# Patient Record
Sex: Female | Born: 1935 | Race: White | Hispanic: No | Smoking: Never smoker
Health system: Southern US, Community
[De-identification: ages and names within clinical notes are randomized; demographics above are authoritative.]

## PROBLEM LIST (undated history)

## (undated) DIAGNOSIS — K5790 Diverticulosis of intestine, part unspecified, without perforation or abscess without bleeding: Secondary | ICD-10-CM

## (undated) DIAGNOSIS — J302 Other seasonal allergic rhinitis: Secondary | ICD-10-CM

## (undated) DIAGNOSIS — I10 Essential (primary) hypertension: Secondary | ICD-10-CM

## (undated) DIAGNOSIS — M810 Age-related osteoporosis without current pathological fracture: Secondary | ICD-10-CM

## (undated) DIAGNOSIS — C801 Malignant (primary) neoplasm, unspecified: Secondary | ICD-10-CM

## (undated) DIAGNOSIS — H409 Unspecified glaucoma: Secondary | ICD-10-CM

## (undated) HISTORY — DX: Age-related osteoporosis without current pathological fracture: M81.0

## (undated) HISTORY — DX: Essential (primary) hypertension: I10

## (undated) HISTORY — PX: CATARACT EXTRACTION, BILATERAL: SHX1313

## (undated) HISTORY — PX: SKIN CANCER EXCISION: SHX779

## (undated) HISTORY — DX: Other seasonal allergic rhinitis: J30.2

## (undated) HISTORY — DX: Diverticulosis of intestine, part unspecified, without perforation or abscess without bleeding: K57.90

## (undated) HISTORY — DX: Malignant (primary) neoplasm, unspecified: C80.1

## (undated) HISTORY — DX: Unspecified glaucoma: H40.9

---
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
## 1968-06-19 HISTORY — PX: BREAST EXCISIONAL BIOPSY: SUR124

## 2004-04-08 ENCOUNTER — Ambulatory Visit: Payer: Self-pay | Admitting: Unknown Physician Specialty

## 2004-07-15 ENCOUNTER — Ambulatory Visit: Payer: Self-pay | Admitting: Internal Medicine

## 2005-08-17 ENCOUNTER — Ambulatory Visit: Payer: Self-pay | Admitting: Internal Medicine

## 2006-09-20 ENCOUNTER — Ambulatory Visit: Payer: Self-pay | Admitting: Internal Medicine

## 2007-09-24 ENCOUNTER — Ambulatory Visit: Payer: Self-pay | Admitting: Internal Medicine

## 2008-10-20 ENCOUNTER — Ambulatory Visit: Payer: Self-pay | Admitting: Internal Medicine

## 2008-11-02 ENCOUNTER — Ambulatory Visit: Payer: Self-pay | Admitting: Internal Medicine

## 2009-06-09 ENCOUNTER — Ambulatory Visit: Payer: Self-pay | Admitting: Unknown Physician Specialty

## 2009-12-15 ENCOUNTER — Ambulatory Visit: Payer: Self-pay | Admitting: Internal Medicine

## 2011-01-18 ENCOUNTER — Ambulatory Visit: Payer: Self-pay | Admitting: Internal Medicine

## 2012-05-03 ENCOUNTER — Ambulatory Visit: Payer: Self-pay

## 2012-08-23 ENCOUNTER — Ambulatory Visit: Payer: Self-pay | Admitting: Internal Medicine

## 2012-09-24 ENCOUNTER — Encounter: Payer: Self-pay | Admitting: Internal Medicine

## 2012-09-24 ENCOUNTER — Ambulatory Visit (INDEPENDENT_AMBULATORY_CARE_PROVIDER_SITE_OTHER): Payer: Medicare Other | Admitting: Internal Medicine

## 2012-09-24 VITALS — BP 190/100 | HR 87 | Temp 98.0°F | Ht 63.16 in | Wt 121.0 lb

## 2012-09-24 DIAGNOSIS — H409 Unspecified glaucoma: Secondary | ICD-10-CM

## 2012-09-24 DIAGNOSIS — I10 Essential (primary) hypertension: Secondary | ICD-10-CM

## 2012-09-24 DIAGNOSIS — Z800 Family history of malignant neoplasm of digestive organs: Secondary | ICD-10-CM

## 2012-09-24 DIAGNOSIS — M810 Age-related osteoporosis without current pathological fracture: Secondary | ICD-10-CM

## 2012-09-24 MED ORDER — LISINOPRIL 10 MG PO TABS: 10.0000 mg | ORAL_TABLET | Freq: Every day | ORAL | Status: DC

## 2012-09-29 ENCOUNTER — Encounter: Payer: Self-pay | Admitting: Internal Medicine

## 2012-09-29 DIAGNOSIS — I10 Essential (primary) hypertension: Secondary | ICD-10-CM | POA: Insufficient documentation

## 2012-09-29 DIAGNOSIS — M810 Age-related osteoporosis without current pathological fracture: Secondary | ICD-10-CM | POA: Insufficient documentation

## 2012-09-29 DIAGNOSIS — H409 Unspecified glaucoma: Secondary | ICD-10-CM | POA: Insufficient documentation

## 2012-09-29 DIAGNOSIS — Z800 Family history of malignant neoplasm of digestive organs: Secondary | ICD-10-CM | POA: Insufficient documentation

## 2012-09-29 NOTE — Assessment & Plan Note (Signed)
Off fosamax.  Check vitamin D level.   

## 2012-09-29 NOTE — Assessment & Plan Note (Signed)
Followed by Dr Bell.  On timolol.   

## 2012-09-29 NOTE — Assessment & Plan Note (Signed)
Had her colonoscopy 2010.  Recommended follow up colonoscopy 2015.

## 2012-09-29 NOTE — Assessment & Plan Note (Signed)
Blood pressure as outlined.  Elevated today.  Outside checks as outlined.  Start lisinopril 10mg  q day.  Renal function normal 9/13.  Recheck metabolic panel in 10 days.  Follow pressures closely.  Get her back in soon to reassess.

## 2012-09-29 NOTE — Progress Notes (Signed)
Subjective:    Patient ID: KENYA KOOK, female    DOB: 1931-10-05, 79 y.o.   MRN: [id number]  HPI 79 year old female with past history of hypertension who comes in today to establish care.  She was previously seeing Dr Lunette Stands and Dr Sampson Goon.  She is Steward Drone Brogden's sister.  She stays active.  Denies any chest pain or tightness with increased activity or exertion.  Breathing stable.  Has had a colonoscopy.  No polyps.  Had hemorrhoids and diverticulosis.  Intermittently will have stomach cramps.  Clears.  This is rare that this occurs.  Has seen GI.  Was told she had a "sharp turn" in her colon and that this could be the etiology of the intermittent discomfort.  Bowels stable.  Sees Dr Alvester Morin and on Timolol drops for her glaucoma.  Lives by herself.  Husband died at age 70 of heart disease.  Has a granddaughter that is bipolar.  She is now in [place].  Increased stress related to this.  Feels she is handling things relatively well.     Past Medical History  Diagnosis Date  . Hypertension   . Osteoporosis   . Cancer     squamous cell skin  . Seasonal allergies   . Glaucoma   . Diverticulosis     Current Outpatient Prescriptions on File Prior to Visit  Medication Sig Dispense Refill  . aspirin EC 81 MG tablet Take 81 mg by mouth daily.      Marland Kitchen atenolol (TENORMIN) 25 MG tablet Take 25 mg by mouth daily.      . busPIRone (BUSPAR) 5 MG tablet Take 5 mg by mouth daily as needed.       . Calcium Carbonate-Vitamin D 600-400 MG-UNIT per tablet Take 1 tablet by mouth daily.       . timolol (BETIMOL) 0.5 % ophthalmic solution Place 1 drop into both eyes 2 (two) times daily.       No current facility-administered medications on file prior to visit.    Review of Systems Patient denies any headache, lightheadedness or dizziness.  No sinus or allergy symptoms.   No chest pain, tightness or palpitations.  No increased shortness of breath, cough or congestion.  No acid reflux.  No nausea or  vomiting.  No abdominal pain or cramping.  No bowel change, such as diarrhea, constipation, BRBPR or melana.  Some increased urinary pressure at times.  Blood pressure averaging 140s/72080.  Previously took fosamax.  Off now.  Has documented osteoporosis.  Increased stress as outlined.  Feels she is handling things relatively well.  Does not feel she needs any further intervention.      Objective:   Physical Exam Filed Vitals:   09/24/12 0952  BP: 190/100  Pulse: 87  Temp: 98 F (36.7 C)   Blood pressure recheck:  6-70/52  79 year old female in no acute distress.   HEENT:  Nares- clear.  Oropharynx - without lesions. NECK:  Supple.  Nontender.  No audible bruit.  HEART:  Appears to be regular. LUNGS:  No crackles or wheezing audible.  Respirations even and unlabored.  RADIAL PULSE:  Equal bilaterally.     ABDOMEN:  Soft, nontender.  Bowel sounds present and normal.  No audible abdominal bruit.   EXTREMITIES:  No increased edema present.  DP pulses palpable and equal bilaterally.      SKIN:  No rash.   NEURO:  No focal neurological deficit noted.  Assessment & Plan:  INCREASED PSYCHOSOCIAL STRESSORS.  Feels she is handling things relatively well.  Desires no further intervention at this time.  Follow.  HEALTH MAINTENANCE.  Schedule a physical when due.  Colonoscopy as outlined.  Schedule mammogram when due.  Obtain records for review.    I Spent 45 minutes with this pt and more than 50% of the time was spent in consultation regarding the above.

## 2012-10-04 ENCOUNTER — Encounter: Payer: Self-pay | Admitting: Internal Medicine

## 2012-10-04 DIAGNOSIS — Z800 Family history of malignant neoplasm of digestive organs: Secondary | ICD-10-CM

## 2012-10-07 ENCOUNTER — Other Ambulatory Visit (INDEPENDENT_AMBULATORY_CARE_PROVIDER_SITE_OTHER): Payer: Medicare Other

## 2012-10-07 DIAGNOSIS — I10 Essential (primary) hypertension: Secondary | ICD-10-CM

## 2012-10-07 DIAGNOSIS — M810 Age-related osteoporosis without current pathological fracture: Secondary | ICD-10-CM

## 2012-10-07 LAB — BASIC METABOLIC PANEL
CO2: 28 mEq/L (ref 19–32)
Chloride: 103 mEq/L (ref 96–112)
Sodium: 138 mEq/L (ref 135–145)

## 2012-10-08 ENCOUNTER — Encounter: Payer: Self-pay | Admitting: *Deleted

## 2012-10-08 LAB — VITAMIN D 25 HYDROXY (VIT D DEFICIENCY, FRACTURES): Vit D, 25-Hydroxy: 55 ng/mL (ref 30–89)

## 2012-10-18 ENCOUNTER — Ambulatory Visit (INDEPENDENT_AMBULATORY_CARE_PROVIDER_SITE_OTHER): Payer: Medicare Other | Admitting: Internal Medicine

## 2012-10-18 ENCOUNTER — Encounter: Payer: Self-pay | Admitting: Internal Medicine

## 2012-10-18 VITALS — BP 140/100 | HR 83 | Temp 97.6°F | Ht 63.16 in | Wt 121.0 lb

## 2012-10-18 DIAGNOSIS — I10 Essential (primary) hypertension: Secondary | ICD-10-CM

## 2012-10-18 DIAGNOSIS — H409 Unspecified glaucoma: Secondary | ICD-10-CM

## 2012-10-18 DIAGNOSIS — M810 Age-related osteoporosis without current pathological fracture: Secondary | ICD-10-CM

## 2012-10-18 DIAGNOSIS — Z800 Family history of malignant neoplasm of digestive organs: Secondary | ICD-10-CM

## 2012-10-18 MED ORDER — LISINOPRIL 10 MG PO TABS: 10.0000 mg | ORAL_TABLET | Freq: Every day | ORAL | Status: DC

## 2012-10-20 ENCOUNTER — Encounter: Payer: Self-pay | Admitting: Internal Medicine

## 2012-10-20 NOTE — Assessment & Plan Note (Signed)
Had her colonoscopy 2010.  Recommended follow up colonoscopy 2015.  Bowels doing well now.  Follow.   

## 2012-10-20 NOTE — Assessment & Plan Note (Signed)
Off fosamax.  Calcium, vitamin d and weight bearing exercise.

## 2012-10-20 NOTE — Assessment & Plan Note (Signed)
Followed by Dr Bell.  On timolol.   

## 2012-10-20 NOTE — Progress Notes (Signed)
Subjective:    Patient ID: Claire Clayton, female    DOB: 13-Jan-1932, 79 y.o.   MRN: [id number]  HPI 79 year old female with past history of hypertension who comes in today for a scheduled follow up.  She is staying active.  Denies any chest pain or tightness with increased activity or exertion.  Breathing stable.  Has had a colonoscopy.  No polyps.  Had hemorrhoids and diverticulosis.  Intermittently will have stomach cramps.  Clears.  This is rare that this occurs.  Has seen GI.  Was told she had a "sharp turn" in her colon and that this could be the etiology of the intermittent discomfort.  Bowels stable.   Increased stress related to her granddaughter.  Feels she is handling things relatively well.  Last visit, I placed her on Lisinopril.  She states she is tolerating.  Blood pressure varying.  Blood pressure mostly averagng 130-150s/70-80s.  Occasional spikes.     Past Medical History  Diagnosis Date  . Hypertension   . Osteoporosis   . Cancer     squamous cell skin  . Seasonal allergies   . Glaucoma   . Diverticulosis     Current Outpatient Prescriptions on File Prior to Visit  Medication Sig Dispense Refill  . aspirin EC 81 MG tablet Take 81 mg by mouth daily.      Marland Kitchen atenolol (TENORMIN) 25 MG tablet Take 25 mg by mouth daily.      . busPIRone (BUSPAR) 5 MG tablet Take 5 mg by mouth daily as needed.       . Calcium Carbonate-Vitamin D 600-400 MG-UNIT per tablet Take 1 tablet by mouth daily.       . Cetirizine HCl (ZYRTEC ALLERGY PO) Take by mouth.      . Fish Oil-Cholecalciferol (FISH OIL + D3 PO) Take by mouth daily.      . timolol (BETIMOL) 0.5 % ophthalmic solution Place 1 drop into both eyes 2 (two) times daily.       No current facility-administered medications on file prior to visit.    Review of Systems Patient denies any headache, lightheadedness or dizziness.  No sinus or allergy symptoms.   No chest pain, tightness or palpitations.  No increased shortness of  breath, cough or congestion.  No acid reflux.  No nausea or vomiting.  No abdominal pain or cramping currently.   No bowel change, such as diarrhea, constipation, BRBPR or melana currently.   States she had a flare of diverticulitis two weeks ago.  LLQ cramping.  Watery bowel movement.  This has resolved. Having no problems now.  Blood pressure as outlined.  Previously took fosamax.  Off now.  Has documented osteoporosis.  Increased stress as outlined.  Feels she is handling things relatively well.  Does not feel she needs any further intervention.      Objective:   Physical Exam  Filed Vitals:   10/18/12 0831  BP: 140/100  Pulse: 83  Temp: 97.6 F (36.4 C)   Blood pressure recheck:  70-37/90  79 year old female in no acute distress.   HEENT:  Nares- clear.  Oropharynx - without lesions. NECK:  Supple.  Nontender.  No audible bruit.  HEART:  Appears to be regular. LUNGS:  No crackles or wheezing audible.  Respirations even and unlabored.  RADIAL PULSE:  Equal bilaterally.     ABDOMEN:  Soft, nontender.  Bowel sounds present and normal.  No audible abdominal bruit.   EXTREMITIES:  No increased edema present.  DP pulses palpable and equal bilaterally.      SKIN:  No rash.       Assessment & Plan:  INCREASED PSYCHOSOCIAL STRESSORS.  Feels she is handling things relatively well.  Desires no further intervention at this time.  Follow.  ABDOMINAL PAIN.  She relates it to a flare of her diverticulitis.  Has resolved now.  Desires no further intervention.  Will notify me of be reevaluated if reoccurs.   HEALTH MAINTENANCE.  Schedule a physical when due.  Colonoscopy as outlined.  Schedule mammogram when due.  Obtain records for review.

## 2012-10-20 NOTE — Assessment & Plan Note (Signed)
Blood pressure as outlined.  Elevated today.  Outside checks as outlined.  On lisinopril 10mg  q day.  Renal function normal 9/13.  Recheck metabolic panel wnl.  Wanted to increase the lisinopril to 20mg  q day.  She declines.  Wants to continue to monitor on the 10mg  lisinopril.  Follow.  Record readings.  Get her back in soon to reassess.

## 2012-10-28 ENCOUNTER — Telehealth: Payer: Self-pay | Admitting: Internal Medicine

## 2012-10-28 NOTE — Telephone Encounter (Signed)
Patient Information:  Caller Name: Eli  Phone: [phone]  Patient: Claire Clayton, Claire Clayton  Gender: Female  DOB: 1932/04/10  Age: 79 Years  PCP: Dale Redfield  Office Follow Up:  Does the office need to follow up with this patient?: No  Instructions For The Office: N/A   Symptoms  Reason For Call & Symptoms: She is congested and has frequent productive cough. Nasal drainage is greenish blood tinged and is draining in throat. Voice is hoarse. Afebrile. Coughing is keeping her awake at night. Took Delsym last night and helped some.  Reviewed Health History In EMR: Yes  Reviewed Medications In EMR: Yes  Reviewed Allergies In EMR: Yes  Reviewed Surgeries / Procedures: Yes  Date of Onset of Symptoms: 10/25/2012  Treatments Tried: Muscinex, Zyrtec, Saline Nose Spray, Delsym  Treatments Tried Worked: No  Guideline(s) Used:  Cough  Disposition Per Guideline:   See Today in Office  Reason For Disposition Reached:   Coughing up blood-tinged sputum/ drainage in throat  Advice Given:  Reassurance  Coughing is the way that our lungs remove irritants and mucus. It helps protect our lungs from getting pneumonia.  You can get a dry hacking cough after a chest cold. Sometimes this type of cough can last 1-3 weeks, and be worse at night.  You can also get a cough after being exposed to irritating substances like smoke, strong perfumes, and dust.  Here is some care advice that should help.  Cough Medicines:  OTC Cough Drops: Cough drops can help a lot, especially for mild coughs. They reduce coughing by soothing your irritated throat and removing that tickle sensation in the back of the throat. Cough drops also have the advantage of portability - you can carry them with you.  Home Remedy - Honey: This old home remedy has been shown to help decrease coughing at night. The adult dosage is 2 teaspoons (10 ml) at bedtime. Honey should not be given to infants under one year of age.  Caution -  Dextromethorphan:   Do not try to completely suppress coughs that produce mucus and phlegm. Remember that coughing is helpful in bringing up mucus from the lungs and preventing pneumonia.  Coughing Spasms:  Drink warm fluids. Inhale warm mist (Reason: both relax the airway and loosen up the phlegm).  Suck on cough drops or hard candy to coat the irritated throat.  Prevent Dehydration:  Drink adequate liquids.  This will help soothe an irritated or dry throat and loosen up the phlegm.  Expected Course:   The expected course depends on what is causing the cough.  Viral bronchitis (chest cold) causes a cough that lasts 1 to 3 weeks. Sometimes you may cough up lots of phlegm (sputum, mucus). The mucus can normally be white, gray, yellow, or green.  Call Back If:  Difficulty breathing  Cough lasts more than 3 weeks  Fever lasts > 3 days  You become worse.  Patient Will Follow Care Advice:  YES  Appointment Scheduled:  10/29/2012 09:15:00 Appointment Scheduled Provider:  Dale Norman  Refused to be seen at another location today/ requested appointment on 10/29/12

## 2012-10-29 ENCOUNTER — Ambulatory Visit (INDEPENDENT_AMBULATORY_CARE_PROVIDER_SITE_OTHER): Payer: Medicare Other | Admitting: Internal Medicine

## 2012-10-29 ENCOUNTER — Encounter: Payer: Self-pay | Admitting: Internal Medicine

## 2012-10-29 VITALS — BP 140/100 | HR 83 | Temp 98.0°F | Ht 63.16 in | Wt 118.5 lb

## 2012-10-29 DIAGNOSIS — I10 Essential (primary) hypertension: Secondary | ICD-10-CM

## 2012-10-29 MED ORDER — AZITHROMYCIN 250 MG PO TABS: ORAL_TABLET | ORAL | Status: DC

## 2012-10-29 NOTE — Progress Notes (Signed)
  Subjective:    Patient ID: Claire Clayton, female    DOB: [birth year], 79 y.o.   MRN: [id number]  Cough  79 year old female with past history of hypertension who comes in today as a work in with concerns regarding increased cough and congestion. States she mowed last week.  Noticed some worsening sinus issues after she mowed.  Developed laryngitis.  Now with increased cough and congestion.  Productive green mucus.  Right eye matted.  Increased drainage and sore throat. Some decreased appetite.  No fever.  No chest tightness.     Past Medical History  Diagnosis Date  . Hypertension   . Osteoporosis   . Cancer     squamous cell skin  . Seasonal allergies   . Glaucoma   . Diverticulosis     Current Outpatient Prescriptions on File Prior to Visit  Medication Sig Dispense Refill  . aspirin EC 81 MG tablet Take 81 mg by mouth daily.      Marland Kitchen atenolol (TENORMIN) 25 MG tablet Take 25 mg by mouth daily.      . busPIRone (BUSPAR) 5 MG tablet Take 5 mg by mouth daily as needed.       . Calcium Carbonate-Vitamin D 600-400 MG-UNIT per tablet Take 1 tablet by mouth daily.       . Cetirizine HCl (ZYRTEC ALLERGY PO) Take by mouth.      . Fish Oil-Cholecalciferol (FISH OIL + D3 PO) Take by mouth daily.      Marland Kitchen lisinopril (PRINIVIL,ZESTRIL) 10 MG tablet Take 1 tablet (10 mg total) by mouth daily.  30 tablet  3  . timolol (BETIMOL) 0.5 % ophthalmic solution Place 1 drop into both eyes 2 (two) times daily.       No current facility-administered medications on file prior to visit.    Review of Systems  Respiratory: Positive for cough.   Patient denies any headache, lightheadedness or dizziness.  No chest pain, tightness or palpitations.  Does report the cough and congestion as outlined.  Right eye matted. No acid reflux.  No nausea or vomiting.  No abdominal pain or cramping.  No bowel change, such as diarrhea.   Blood pressure as outlined.      Objective:   Physical Exam  Filed Vitals:   10/29/12 0943  BP: 140/100  Pulse: 83  Temp: 98 F (36.7 C)   Blood pressure recheck:  52-79/44  79 year old female in no acute distress.   HEENT:  Nares- erythematous turbinates.  Oropharynx - without lesions.  No significant tenderness to palpation.  TMs visualized - without erythema.  NECK:  Supple.  Nontender.  No audible bruit.  HEART:  Appears to be regular. LUNGS:  No crackles or wheezing audible.  Some increased cough with forced expiration.  Respirations even and unlabored.  RADIAL PULSE:  Equal bilaterally.       Assessment & Plan:  PROBABLE SINUSITIS/URI.  Treat with zpak as directed. States has taken this previously and this works well.  Saline nasal spray and Flonase as outlined.  Mucinex/robitussin as outlined.    INCREASED PSYCHOSOCIAL STRESSORS.  Feels she is handling things relatively well.  Desires no further intervention at this time.  Follow.   HEALTH MAINTENANCE.  Schedule a physical when due.  Colonoscopy as outlined.  Schedule mammogram when due.

## 2012-11-01 ENCOUNTER — Encounter: Payer: Self-pay | Admitting: Internal Medicine

## 2012-11-01 NOTE — Assessment & Plan Note (Signed)
Outside blood pressure checks are averaging 120s-140s/60-70s.  On lisinopril 10mg  q day.  She is not interested in increasing the dose.  Follow.  Keep f/u appt.

## 2012-11-29 ENCOUNTER — Ambulatory Visit: Payer: Medicare Other | Admitting: Internal Medicine

## 2012-12-12 ENCOUNTER — Encounter: Payer: Self-pay | Admitting: Internal Medicine

## 2012-12-12 ENCOUNTER — Ambulatory Visit (INDEPENDENT_AMBULATORY_CARE_PROVIDER_SITE_OTHER): Payer: Medicare Other | Admitting: Internal Medicine

## 2012-12-12 VITALS — BP 130/98 | HR 82 | Temp 98.2°F | Ht 63.16 in | Wt 122.2 lb

## 2012-12-12 DIAGNOSIS — M810 Age-related osteoporosis without current pathological fracture: Secondary | ICD-10-CM

## 2012-12-12 DIAGNOSIS — H409 Unspecified glaucoma: Secondary | ICD-10-CM

## 2012-12-12 DIAGNOSIS — I10 Essential (primary) hypertension: Secondary | ICD-10-CM

## 2012-12-12 DIAGNOSIS — Z13220 Encounter for screening for lipoid disorders: Secondary | ICD-10-CM

## 2012-12-12 DIAGNOSIS — Z800 Family history of malignant neoplasm of digestive organs: Secondary | ICD-10-CM

## 2012-12-14 ENCOUNTER — Encounter: Payer: Self-pay | Admitting: Internal Medicine

## 2012-12-14 NOTE — Assessment & Plan Note (Signed)
Had her colonoscopy 2010.  Recommended follow up colonoscopy 2015.  Bowels doing well now.  Follow.   

## 2012-12-14 NOTE — Assessment & Plan Note (Signed)
Off fosamax.  Calcium, vitamin d and weight bearing exercise.

## 2012-12-14 NOTE — Progress Notes (Signed)
Subjective:    Patient ID: Claire Clayton, female    DOB: [birth year], 79 y.o.   MRN: [id number]  HPI 79 year old female with past history of hypertension who comes in today for a scheduled follow up.  She is staying active.  Denies any chest pain or tightness with increased activity or exertion.  Breathing stable.  Has had a colonoscopy.  No polyps.  Had hemorrhoids and diverticulosis.   Bowels stable.   Increased stress related to her granddaughter.  Feels she is handling things relatively well.  She is on Lisinopril.  Tolerating.  Blood pressure mostly averagng 120-130s/60-70s.  Higher in the office.  She feels good.  No cough or congestion.  Previous respiratory symptoms have resolved.  She is seeing Dr Adolphus Birchwood.  He is following her for her left leg lesion.     Past Medical History  Diagnosis Date  . Hypertension   . Osteoporosis   . Cancer     squamous cell skin  . Seasonal allergies   . Glaucoma   . Diverticulosis     Current Outpatient Prescriptions on File Prior to Visit  Medication Sig Dispense Refill  . aspirin EC 81 MG tablet Take 81 mg by mouth daily.      Marland Kitchen atenolol (TENORMIN) 25 MG tablet Take 25 mg by mouth daily.      . busPIRone (BUSPAR) 5 MG tablet Take 5 mg by mouth daily as needed.       . Calcium Carbonate-Vitamin D 600-400 MG-UNIT per tablet Take 1 tablet by mouth daily.       . Cetirizine HCl (ZYRTEC ALLERGY PO) Take by mouth.      . Fish Oil-Cholecalciferol (FISH OIL + D3 PO) Take by mouth daily.      Marland Kitchen lisinopril (PRINIVIL,ZESTRIL) 10 MG tablet Take 1 tablet (10 mg total) by mouth daily.  30 tablet  3  . timolol (BETIMOL) 0.5 % ophthalmic solution Place 1 drop into both eyes 2 (two) times daily.       No current facility-administered medications on file prior to visit.    Review of Systems Patient denies any headache, lightheadedness or dizziness.  No sinus or allergy symptoms.   No chest pain, tightness or palpitations.  No increased shortness of breath,  cough or congestion.  No acid reflux.  No nausea or vomiting.  No abdominal pain or cramping currently.   No bowel change, such as diarrhea, constipation, BRBPR or melana currently.   Increased stress as outlined.  Feels she is handling things relatively well.  Has not felt she needed any further intervention.  Overall she feels good.      Objective:   Physical Exam  Filed Vitals:   12/12/12 1506  BP: 130/98  Pulse: 82  Temp: 98.2 F (36.8 C)   Blood pressure recheck:  55-42/2  79 year old female in no acute distress.   HEENT:  Nares- clear.  Oropharynx - without lesions. NECK:  Supple.  Nontender.  No audible bruit.  HEART:  Appears to be regular. LUNGS:  No crackles or wheezing audible.  Respirations even and unlabored.  RADIAL PULSE:  Equal bilaterally.     ABDOMEN:  Soft, nontender.  Bowel sounds present and normal.  No audible abdominal bruit.   EXTREMITIES:  No increased edema present.  DP pulses palpable and equal bilaterally.  Bandage over left leg.   SKIN:  No rash.       Assessment & Plan:  INCREASED PSYCHOSOCIAL  STRESSORS.  Feels she is handling things relatively well.  Has desired no further intervention.  Follow.  HEALTH MAINTENANCE.  Schedule a physical when due.  Colonoscopy as outlined.  Schedule mammogram when due.

## 2012-12-14 NOTE — Assessment & Plan Note (Signed)
Followed by Dr Bell.  On timolol.   

## 2012-12-14 NOTE — Assessment & Plan Note (Signed)
Outside blood pressure checks as outlined.  On lisinopril 10mg  q day.  She is not interested in increasing the dose.  Follow.  Keep f/u appt.  Will bring her monitor to the next visit.  Follow metabolic panel.

## 2013-03-20 ENCOUNTER — Other Ambulatory Visit (INDEPENDENT_AMBULATORY_CARE_PROVIDER_SITE_OTHER): Payer: Medicare Other

## 2013-03-20 DIAGNOSIS — I10 Essential (primary) hypertension: Secondary | ICD-10-CM

## 2013-03-20 DIAGNOSIS — Z136 Encounter for screening for cardiovascular disorders: Secondary | ICD-10-CM

## 2013-03-20 DIAGNOSIS — Z800 Family history of malignant neoplasm of digestive organs: Secondary | ICD-10-CM

## 2013-03-20 DIAGNOSIS — Z13220 Encounter for screening for lipoid disorders: Secondary | ICD-10-CM

## 2013-03-20 DIAGNOSIS — M810 Age-related osteoporosis without current pathological fracture: Secondary | ICD-10-CM

## 2013-03-20 LAB — CBC WITH DIFFERENTIAL/PLATELET
Basophils Absolute: 0 10*3/uL (ref 0.0–0.1)
Eosinophils Relative: 2.6 % (ref 0.0–5.0)
Lymphocytes Relative: 36.8 % (ref 12.0–46.0)
Monocytes Relative: 8.1 % (ref 3.0–12.0)
Neutrophils Relative %: 52.2 % (ref 43.0–77.0)
Platelets: 238 10*3/uL (ref 150.0–400.0)
RDW: 13.1 % (ref 11.5–14.6)
WBC: 6.2 10*3/uL (ref 4.5–10.5)

## 2013-03-21 LAB — COMPREHENSIVE METABOLIC PANEL
ALT: 18 U/L (ref 0–35)
Albumin: 3.8 g/dL (ref 3.5–5.2)
CO2: 30 mEq/L (ref 19–32)
GFR: 73.51 mL/min (ref >60.00)
Glucose, Bld: 109 mg/dL — ABNORMAL HIGH (ref 70–99)
Potassium: 4.9 mEq/L (ref 3.5–5.1)
Sodium: 139 mEq/L (ref 135–145)
Total Bilirubin: 0.5 mg/dL (ref 0.3–1.2)
Total Protein: 6.7 g/dL (ref 6.0–8.3)

## 2013-03-21 LAB — LIPID PANEL
HDL: 54.4 mg/dL (ref >39.00)
Total CHOL/HDL Ratio: 3
VLDL: 14.8 mg/dL (ref 0.0–40.0)

## 2013-03-21 LAB — TSH: TSH: 4.62 u[IU]/mL (ref 0.35–5.50)

## 2013-03-25 ENCOUNTER — Encounter: Payer: Self-pay | Admitting: Internal Medicine

## 2013-03-25 ENCOUNTER — Ambulatory Visit (INDEPENDENT_AMBULATORY_CARE_PROVIDER_SITE_OTHER): Payer: Medicare Other | Admitting: Internal Medicine

## 2013-03-25 VITALS — BP 182/110 | HR 74 | Temp 98.0°F | Ht 63.0 in | Wt 118.0 lb

## 2013-03-25 DIAGNOSIS — M810 Age-related osteoporosis without current pathological fracture: Secondary | ICD-10-CM

## 2013-03-25 DIAGNOSIS — Z800 Family history of malignant neoplasm of digestive organs: Secondary | ICD-10-CM

## 2013-03-25 DIAGNOSIS — H409 Unspecified glaucoma: Secondary | ICD-10-CM

## 2013-03-25 DIAGNOSIS — Z0000 Encounter for general adult medical examination without abnormal findings: Secondary | ICD-10-CM

## 2013-03-25 DIAGNOSIS — I10 Essential (primary) hypertension: Secondary | ICD-10-CM

## 2013-03-25 DIAGNOSIS — Z1211 Encounter for screening for malignant neoplasm of colon: Secondary | ICD-10-CM

## 2013-03-25 DIAGNOSIS — Z23 Encounter for immunization: Secondary | ICD-10-CM

## 2013-03-30 ENCOUNTER — Encounter: Payer: Self-pay | Admitting: Internal Medicine

## 2013-03-30 NOTE — Progress Notes (Signed)
Subjective:    Patient ID: Claire Clayton, female    DOB: 08-16-1931, 79 y.o.   MRN: [id number]  HPI 79 year old female with past history of hypertension who comes in today for her annual Medicare wellness examination and management of other chronic and acute problems.   The risk factors are reflected in the social history.  The roster of all physicians providing medical care to patient - is listed in the Snapshot section of the chart.  Activities of daily living:  The patient is 100% independent in all ADLs: dressing, toileting, feeding as well as independent mobility  Home safety :  There is no violence in the home.   There is no risks for hepatitis, STDs or HIV. There is no history of blood transfusion. They have no travel history to infectious disease endemic areas of the world.  The patient has seen their dentist in the last six month. They do not  have excessive sun exposure.   Diet: the importance of a healthy diet is discussed.   The benefits of regular aerobic exercise were discussed. She exercises regularly.   Depression screen: there are no signs or vegative symptoms of depression- irritability, change in appetite, anhedonia, sadness/tearfullness.  Cognitive assessment: the patient manages all their financial and personal affairs and is actively engaged. They could relate day,date,year and events; recalled 3/3 objects at 5 minutes.  The following portions of the patient's history were reviewed and updated as appropriate: allergies, current medications, past family history, past medical history,  past surgical history, past social history  and problem list.  Hearing and body mass index were assessed and reviewed.   During the course of the visit the patient was educated and counseled about appropriate screening and preventive services including : colorectal cancer screening, and recommended immunizations.    She is staying active.  Denies any chest pain or tightness  with increased activity or exertion.  Breathing stable.  Has had a colonoscopy.  No polyps.  Had hemorrhoids and diverticulosis.   Bowels stable.   Increased stress related to her granddaughter.  Feels she is handling things relatively well.  She is on Lisinopril.  Tolerating.  Blood pressure mostly averagng 120-130s/60-70s.  Higher in the office.  She feels good.  No cough or congestion.  She brought her blood pressure cuff to this visit.  Does correlate.     Past Medical History  Diagnosis Date  . Hypertension   . Osteoporosis   . Cancer     squamous cell skin  . Seasonal allergies   . Glaucoma   . Diverticulosis     Current Outpatient Prescriptions on File Prior to Visit  Medication Sig Dispense Refill  . aspirin EC 81 MG tablet Take 81 mg by mouth daily.      Marland Kitchen atenolol (TENORMIN) 25 MG tablet Take 25 mg by mouth daily.      . busPIRone (BUSPAR) 5 MG tablet Take 5 mg by mouth daily as needed.       . Calcium Carbonate-Vitamin D 600-400 MG-UNIT per tablet Take 1 tablet by mouth daily.       . Cetirizine HCl (ZYRTEC ALLERGY PO) Take by mouth.      . Fish Oil-Cholecalciferol (FISH OIL + D3 PO) Take by mouth daily.      Marland Kitchen lisinopril (PRINIVIL,ZESTRIL) 10 MG tablet Take 1 tablet (10 mg total) by mouth daily.  30 tablet  3  . timolol (BETIMOL) 0.5 % ophthalmic solution Place 1 drop  into both eyes 2 (two) times daily.       No current facility-administered medications on file prior to visit.    Review of Systems Patient denies any headache, lightheadedness or dizziness.  No sinus or allergy symptoms.   No chest pain, tightness or palpitations.  No increased shortness of breath, cough or congestion.  No acid reflux.  No nausea or vomiting.  No abdominal pain or cramping.  No bowel change, such as diarrhea, constipation, BRBPR or melana currently.   Overall she feels good.  Has hemorrhoids.  Flare intermittently.  No bleeding.  Has been off fosamax for three years.  Had issues with her teeth.       Objective:   Physical Exam  Filed Vitals:   03/25/13 1101  BP: 182/110  Pulse: 74  Temp: 98 F (36.7 C)   Blood pressure recheck:  73-45/25  79 year old female in no acute distress.   HEENT:  Nares- clear.  Oropharynx - without lesions. NECK:  Supple.  Nontender.  No audible bruit.  HEART:  Appears to be regular. LUNGS:  No crackles or wheezing audible.  Respirations even and unlabored.  RADIAL PULSE:  Equal bilaterally.    BREASTS:  No nipple discharge or nipple retraction present.  Could not appreciate any distinct nodules or axillary adenopathy.  ABDOMEN:  Soft, nontender.  Bowel sounds present and normal.  No audible abdominal bruit.  GU: not performed.    EXTREMITIES:  No increased edema present.  DP pulses palpable and equal bilaterally.          Assessment & Plan:  INCREASED PSYCHOSOCIAL STRESSORS.  Feels she is handling things relatively well.  Has desired no further intervention.  Follow.  HEALTH MAINTENANCE.  Physical today.  Colonoscopy as outlined.  Mammogram 05/03/12 - [facility] II.  Information given to schedule mammogram when due.

## 2013-03-30 NOTE — Assessment & Plan Note (Signed)
Off fosamax.  Continue vitamin d and weight bearing exercise.  Last bone density several years ago.  Schedule follow up bone density.

## 2013-03-30 NOTE — Assessment & Plan Note (Signed)
Followed by Dr Bell.  On timolol.   

## 2013-03-30 NOTE — Assessment & Plan Note (Signed)
Had her colonoscopy 2010.  Recommended follow up colonoscopy 2015.  Bowels doing well now.  Follow.   

## 2013-03-30 NOTE — Assessment & Plan Note (Signed)
Outside blood pressure checks as outlined.  On lisinopril 10mg  q day.  She is not interested in increasing the dose.  Discussed with her today.  Her cuff does appear to correlate.  Follow pressures.  Follow metabolic panel.

## 2013-04-02 ENCOUNTER — Other Ambulatory Visit (INDEPENDENT_AMBULATORY_CARE_PROVIDER_SITE_OTHER): Payer: Medicare Other

## 2013-04-02 DIAGNOSIS — Z1211 Encounter for screening for malignant neoplasm of colon: Secondary | ICD-10-CM

## 2013-04-02 LAB — FECAL OCCULT BLOOD, IMMUNOCHEMICAL: Fecal Occult Bld: NEGATIVE

## 2013-04-03 ENCOUNTER — Encounter: Payer: Self-pay | Admitting: *Deleted

## 2013-04-15 ENCOUNTER — Other Ambulatory Visit: Payer: Self-pay | Admitting: *Deleted

## 2013-04-15 MED ORDER — LISINOPRIL 10 MG PO TABS: 10.0000 mg | ORAL_TABLET | Freq: Every day | ORAL | Status: DC

## 2013-04-17 ENCOUNTER — Other Ambulatory Visit: Payer: Self-pay | Admitting: *Deleted

## 2013-04-17 MED ORDER — ATENOLOL 25 MG PO TABS: 25.0000 mg | ORAL_TABLET | Freq: Every day | ORAL | Status: DC

## 2013-05-05 ENCOUNTER — Ambulatory Visit: Payer: Self-pay | Admitting: Internal Medicine

## 2013-05-05 LAB — HM MAMMOGRAPHY: HM Mammogram: NEGATIVE

## 2013-05-06 ENCOUNTER — Encounter: Payer: Self-pay | Admitting: Internal Medicine

## 2013-06-27 ENCOUNTER — Ambulatory Visit: Payer: Medicare Other | Admitting: Internal Medicine

## 2013-08-18 ENCOUNTER — Ambulatory Visit (INDEPENDENT_AMBULATORY_CARE_PROVIDER_SITE_OTHER): Payer: Medicare Other | Admitting: Internal Medicine

## 2013-08-18 ENCOUNTER — Encounter: Payer: Self-pay | Admitting: Internal Medicine

## 2013-08-18 VITALS — HR 88 | Temp 98.4°F | Ht 63.0 in | Wt 121.5 lb

## 2013-08-18 DIAGNOSIS — I10 Essential (primary) hypertension: Secondary | ICD-10-CM

## 2013-08-18 DIAGNOSIS — H409 Unspecified glaucoma: Secondary | ICD-10-CM

## 2013-08-18 DIAGNOSIS — Z800 Family history of malignant neoplasm of digestive organs: Secondary | ICD-10-CM

## 2013-08-18 DIAGNOSIS — M810 Age-related osteoporosis without current pathological fracture: Secondary | ICD-10-CM

## 2013-08-18 LAB — BASIC METABOLIC PANEL
BUN: 13 mg/dL (ref 6–23)
CHLORIDE: 104 meq/L (ref 96–112)
CO2: 27 mEq/L (ref 19–32)
CREATININE: 0.7 mg/dL (ref 0.4–1.2)
Calcium: 9 mg/dL (ref 8.4–10.5)
GFR: 82.93 mL/min (ref >60.00)
Glucose, Bld: 94 mg/dL (ref 70–99)
Potassium: 4.4 mEq/L (ref 3.5–5.1)
Sodium: 137 mEq/L (ref 135–145)

## 2013-08-18 MED ORDER — BUSPIRONE HCL 5 MG PO TABS: 5.0000 mg | ORAL_TABLET | Freq: Every day | ORAL | Status: DC | PRN

## 2013-08-18 MED ORDER — ATENOLOL 25 MG PO TABS: 25.0000 mg | ORAL_TABLET | Freq: Every day | ORAL | Status: DC

## 2013-08-18 MED ORDER — LISINOPRIL 20 MG PO TABS: 20.0000 mg | ORAL_TABLET | Freq: Every day | ORAL | Status: DC

## 2013-08-18 NOTE — Assessment & Plan Note (Addendum)
Off fosamax.  Continue vitamin d and weight bearing exercise.  Last bone density several years ago.  Schedule a follow up bone density.

## 2013-08-18 NOTE — Progress Notes (Signed)
Subjective:    Patient ID: Claire Clayton, female    DOB: June 26, 1931, 80 y.o.   MRN: [id number]  HPI 80 year old female with past history of hypertension who comes in today for a scheduled follow up.  She is staying active.  Denies any chest pain or tightness with increased activity or exertion.  Breathing stable.  Has had a colonoscopy.  No polyps.  Had hemorrhoids and diverticulosis.   Bowels stable.   Increased stress related to her granddaughter.  Feels she is handling things relatively well.  Does not feel she needs anything more at this point.  Takes Buspar prn.  She is on Lisinopril.  Tolerating.  Blood pressure mostly averagng 120-150/60-70s.  She feels good.  No cough or congestion.      Past Medical History  Diagnosis Date  . Hypertension   . Osteoporosis   . Cancer     squamous cell skin  . Seasonal allergies   . Glaucoma   . Diverticulosis     Current Outpatient Prescriptions on File Prior to Visit  Medication Sig Dispense Refill  . aspirin EC 81 MG tablet Take 81 mg by mouth daily.      Marland Kitchen atenolol (TENORMIN) 25 MG tablet Take 1 tablet (25 mg total) by mouth daily.  30 tablet  5  . busPIRone (BUSPAR) 5 MG tablet Take 5 mg by mouth daily as needed.       . Calcium Carbonate-Vitamin D 600-400 MG-UNIT per tablet Take 1 tablet by mouth daily.       . Cetirizine HCl (ZYRTEC ALLERGY PO) Take by mouth.      . Fish Oil-Cholecalciferol (FISH OIL + D3 PO) Take by mouth daily.      Marland Kitchen lisinopril (PRINIVIL,ZESTRIL) 10 MG tablet Take 1 tablet (10 mg total) by mouth daily.  30 tablet  5  . timolol (BETIMOL) 0.5 % ophthalmic solution Place 1 drop into both eyes 2 (two) times daily.       No current facility-administered medications on file prior to visit.    Review of Systems Patient denies any headache, lightheadedness or dizziness.  No sinus or allergy symptoms.   No chest pain, tightness or palpitations.  No increased shortness of breath, cough or congestion.  No acid reflux.  No  nausea or vomiting.  No abdominal pain or cramping currently.   No bowel change, such as diarrhea, constipation, BRBPR or melana currently.   Increased stress as outlined.  Feels she is handling things relatively well.  Takes buspar prn.  Blood pressures as outlined.       Objective:   Physical Exam  Filed Vitals:   08/18/13 1120  BP: 160/110  Pulse: 88  Temp: 98.4 F (36.9 C)   Blood pressure recheck:  148-35/56-39  80 year old female in no acute distress.   HEENT:  Nares- clear.  Oropharynx - without lesions. NECK:  Supple.  Nontender.  No audible bruit.  HEART:  Appears to be regular. LUNGS:  No crackles or wheezing audible.  Respirations even and unlabored.  RADIAL PULSE:  Equal bilaterally.     ABDOMEN:  Soft, nontender.  Bowel sounds present and normal.  No audible abdominal bruit.   EXTREMITIES:  No increased edema present.  DP pulses palpable and equal bilaterally.       Assessment & Plan:  INCREASED PSYCHOSOCIAL STRESSORS.  Feels she is handling things relatively well.  Has desired no further intervention.  Follow.  Takes buspar prn.  HEALTH MAINTENANCE.  Physical 04/04/13.   Colonoscopy as outlined.  Mammogram 09/11/12 - Birads II.

## 2013-08-18 NOTE — Progress Notes (Signed)
Pre-visit discussion using our clinic review tool. No additional management support is needed unless otherwise documented below in the visit note.  

## 2013-08-18 NOTE — Assessment & Plan Note (Addendum)
Followed by Dr Bell.  On timolol.   

## 2013-08-18 NOTE — Assessment & Plan Note (Addendum)
Outside blood pressure checks as outlined.  On lisinopril 10mg  q day.  Increase lisinopril to 20mg  q day.  Her cuff does appear to correlate.  Follow pressures.  Follow metabolic panel.  Get her back in soon to reassess.

## 2013-08-19 ENCOUNTER — Encounter: Payer: Self-pay | Admitting: *Deleted

## 2013-08-21 ENCOUNTER — Encounter: Payer: Self-pay | Admitting: Internal Medicine

## 2013-08-21 NOTE — Assessment & Plan Note (Signed)
Had her colonoscopy 2010.  Recommended follow up colonoscopy 2015.  Bowels doing well now.  Follow.

## 2013-09-23 ENCOUNTER — Ambulatory Visit (INDEPENDENT_AMBULATORY_CARE_PROVIDER_SITE_OTHER): Payer: Medicare Other | Admitting: Internal Medicine

## 2013-09-23 ENCOUNTER — Encounter: Payer: Self-pay | Admitting: Internal Medicine

## 2013-09-23 VITALS — BP 144/98 | HR 91 | Temp 98.5°F | Ht 63.0 in | Wt 121.5 lb

## 2013-09-23 DIAGNOSIS — Z800 Family history of malignant neoplasm of digestive organs: Secondary | ICD-10-CM

## 2013-09-23 DIAGNOSIS — H409 Unspecified glaucoma: Secondary | ICD-10-CM

## 2013-09-23 DIAGNOSIS — I10 Essential (primary) hypertension: Secondary | ICD-10-CM

## 2013-09-23 DIAGNOSIS — M810 Age-related osteoporosis without current pathological fracture: Secondary | ICD-10-CM

## 2013-09-23 NOTE — Progress Notes (Signed)
Subjective:    Patient ID: Claire Clayton, female    DOB: 12/25/1931, 80 y.o.   MRN: [id number]  HPI 80 year old female with past history of hypertension who comes in today for a scheduled follow up.  She is staying active.  Denies any chest pain or tightness with increased activity or exertion.  Breathing stable.  Has had a colonoscopy.  No polyps.  Had hemorrhoids and diverticulosis.   Bowels stable.   Increased stress related to her granddaughter.  Feels she is handling things relatively well.  Does not feel she needs anything more at this point.  Takes Buspar prn.  She is on Lisinopril.  Taking 20mg  daily now.  Just recently started.  Tolerating.  Blood pressure mostly averagng 120-130s/60-70s.  She feels good.  No cough or congestion.      Past Medical History  Diagnosis Date  . Hypertension   . Osteoporosis   . Cancer     squamous cell skin  . Seasonal allergies   . Glaucoma   . Diverticulosis     Current Outpatient Prescriptions on File Prior to Visit  Medication Sig Dispense Refill  . aspirin EC 81 MG tablet Take 81 mg by mouth daily.      Marland Kitchen atenolol (TENORMIN) 25 MG tablet Take 1 tablet (25 mg total) by mouth daily.  30 tablet  5  . busPIRone (BUSPAR) 5 MG tablet Take 1 tablet (5 mg total) by mouth daily as needed.  30 tablet  0  . Calcium Carbonate-Vitamin D 600-400 MG-UNIT per tablet Take 1 tablet by mouth daily.       . Cetirizine HCl (ZYRTEC ALLERGY PO) Take by mouth.      . Fish Oil-Cholecalciferol (FISH OIL + D3 PO) Take by mouth daily.      Marland Kitchen lisinopril (PRINIVIL,ZESTRIL) 20 MG tablet Take 1 tablet (20 mg total) by mouth daily.  30 tablet  3  . timolol (BETIMOL) 0.5 % ophthalmic solution Place 1 drop into both eyes 2 (two) times daily.       No current facility-administered medications on file prior to visit.    Review of Systems Patient denies any headache, lightheadedness or dizziness.  No sinus or allergy symptoms.   No chest pain, tightness or palpitations.   No increased shortness of breath, cough or congestion.  No acid reflux.  No nausea or vomiting.  No abdominal pain or cramping currently.   No bowel change, such as diarrhea, constipation, BRBPR or melana currently.   Increased stress as outlined.  Feels she is handling things relatively well.  Takes buspar prn.  Blood pressures as outlined.       Objective:   Physical Exam  Filed Vitals:   09/23/13 1602  BP: 144/98  Pulse: 91  Temp: 98.5 F (36.9 C)   Blood pressure recheck:  5/30  80 year old female in no acute distress.   HEENT:  Nares- clear.  Oropharynx - without lesions. NECK:  Supple.  Nontender.  No audible bruit.  HEART:  Appears to be regular. LUNGS:  No crackles or wheezing audible.  Respirations even and unlabored.  RADIAL PULSE:  Equal bilaterally.     ABDOMEN:  Soft, nontender.  Bowel sounds present and normal.  No audible abdominal bruit.   EXTREMITIES:  No increased edema present.  DP pulses palpable and equal bilaterally.       Assessment & Plan:  INCREASED PSYCHOSOCIAL STRESSORS.  Feels she is handling things relatively well.  Has desired no further intervention.  Follow.  Takes buspar prn.    HEALTH MAINTENANCE.  Physical 04/04/13.   Colonoscopy as outlined.  Mammogram 05/05/13 - Birads I.

## 2013-09-23 NOTE — Assessment & Plan Note (Addendum)
Outside blood pressure checks as outlined.  On lisinopril 20mg q day now.   Her cuff does appear to correlate.  Follow pressures.  Follow metabolic panel.    

## 2013-09-23 NOTE — Progress Notes (Signed)
Pre-visit discussion using our clinic review tool. No additional management support is needed unless otherwise documented below in the visit note.  

## 2013-09-24 ENCOUNTER — Ambulatory Visit: Payer: Medicare Other | Admitting: Internal Medicine

## 2013-09-28 ENCOUNTER — Encounter: Payer: Self-pay | Admitting: Internal Medicine

## 2013-09-28 NOTE — Assessment & Plan Note (Signed)
Followed by Dr Bell.  On timolol.   

## 2013-09-28 NOTE — Assessment & Plan Note (Addendum)
Off fosamax.  Continue vitamin d and weight bearing exercise.  Was scheduled for a follow up bone density.  Need results.

## 2013-09-28 NOTE — Assessment & Plan Note (Signed)
Had her colonoscopy 2010.  Recommended follow up colonoscopy 2015.  Bowels doing well now.  Follow.

## 2014-01-06 ENCOUNTER — Ambulatory Visit: Payer: Medicare Other | Admitting: Internal Medicine

## 2014-03-06 ENCOUNTER — Ambulatory Visit (INDEPENDENT_AMBULATORY_CARE_PROVIDER_SITE_OTHER): Payer: Medicare Other | Admitting: Internal Medicine

## 2014-03-06 ENCOUNTER — Encounter: Payer: Self-pay | Admitting: Internal Medicine

## 2014-03-06 VITALS — BP 130/90 | HR 89 | Temp 98.6°F | Ht 63.0 in | Wt 116.8 lb

## 2014-03-06 DIAGNOSIS — Z800 Family history of malignant neoplasm of digestive organs: Secondary | ICD-10-CM

## 2014-03-06 DIAGNOSIS — H409 Unspecified glaucoma: Secondary | ICD-10-CM

## 2014-03-06 DIAGNOSIS — M810 Age-related osteoporosis without current pathological fracture: Secondary | ICD-10-CM

## 2014-03-06 DIAGNOSIS — I10 Essential (primary) hypertension: Secondary | ICD-10-CM

## 2014-03-06 MED ORDER — FIRST-DUKES MOUTHWASH MT SUSP: OROMUCOSAL | Status: DC

## 2014-03-06 NOTE — Progress Notes (Signed)
Pre visit review using our clinic review tool, if applicable. No additional management support is needed unless otherwise documented below in the visit note. 

## 2014-03-06 NOTE — Progress Notes (Signed)
Subjective:    Patient ID: Claire Clayton, female    DOB: 1932/04/16, 80 y.o.   MRN: [id number]  HPI 80 year old female with past history of hypertension who comes in today for a scheduled follow up.  She is staying active.  Denies any chest pain or tightness with increased activity or exertion.  Breathing stable.  Has had a colonoscopy.  No polyps.  Had hemorrhoids and diverticulosis.   Bowels stable.   Increased stress related to her granddaughter.  We discussed this at length.  Plans to f/u with a counselor.  Desires no other intervention at this time.   Does not feel she needs anything more at this point.  Takes Buspar prn.  She is on Lisinopril.  Taking 20mg  daily now.  Tolerating.  Blood pressure mostly averagng 120-140s/60-70s.    Past Medical History  Diagnosis Date  . Hypertension   . Osteoporosis   . Cancer     squamous cell skin  . Seasonal allergies   . Glaucoma   . Diverticulosis     Current Outpatient Prescriptions on File Prior to Visit  Medication Sig Dispense Refill  . aspirin EC 81 MG tablet Take 81 mg by mouth daily.      Marland Kitchen atenolol (TENORMIN) 25 MG tablet Take 1 tablet (25 mg total) by mouth daily.  30 tablet  5  . busPIRone (BUSPAR) 5 MG tablet Take 1 tablet (5 mg total) by mouth daily as needed.  30 tablet  0  . Calcium Carbonate-Vitamin D 600-400 MG-UNIT per tablet Take 1 tablet by mouth daily.       . Cetirizine HCl (ZYRTEC ALLERGY PO) Take by mouth.      . Fish Oil-Cholecalciferol (FISH OIL + D3 PO) Take by mouth daily.      . timolol (BETIMOL) 0.5 % ophthalmic solution Place 1 drop into both eyes 2 (two) times daily.       No current facility-administered medications on file prior to visit.    Review of Systems Patient denies any headache, lightheadedness or dizziness.  No sinus or allergy symptoms.   No chest pain, tightness or palpitations.  No increased shortness of breath, cough or congestion.  No acid reflux.  No nausea or vomiting.  No abdominal  pain or cramping currently.   No bowel change, such as diarrhea, constipation, BRBPR or melana currently.   Increased stress as outlined.  Feels she is handling things relatively well.  Takes buspar prn.  Blood pressures as outlined.       Objective:   Physical Exam  Filed Vitals:   03/06/14 1335  BP: 130/90  Pulse: 89  Temp: 98.6 F (37 C)   Blood pressure recheck:  28/68  80 year old female in no acute distress.   HEENT:  Nares- clear.  Oropharynx - without lesions. NECK:  Supple.  Nontender.  No audible bruit.  HEART:  Appears to be regular. LUNGS:  No crackles or wheezing audible.  Respirations even and unlabored.  RADIAL PULSE:  Equal bilaterally.     ABDOMEN:  Soft, nontender.  Bowel sounds present and normal.  No audible abdominal bruit.   EXTREMITIES:  No increased edema present.  DP pulses palpable and equal bilaterally.       Assessment & Plan:  INCREASED PSYCHOSOCIAL STRESSORS.  Feels she is handling things relatively well.  Has desired no further intervention.  Follow.  Takes buspar prn.    HEALTH MAINTENANCE.  Physical 04/04/13.   Colonoscopy  as outlined.  Mammogram 05/05/13 - Birads I.     I spent 25 minutes with the patient and more than 50% of the time was spent in consultation regarding the above.

## 2014-03-08 ENCOUNTER — Encounter: Payer: Self-pay | Admitting: Internal Medicine

## 2014-03-08 NOTE — Assessment & Plan Note (Addendum)
Had her colonoscopy 2010.  Recommended follow up colonoscopy 2015.  Bowels doing well now.  Follow.  IFOB 04/02/13 - negative.

## 2014-03-08 NOTE — Assessment & Plan Note (Signed)
Followed by Dr Gloriann Loan.  On timolol.

## 2014-03-08 NOTE — Assessment & Plan Note (Signed)
Outside blood pressure checks as outlined.  On lisinopril 20mg  q day now.   Her cuff does appear to correlate.  Follow pressures.  Follow metabolic panel.

## 2014-03-08 NOTE — Assessment & Plan Note (Signed)
Off fosamax.  Continue vitamin d and weight bearing exercise.

## 2014-04-13 ENCOUNTER — Other Ambulatory Visit: Payer: Self-pay | Admitting: *Deleted

## 2014-04-13 MED ORDER — ATENOLOL 25 MG PO TABS: 25.0000 mg | ORAL_TABLET | Freq: Every day | ORAL | Status: DC

## 2014-05-08 ENCOUNTER — Encounter: Payer: Self-pay | Admitting: Internal Medicine

## 2014-05-08 ENCOUNTER — Ambulatory Visit (INDEPENDENT_AMBULATORY_CARE_PROVIDER_SITE_OTHER): Payer: Medicare Other | Admitting: Internal Medicine

## 2014-05-08 ENCOUNTER — Encounter (INDEPENDENT_AMBULATORY_CARE_PROVIDER_SITE_OTHER): Payer: Self-pay

## 2014-05-08 VITALS — BP 120/90 | HR 77 | Temp 97.7°F | Ht 63.5 in | Wt 117.2 lb

## 2014-05-08 DIAGNOSIS — M810 Age-related osteoporosis without current pathological fracture: Secondary | ICD-10-CM

## 2014-05-08 DIAGNOSIS — Z658 Other specified problems related to psychosocial circumstances: Secondary | ICD-10-CM

## 2014-05-08 DIAGNOSIS — R5383 Other fatigue: Secondary | ICD-10-CM

## 2014-05-08 DIAGNOSIS — Z800 Family history of malignant neoplasm of digestive organs: Secondary | ICD-10-CM

## 2014-05-08 DIAGNOSIS — Z1239 Encounter for other screening for malignant neoplasm of breast: Secondary | ICD-10-CM

## 2014-05-08 DIAGNOSIS — Z13220 Encounter for screening for lipoid disorders: Secondary | ICD-10-CM

## 2014-05-08 DIAGNOSIS — F439 Reaction to severe stress, unspecified: Secondary | ICD-10-CM

## 2014-05-08 DIAGNOSIS — I10 Essential (primary) hypertension: Secondary | ICD-10-CM

## 2014-05-08 LAB — CBC WITH DIFFERENTIAL/PLATELET
Basophils Absolute: 0 10*3/uL (ref 0.0–0.1)
Basophils Relative: 0.5 % (ref 0.0–3.0)
Eosinophils Absolute: 0.1 10*3/uL (ref 0.0–0.7)
Eosinophils Relative: 1.9 % (ref 0.0–5.0)
HCT: 43.3 % (ref 36.0–46.0)
Hemoglobin: 14.7 g/dL (ref 12.0–15.0)
Lymphocytes Relative: 33.4 % (ref 12.0–46.0)
Lymphs Abs: 2.5 10*3/uL (ref 0.7–4.0)
MCHC: 33.9 g/dL (ref 30.0–36.0)
MCV: 95 fl (ref 78.0–100.0)
MONO ABS: 0.6 10*3/uL (ref 0.1–1.0)
Monocytes Relative: 8.5 % (ref 3.0–12.0)
Neutro Abs: 4.1 10*3/uL (ref 1.4–7.7)
Neutrophils Relative %: 55.7 % (ref 43.0–77.0)
PLATELETS: 237 10*3/uL (ref 150.0–400.0)
RBC: 4.56 Mil/uL (ref 3.87–5.11)
RDW: 13.3 % (ref 11.5–15.5)
WBC: 7.4 10*3/uL (ref 4.0–10.5)

## 2014-05-08 LAB — LIPID PANEL
CHOL/HDL RATIO: 3
Cholesterol: 186 mg/dL (ref 0–200)
HDL: 53.8 mg/dL (ref >39.00)
LDL CALC: 115 mg/dL — AB (ref 0–99)
NonHDL: 132.2
TRIGLYCERIDES: 86 mg/dL (ref 0.0–149.0)
VLDL: 17.2 mg/dL (ref 0.0–40.0)

## 2014-05-08 LAB — COMPREHENSIVE METABOLIC PANEL
ALK PHOS: 88 U/L (ref 39–117)
ALT: 21 U/L (ref 0–35)
AST: 25 U/L (ref 0–37)
Albumin: 4.1 g/dL (ref 3.5–5.2)
BUN: 17 mg/dL (ref 6–23)
CO2: 29 mEq/L (ref 19–32)
Calcium: 9.5 mg/dL (ref 8.4–10.5)
Chloride: 101 mEq/L (ref 96–112)
Creatinine, Ser: 0.9 mg/dL (ref 0.4–1.2)
GFR: 63.98 mL/min (ref >60.00)
Glucose, Bld: 99 mg/dL (ref 70–99)
POTASSIUM: 4 meq/L (ref 3.5–5.1)
SODIUM: 137 meq/L (ref 135–145)
TOTAL PROTEIN: 6.6 g/dL (ref 6.0–8.3)
Total Bilirubin: 0.7 mg/dL (ref 0.2–1.2)

## 2014-05-08 LAB — TSH: TSH: 3.59 u[IU]/mL (ref 0.35–4.50)

## 2014-05-08 LAB — VITAMIN D 25 HYDROXY (VIT D DEFICIENCY, FRACTURES): VITD: 29.56 ng/mL — ABNORMAL LOW (ref 30.00–100.00)

## 2014-05-08 NOTE — Progress Notes (Signed)
Pre visit review using our clinic review tool, if applicable. No additional management support is needed unless otherwise documented below in the visit note. 

## 2014-05-08 NOTE — Progress Notes (Signed)
Subjective:    Patient ID: Claire Clayton, female    DOB: 11-20-1931, 80 y.o.   MRN: [id number]  HPI 80 year old female with past history of hypertension who comes in today for her complete physical exam.  She is staying active.  Denies any chest pain or tightness with increased activity or exertion.  Breathing stable.  Bowels stable.   Increased stress related to her granddaughter.  We again discussed this at length.  Plans to f/u with a counselor.  Desires no other intervention at this time.   Does not feel she needs anything more at this point. She is on Lisinopril.  Taking $Remov'20mg'ZHuYlq$  daily now.  Tolerating.  Blood pressure mostly averagng 120-140s/60-70s.    Past Medical History  Diagnosis Date  . Hypertension   . Osteoporosis   . Cancer     squamous cell skin  . Seasonal allergies   . Glaucoma   . Diverticulosis     Current Outpatient Prescriptions on File Prior to Visit  Medication Sig Dispense Refill  . aspirin EC 81 MG tablet Take 81 mg by mouth daily.    Marland Kitchen atenolol (TENORMIN) 25 MG tablet Take 1 tablet (25 mg total) by mouth daily. 30 tablet 5  . busPIRone (BUSPAR) 5 MG tablet Take 1 tablet (5 mg total) by mouth daily as needed. 30 tablet 0  . Calcium Carbonate-Vitamin D 600-400 MG-UNIT per tablet Take 1 tablet by mouth daily.     . Cetirizine HCl (ZYRTEC ALLERGY PO) Take by mouth.    . Diphenhyd-Hydrocort-Nystatin (FIRST-DUKES MOUTHWASH) SUSP 10cc's swish and spit tid 237 mL 0  . Fish Oil-Cholecalciferol (FISH OIL + D3 PO) Take by mouth daily.    Marland Kitchen lisinopril (PRINIVIL,ZESTRIL) 20 MG tablet Take 10 mg by mouth daily.    . timolol (BETIMOL) 0.5 % ophthalmic solution Place 1 drop into both eyes 2 (two) times daily.     No current facility-administered medications on file prior to visit.    Review of Systems Patient denies any headache, lightheadedness or dizziness.  No sinus or allergy symptoms.   No chest pain, tightness or palpitations.  No increased shortness of breath,  cough or congestion.  No acid reflux.  No nausea or vomiting.  No abdominal pain or cramping.   No bowel change, such as diarrhea, constipation, BRBPR or melana currently.   Increased stress as outlined.  Feels she is handling things relatively well.  Blood pressures as outlined.       Objective:   Physical Exam  Filed Vitals:   05/08/14 1323  BP: 120/90  Pulse: 77  Temp: 97.7 F (36.5 C)   Blood pressure recheck: 51/35  80 year old female in no acute distress.   HEENT:  Nares- clear.  Oropharynx - without lesions. NECK:  Supple.  Nontender.  No audible bruit.  HEART:  Appears to be regular. LUNGS:  No crackles or wheezing audible.  Respirations even and unlabored.  RADIAL PULSE:  Equal bilaterally.    BREASTS:  No nipple discharge or nipple retraction present.  Could not appreciate any distinct nodules or axillary adenopathy.  ABDOMEN:  Soft, nontender.  Bowel sounds present and normal.  No audible abdominal bruit.  GU:  Not performed.   EXTREMITIES:  No increased edema present.  DP pulses palpable and equal bilaterally.      Assessment & Plan:  1. Essential hypertension, benign Blood pressure under reasonable control.  Same medications regimen.  Follow met b.  2. Osteoporosis Continue calcium and weight bearing exercise.   - Vit D  25 hydroxy (rtn osteoporosis monitoring)  3. Family history of colon cancer Colonoscopy 06/09/09 - diverticulosis and internal hemorrhoids.  4. Other fatigue Did report some fatigue.  Probably related to the increased stress.  - CBC with Differential - Comprehensive metabolic panel - TSH  5. Screening cholesterol level Low cholesterol diet.   - Lipid panel  6. Breast cancer screening - MM DIGITAL SCREENING BILATERAL; Future  7. Stress Increased stress as outlined.  Feels she is handling things relatively well.  Has desired no further intervention.  Follow.  Has buspar to take prn.    HEALTH MAINTENANCE.  Physical today.    Colonoscopy as outlined.  Mammogram 05/05/13 - Birads I.  Schedule f/u mammogram.      I spent 25 minutes with the patient and more than 50% of the time was spent in consultation regarding the above.

## 2014-05-15 ENCOUNTER — Encounter: Payer: Self-pay | Admitting: Internal Medicine

## 2014-05-15 DIAGNOSIS — F439 Reaction to severe stress, unspecified: Secondary | ICD-10-CM | POA: Insufficient documentation

## 2014-05-29 ENCOUNTER — Encounter: Payer: Self-pay | Admitting: *Deleted

## 2014-05-29 ENCOUNTER — Ambulatory Visit: Payer: Self-pay | Admitting: Internal Medicine

## 2014-05-29 LAB — HM MAMMOGRAPHY: HM MAMMO: NEGATIVE

## 2014-06-11 ENCOUNTER — Encounter: Payer: Self-pay | Admitting: Internal Medicine

## 2014-08-13 ENCOUNTER — Ambulatory Visit (INDEPENDENT_AMBULATORY_CARE_PROVIDER_SITE_OTHER): Payer: Medicare Other | Admitting: Internal Medicine

## 2014-08-13 ENCOUNTER — Encounter: Payer: Self-pay | Admitting: Internal Medicine

## 2014-08-13 VITALS — BP 146/100 | HR 75 | Temp 97.8°F | Ht 63.5 in | Wt 118.5 lb

## 2014-08-13 DIAGNOSIS — J069 Acute upper respiratory infection, unspecified: Secondary | ICD-10-CM

## 2014-08-13 DIAGNOSIS — Z800 Family history of malignant neoplasm of digestive organs: Secondary | ICD-10-CM | POA: Diagnosis not present

## 2014-08-13 DIAGNOSIS — I10 Essential (primary) hypertension: Secondary | ICD-10-CM

## 2014-08-13 DIAGNOSIS — Z658 Other specified problems related to psychosocial circumstances: Secondary | ICD-10-CM | POA: Diagnosis not present

## 2014-08-13 DIAGNOSIS — Z0000 Encounter for general adult medical examination without abnormal findings: Secondary | ICD-10-CM

## 2014-08-13 DIAGNOSIS — F439 Reaction to severe stress, unspecified: Secondary | ICD-10-CM

## 2014-08-13 NOTE — Patient Instructions (Signed)
Saline nasal spray - flush nose at least 2-3x/day  nasacort nasal spray - 2 sprays each nostril one time per day.  Do this in the evening.   Robitussin as directed.

## 2014-08-13 NOTE — Progress Notes (Signed)
Pre visit review using our clinic review tool, if applicable. No additional management support is needed unless otherwise documented below in the visit note. 

## 2014-08-14 LAB — BASIC METABOLIC PANEL
BUN: 15 mg/dL (ref 6–23)
CO2: 33 mEq/L — ABNORMAL HIGH (ref 19–32)
Calcium: 10 mg/dL (ref 8.4–10.5)
Chloride: 102 mEq/L (ref 96–112)
Creatinine, Ser: 0.81 mg/dL (ref 0.40–1.20)
GFR: 72.21 mL/min (ref >60.00)
GLUCOSE: 94 mg/dL (ref 70–99)
POTASSIUM: 4.6 meq/L (ref 3.5–5.1)
SODIUM: 139 meq/L (ref 135–145)

## 2014-08-16 ENCOUNTER — Encounter: Payer: Self-pay | Admitting: Internal Medicine

## 2014-08-16 DIAGNOSIS — J069 Acute upper respiratory infection, unspecified: Secondary | ICD-10-CM | POA: Insufficient documentation

## 2014-08-16 DIAGNOSIS — Z0000 Encounter for general adult medical examination without abnormal findings: Secondary | ICD-10-CM | POA: Insufficient documentation

## 2014-08-16 NOTE — Assessment & Plan Note (Signed)
Increased stress with her granddaughter's issues.  We discussed this at length.  She feels she is coping relatively well.  Has buspar.  Does not feel she needs anything else.  Follow.

## 2014-08-16 NOTE — Assessment & Plan Note (Signed)
Physical 05/08/14.  Colonoscopy 06/09/09 - diverticulosis and internal hemorrhoids.  Mammogram 05/29/14 - Birads I.

## 2014-08-16 NOTE — Assessment & Plan Note (Signed)
Has had symptoms for the last 10-14 days.  Green mucus productions.  No fever.  Some increased congestion.  Has improved.  No diarrhea.  Saline nasal spray, nasacort and robitussin as directed.  Follow.  Notify me if persistent symptoms or problems.

## 2014-08-16 NOTE — Assessment & Plan Note (Signed)
Blood pressure elevated here.  Checks outside of here averaging 120-130s/60-70s.  We have discussed increasing the lisinopril.  She declines.  Blood pressure does well on outside checks.  Follow pressures.  Check metabolic panel.

## 2014-08-16 NOTE — Assessment & Plan Note (Signed)
Colonoscopy 06/09/09 - diverticulosis and internal hemorrhoids.  Will need f/u colonoscopy.

## 2014-08-16 NOTE — Progress Notes (Signed)
Patient ID: Claire Clayton, female   DOB: [birth year], 81 y.o.   MRN: [id number]   Subjective:    Patient ID: Claire Clayton, female    DOB: 06/12/1932, 81 y.o.   MRN: [id number]  HPI  Patient here for a scheduled follow up.  Increased stress - persists.  Increased stress with her granddaughter's medical issues.  Overall she feels she is doing relatively well.  She reports her blood pressure has been doing relatively well on outside checks.  Blood pressures averaging 120-130s/60-70s.  She reports increased nasal congestion - some green mucus production.  No fever.  Has improved.  Taking robitussin.  No sob.  No cardiac symptoms with increased activity or exertion.    Past Medical History  Diagnosis Date  . Hypertension   . Osteoporosis   . Cancer     squamous cell skin  . Seasonal allergies   . Glaucoma   . Diverticulosis     Current Outpatient Prescriptions on File Prior to Visit  Medication Sig Dispense Refill  . aspirin EC 81 MG tablet Take 81 mg by mouth daily.    Marland Kitchen atenolol (TENORMIN) 25 MG tablet Take 1 tablet (25 mg total) by mouth daily. 30 tablet 5  . busPIRone (BUSPAR) 5 MG tablet Take 1 tablet (5 mg total) by mouth daily as needed. 30 tablet 0  . Calcium Carbonate-Vitamin D 600-400 MG-UNIT per tablet Take 1 tablet by mouth daily.     . Cetirizine HCl (ZYRTEC ALLERGY PO) Take by mouth.    . Diphenhyd-Hydrocort-Nystatin (FIRST-DUKES MOUTHWASH) SUSP 10cc's swish and spit tid 237 mL 0  . Fish Oil-Cholecalciferol (FISH OIL + D3 PO) Take by mouth daily.    Marland Kitchen lisinopril (PRINIVIL,ZESTRIL) 20 MG tablet Take 10 mg by mouth daily.    . timolol (BETIMOL) 0.5 % ophthalmic solution Place 1 drop into both eyes 2 (two) times daily.     No current facility-administered medications on file prior to visit.    Review of Systems  Constitutional: Negative for appetite change and unexpected weight change.  HENT: Positive for congestion, postnasal drip and sinus pressure.     Respiratory: Negative for cough, chest tightness and shortness of breath.   Cardiovascular: Negative for chest pain, palpitations and leg swelling.  Gastrointestinal: Negative for nausea, vomiting, abdominal pain and diarrhea.  Genitourinary: Negative for dysuria and difficulty urinating.  Skin: Negative for color change and rash.  Neurological: Negative for dizziness, light-headedness and headaches.  Psychiatric/Behavioral:       Increased stress as outlined.  Feels she is handling things relatively well.         Objective:     Blood pressure recheck:  158/90  Physical Exam  Constitutional: She appears well-developed and well-nourished. No distress.  HENT:  Nose: Nose normal.  Mouth/Throat: Oropharynx is clear and moist.  Neck: Neck supple. No thyromegaly present.  Cardiovascular: Normal rate and regular rhythm.   Pulmonary/Chest: Breath sounds normal. No respiratory distress. She has no wheezes.  Abdominal: Soft. Bowel sounds are normal. There is no tenderness.  Musculoskeletal: She exhibits no edema or tenderness.  Lymphadenopathy:    She has no cervical adenopathy.  Skin: No rash noted. No erythema.    BP 146/100 mmHg  Pulse 75  Temp(Src) 97.8 F (36.6 C) (Oral)  Ht 5' 3.5" (1.613 m)  Wt 118 lb 8 oz (53.751 kg)  BMI 20.66 kg/m2  SpO2 94% Wt Readings from Last 3 Encounters:  08/13/14 118 lb 8 oz (53.751 kg)  05/08/14 117 lb 4 oz (53.184 kg)  03/06/14 116 lb 12 oz (52.957 kg)     Lab Results  Component Value Date   WBC 7.4 05/08/2014   HGB 14.7 05/08/2014   HCT 43.3 05/08/2014   PLT 237.0 05/08/2014   GLUCOSE 94 08/13/2014   CHOL 186 05/08/2014   TRIG 86.0 05/08/2014   HDL 53.80 05/08/2014   LDLCALC 115* 05/08/2014   ALT 21 05/08/2014   AST 25 05/08/2014   NA 139 08/13/2014   K 4.6 08/13/2014   CL 102 08/13/2014   CREATININE 0.81 08/13/2014   BUN 15 08/13/2014   CO2 33* 08/13/2014   TSH 3.59 05/08/2014       Assessment & Plan:   Problem List  Items Addressed This Visit    Essential hypertension, benign    Blood pressure elevated here.  Checks outside of here averaging 120-130s/60-70s.  We have discussed increasing the lisinopril.  She declines.  Blood pressure does well on outside checks.  Follow pressures.  Check metabolic panel.        Family history of colon cancer    Colonoscopy 06/09/09 - diverticulosis and internal hemorrhoids.  Will need f/u colonoscopy.       Health care maintenance    Physical 05/08/14.  Colonoscopy 06/09/09 - diverticulosis and internal hemorrhoids.  Mammogram 05/29/14 - Birads I.        Stress    Increased stress with her granddaughter's issues.  We discussed this at length.  She feels she is coping relatively well.  Has buspar.  Does not feel she needs anything else.  Follow.        URI (upper respiratory infection)    Has had symptoms for the last 10-14 days.  Green mucus productions.  No fever.  Some increased congestion.  Has improved.  No diarrhea.  Saline nasal spray, nasacort and robitussin as directed.  Follow.  Notify me if persistent symptoms or problems.         Other Visit Diagnoses    Essential hypertension    -  Primary    Relevant Orders    Basic metabolic panel (Completed)      I spent 25 minutes with the patient and more than 50% of the time was spent in consultation regarding the above.     Einar Pheasant, MD

## 2014-08-17 ENCOUNTER — Encounter: Payer: Self-pay | Admitting: *Deleted

## 2014-08-18 DIAGNOSIS — H4011X2 Primary open-angle glaucoma, moderate stage: Secondary | ICD-10-CM | POA: Diagnosis not present

## 2014-08-24 ENCOUNTER — Other Ambulatory Visit: Payer: Self-pay | Admitting: *Deleted

## 2014-08-24 MED ORDER — LISINOPRIL 20 MG PO TABS: 10.0000 mg | ORAL_TABLET | Freq: Every day | ORAL | Status: DC

## 2014-11-09 ENCOUNTER — Other Ambulatory Visit: Payer: Self-pay | Admitting: *Deleted

## 2014-11-09 MED ORDER — ATENOLOL 25 MG PO TABS: 25.0000 mg | ORAL_TABLET | Freq: Every day | ORAL | Status: DC

## 2014-11-11 ENCOUNTER — Ambulatory Visit: Payer: Medicare Other | Admitting: Internal Medicine

## 2014-12-01 DIAGNOSIS — L570 Actinic keratosis: Secondary | ICD-10-CM | POA: Diagnosis not present

## 2014-12-01 DIAGNOSIS — D485 Neoplasm of uncertain behavior of skin: Secondary | ICD-10-CM | POA: Diagnosis not present

## 2014-12-01 DIAGNOSIS — C801 Malignant (primary) neoplasm, unspecified: Secondary | ICD-10-CM | POA: Diagnosis not present

## 2014-12-01 DIAGNOSIS — L309 Dermatitis, unspecified: Secondary | ICD-10-CM | POA: Diagnosis not present

## 2015-01-11 ENCOUNTER — Ambulatory Visit (INDEPENDENT_AMBULATORY_CARE_PROVIDER_SITE_OTHER): Payer: Medicare Other | Admitting: Internal Medicine

## 2015-01-11 ENCOUNTER — Encounter: Payer: Self-pay | Admitting: Internal Medicine

## 2015-01-11 VITALS — BP 144/68 | HR 70 | Temp 97.4°F | Resp 14 | Ht 63.5 in | Wt 118.0 lb

## 2015-01-11 DIAGNOSIS — Z800 Family history of malignant neoplasm of digestive organs: Secondary | ICD-10-CM | POA: Diagnosis not present

## 2015-01-11 DIAGNOSIS — Z658 Other specified problems related to psychosocial circumstances: Secondary | ICD-10-CM | POA: Diagnosis not present

## 2015-01-11 DIAGNOSIS — I10 Essential (primary) hypertension: Secondary | ICD-10-CM | POA: Diagnosis not present

## 2015-01-11 DIAGNOSIS — Z0000 Encounter for general adult medical examination without abnormal findings: Secondary | ICD-10-CM | POA: Diagnosis not present

## 2015-01-11 DIAGNOSIS — F439 Reaction to severe stress, unspecified: Secondary | ICD-10-CM

## 2015-01-11 NOTE — Progress Notes (Signed)
Pre visit review using our clinic review tool, if applicable. No additional management support is needed unless otherwise documented below in the visit note. 

## 2015-01-11 NOTE — Progress Notes (Signed)
Patient ID: Ulla Mckiernan, female   DOB: 01-11-1932, 81 y.o.   MRN: [id number]   Subjective:    Patient ID: [name], female    DOB: 1931-11-17, 81 y.o.   MRN: [id number]  HPI  Patient here for a scheduled follow up.  She reports having some increased neck discomfort.  Started three weeks ago.  Discomfort on the left side.  Bothers her when she looks to the right.  Is better.  Discussed taking tylenol on a regular basis.  Tylenol helps.  Tries to stay active.  No cardiac symptoms with increased activity or exertion.  No sob.  No acid reflux.  Bowels stable.  Increased stress with her granddaughter.  Does not feel she needs anything more at this point.     Past Medical History  Diagnosis Date  . Hypertension   . Osteoporosis   . Cancer     squamous cell skin  . Seasonal allergies   . Glaucoma   . Diverticulosis     Outpatient Encounter Prescriptions as of 01/11/2015  Medication Sig  . aspirin EC 81 MG tablet Take 81 mg by mouth daily.  Marland Kitchen atenolol (TENORMIN) 25 MG tablet Take 1 tablet (25 mg total) by mouth daily.  . busPIRone (BUSPAR) 5 MG tablet Take 1 tablet (5 mg total) by mouth daily as needed.  . Calcium Carbonate-Vitamin D 600-400 MG-UNIT per tablet Take 1 tablet by mouth daily.   . Cetirizine HCl (ZYRTEC ALLERGY PO) Take by mouth.  . Diphenhyd-Hydrocort-Nystatin (FIRST-DUKES MOUTHWASH) SUSP 10cc's swish and spit tid  . Fish Oil-Cholecalciferol (FISH OIL + D3 PO) Take by mouth daily.  Marland Kitchen lisinopril (PRINIVIL,ZESTRIL) 20 MG tablet Take 0.5 tablets (10 mg total) by mouth daily.  . Multiple Vitamins-Minerals (ICAPS PO) Take by mouth.  . timolol (BETIMOL) 0.5 % ophthalmic solution Place 1 drop into both eyes 2 (two) times daily.   No facility-administered encounter medications on file as of 01/11/2015.    Review of Systems  Constitutional: Negative for appetite change and unexpected weight change.  HENT: Negative for congestion and sinus pressure.     Respiratory: Negative for cough, chest tightness and shortness of breath.   Cardiovascular: Negative for chest pain, palpitations and leg swelling.  Gastrointestinal: Negative for nausea, vomiting, abdominal pain and diarrhea.  Skin: Negative for color change and rash.  Neurological: Negative for dizziness, light-headedness and headaches.  Psychiatric/Behavioral: Negative for dysphoric mood and agitation.       Objective:     Blood pressure recheck:  144/68  Physical Exam  Constitutional: She appears well-developed and well-nourished. No distress.  HENT:  Nose: Nose normal.  Mouth/Throat: Oropharynx is clear and moist.  Neck: Neck supple. No thyromegaly present.  Cardiovascular: Normal rate and regular rhythm.   Pulmonary/Chest: Breath sounds normal. No respiratory distress. She has no wheezes.  Abdominal: Soft. Bowel sounds are normal. There is no tenderness.  Musculoskeletal: She exhibits no edema or tenderness.  Lymphadenopathy:    She has no cervical adenopathy.  Skin: No rash noted. No erythema.  Psychiatric: She has a normal mood and affect. Her behavior is normal.    BP 144/68 mmHg  Pulse 70  Temp(Src) 97.4 F (36.3 C) (Oral)  Resp 14  Ht 5' 3.5" (1.613 m)  Wt 118 lb (53.524 kg)  BMI 20.57 kg/m2  SpO2 97% Wt Readings from Last 3 Encounters:  01/11/15 118 lb (53.524 kg)  08/13/14 118 lb 8 oz (53.751 kg)  05/08/14 117 lb 4 oz (  53.184 kg)     Lab Results  Component Value Date   WBC 7.4 05/08/2014   HGB 14.7 05/08/2014   HCT 43.3 05/08/2014   PLT 237.0 05/08/2014   GLUCOSE 94 08/13/2014   CHOL 186 05/08/2014   TRIG 86.0 05/08/2014   HDL 53.80 05/08/2014   LDLCALC 115* 05/08/2014   ALT 21 05/08/2014   AST 25 05/08/2014   NA 139 08/13/2014   K 4.6 08/13/2014   CL 102 08/13/2014   CREATININE 0.81 08/13/2014   BUN 15 08/13/2014   CO2 33* 08/13/2014   TSH 3.59 05/08/2014       Assessment & Plan:   Problem List Items Addressed This Visit     Essential hypertension, benign - Primary    Blood pressure as outlined.  Elevated here.  Blood pressure readings average 120-130s/60-70s.  Continue same medication regimen.  Follow pressures.  Follow metabolic panel.        Family history of colon cancer    Colonoscopy 06/09/09 - diverticulosis and internal hemorrhoids.       Health care maintenance    Physical 05/08/14.  Colonoscopy 06/09/09 - diverticulosis and internal hemorrhoids.  Mammogram 05/29/14 - Birads I.       Stress    Increased stress with her granddaughter's issues.  Does not feel needs any further intervention.  Follow.           Einar Pheasant, MD

## 2015-01-13 ENCOUNTER — Encounter: Payer: Self-pay | Admitting: Internal Medicine

## 2015-01-13 NOTE — Assessment & Plan Note (Signed)
Physical 05/08/14.  Colonoscopy 06/09/09 - diverticulosis and internal hemorrhoids.  Mammogram 05/29/14 - Birads I.

## 2015-01-13 NOTE — Assessment & Plan Note (Signed)
Blood pressure as outlined.  Elevated here.  Blood pressure readings average 120-130s/60-70s.  Continue same medication regimen.  Follow pressures.  Follow metabolic panel.

## 2015-01-13 NOTE — Assessment & Plan Note (Signed)
Colonoscopy 06/09/09 - diverticulosis and internal hemorrhoids.

## 2015-01-13 NOTE — Assessment & Plan Note (Signed)
Increased stress with her granddaughter's issues.  Does not feel needs any further intervention.  Follow.

## 2015-01-27 DIAGNOSIS — H4011X2 Primary open-angle glaucoma, moderate stage: Secondary | ICD-10-CM | POA: Diagnosis not present

## 2015-02-15 ENCOUNTER — Telehealth: Payer: Self-pay | Admitting: Internal Medicine

## 2015-02-15 DIAGNOSIS — M542 Cervicalgia: Secondary | ICD-10-CM

## 2015-02-15 NOTE — Telephone Encounter (Signed)
Pt called about her neck has not gotten better. Pt was told if it has not gotten better to call so she can be referred to get an xray. Thank You!

## 2015-02-15 NOTE — Telephone Encounter (Signed)
Order placed for c-spine xray to be done at the hospital.  Please notify pt.

## 2015-02-23 ENCOUNTER — Ambulatory Visit
Admission: RE | Admit: 2015-02-23 | Discharge: 2015-02-23 | Disposition: A | Discharge status: Home / Self Care | Payer: Medicare Other | Source: Ambulatory Visit | Attending: Internal Medicine | Admitting: Internal Medicine

## 2015-02-23 DIAGNOSIS — M542 Cervicalgia: Secondary | ICD-10-CM | POA: Insufficient documentation

## 2015-02-23 DIAGNOSIS — M5032 Other cervical disc degeneration, mid-cervical region: Secondary | ICD-10-CM | POA: Insufficient documentation

## 2015-02-25 ENCOUNTER — Other Ambulatory Visit: Payer: Self-pay | Admitting: Internal Medicine

## 2015-02-25 DIAGNOSIS — M542 Cervicalgia: Secondary | ICD-10-CM

## 2015-02-25 NOTE — Progress Notes (Signed)
Order placed for physical therapy referral.

## 2015-03-03 ENCOUNTER — Ambulatory Visit: Payer: Medicare Other | Attending: Internal Medicine | Admitting: Physical Therapy

## 2015-03-03 DIAGNOSIS — R29898 Other symptoms and signs involving the musculoskeletal system: Secondary | ICD-10-CM | POA: Diagnosis not present

## 2015-03-03 DIAGNOSIS — M542 Cervicalgia: Secondary | ICD-10-CM | POA: Insufficient documentation

## 2015-03-03 NOTE — Patient Instructions (Signed)
All exercises provided were adapted from [email or web address]. Patient was provided a written handout with pictures as described. Any additional cues were manually entered in to handout and copied in to this document.  Lateral Posterior Neck Stretch  Place hand out on bed or chair. Look at arm armpit and place opposite hand on head and apply slight overpressure.   Shoulder Extension with Theraband  Start with a little tension on the thera band, arms straight, and about 45 degrees out from your body. Keeping arms straight, bring arms back and down towards your sides. Return to start.    Thoracic Extension  Sitting on a chair with the top hitting your mid back, place your hands crossed on your shoulders.  Slowly tilt back so you are stretching your mid back.  Hold for 20-30 seconds.  Release and repeat.

## 2015-03-04 NOTE — Therapy (Signed)
[facility] [address] [place], [address] Phone: [phone]   Fax:  [phone]  Physical Therapy Evaluation  Patient Details  Name: Claire Clayton MRN: [id number] Date of Birth: August 13, 1931 Referring Provider:  Einar Pheasant, MD  Encounter Date: 03/03/2015      PT End of Session - 03/03/15 1514    Visit Number 1   Number of Visits 9   Date for PT Re-Evaluation 04/14/15   PT Start Time 1400   PT Stop Time 1510   PT Time Calculation (min) 70 min   Activity Tolerance Patient tolerated treatment well   Behavior During Therapy [facility] for tasks assessed/performed      Past Medical History  Diagnosis Date  . Hypertension   . Osteoporosis   . Cancer     squamous cell skin  . Seasonal allergies   . Glaucoma   . Diverticulosis     Past Surgical History  Procedure Laterality Date  . Breast biopsy  1970    benign  . Tonsillectomy  1948  . Cataract extraction, bilateral    . Skin cancer excision      carcinoma (3 surgeries)    There were no vitals filed for this visit.  Visit Diagnosis:  Cervicalgia - Plan: PT plan of care cert/re-cert  Decreased range of motion of neck - Plan: PT plan of care cert/re-cert      Subjective Assessment - 03/03/15 1439    Subjective Patient reports she was performing a lot of yardwork and household activities (weed-eating, mowing) this spring as she still maintains her house and a beach house. Around this time she began developing left sided neck pain with movement, which progressively increases throughout the day. She reports a "popping" noise sensation with cervical rotations particularly to the right. She points to her scalenes when asked to identify her tender area. She denies sleep disturbances, numbness, weakness, or tingling in either UE. Patient denies any changes in bowel/bladder freuqnecy or control.    Limitations House hold activities   Diagnostic  tests X-Rays indicating DDD, possible underlying arthritis (multi-level)    Patient Stated Goals To return to her previous yardwork activities.    Currently in Pain? Yes   Pain Score 3    Pain Location Neck   Pain Orientation Left   Pain Descriptors / Indicators Aching   Pain Type Chronic pain   Pain Radiating Towards Left SCM    Pain Onset More than a month ago   Pain Frequency Intermittent   Aggravating Factors  Turning her head to the right and lifting objects.    Pain Relieving Factors Rest and "Miracle pain cream"    Effect of Pain on Daily Activities Has electively decreased her house work activities.             [facility] PT Assessment - 03/03/15 1525    Assessment   Medical Diagnosis --  Neck Pain   Onset Date/Surgical Date 08/31/14   Prior Therapy --  Pain medications like Tylenol.   Precautions   Precautions None   Restrictions   Weight Bearing Restrictions No   Balance Screen   Has the patient fallen in the past 6 months No   Has the patient had a decrease in activity level because of a fear of falling?  --  Yes, house work decreased   Is the patient reluctant to leave their home because of a fear of falling?  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    Cognition   Overall Cognitive Status Within Functional Limits for tasks assessed   Observation/Other Assessments   Neck Disability Index  28   Sensation   Light Touch Appears Intact   AROM   Overall AROM Comments --  UE WNL bilaterally, all cervical WNL, R Rotation 25% decreas   Strength   Overall Strength Comments --  All UE testing RTC, shoulder flexion, elbow flexion/ext 5/5       Manual techniques Soft tissue mobilization provided to scalenes on L side, reduced symptoms palpation revealed mild tenderness over muscle bellies  1st rib mobilization (P-A)  grade II-III provided significant relief on L side Thoracic P-A mobilizations provided grade II provided significant relief, mild restrictions noted in thoracic extension Distraction in supine grade I-II, provided mild relief of symptoms, progressed with side flexion which did not seem to provide additional relief   TherEx  Lower trap activation in standing bilaterally isometric contractions to table (shoulder extension) x 10 with 5 second holds, mild reduction in symptoms.  Thoracic extensions over back of chair x 12 with 5 second holds which reduced symptoms  Low rows 2 sets x8 with cuing for maintaining elbows in extension and retracting scapulae  Posterior neck stretching (nose to arm pit) for scalenes 2 sets x 8 repetitions with 5-10 second holds (reproduced "pulling" sensation in tender area)   AROM of R rotation, indicated poor movement pattern with substitution of cervical extension to aid in additional rotation. Provided PNF (contract relax) stretching bilaterally for increased rotation x ~1 minute. She felt "more loose" after these, though temporary increase in symptoms noted.                      PT Education - 03/03/15 1513    Education provided Yes   Education Details Patient eductaed on musculo-skeletal source of pain, to continue to decrease her house activities, and provided with a HEP.    Person(s) Educated Patient   Methods Explanation;Demonstration;Handout   Comprehension Verbalized understanding;Returned demonstration             PT Long Term Goals - 03/03/15 1521    PT LONG TERM GOAL #1   Title Patient will be independent with a home exercise program to decrease pain    Status New   PT LONG TERM GOAL #2   Title Patient will report a worst pain of 2/10 with neck movement to demonstrate increased tolerance for functional activities and house work.    Baseline 5/10   Status New   PT LONG TERM GOAL #3   Title Patient will report an NDI score of  less than 19% to demonstrate increased tolerance for house hold activities   Baseline 28%    Status New   PT LONG TERM GOAL #4   Title Patient will peform household activities such as yardwork with an increase in pain of no more than 2/10 from her baseline to demonstrate increased tolerance for household work.    Baseline 5/10 and has electively decreased work at home.    Status New               Plan - 03/03/15 1514    Clinical Impression Statement Patient displays unilateral neck pain in the general region of her scalenes. It appears this  is due to compensation of the scalenes/upper trapezius as she fatigued with increased yardwork (heavily intensive on her UEs). Patient stated she felt "much looser" and reported her initial "3" VAS pain with right rotation had decreased to a 1-2 after treatment applied. Patient responded well to thoracic extensions and 1st rib mobilizations as well as SCM/scalene directed stretching. Patient noted to have slightly antalgic and decreased R rotation initially, her symptoms decreased and AROM increased with treatment today. Patient would benefit from skilled PT services to address her movement deficits and pain symptoms.   Pt will benefit from skilled therapeutic intervention in order to improve on the following deficits Pain;Decreased range of motion   Rehab Potential Good   Clinical Impairments Affecting Rehab Potential Chronicity of problem, improvement during this session.    PT Frequency 2x / week   PT Duration 4 weeks   PT Treatment/Interventions Iontophoresis 4mg /ml Dexamethasone;Moist Heat;Traction;Taping;Dry needling;Therapeutic exercise;Therapeutic activities;Manual techniques   PT Next Visit Plan Progress manually directed joint and soft tissue related treatments. Provide progressions for scalene and SCM stretching. Postural strengthening and LT activation.    PT Home Exercise Plan See patient instructions.    Consulted and Agree with Plan of  Care Patient          G-Codes - 03/21/15 1519    Functional Limitation Carrying, moving and handling objects   Carrying, Moving and Handling Objects Current Status [phone]) At least 1 percent but less than 20 percent impaired, limited or restricted   Carrying, Moving and Handling Objects Goal Status ([id number]) 0 percent impaired, limited or restricted       Problem List Patient Active Problem List   Diagnosis Date Noted  . URI (upper respiratory infection) 08/16/2014  . Health care maintenance 08/16/2014  . Stress 05/15/2014  . Essential hypertension, benign 09/29/2012  . Glaucoma 09/29/2012  . Family history of colon cancer 09/29/2012  . Osteoporosis 09/29/2012   Kerman Passey, PT, DPT    03/04/2015, 9:05 AM  [place] [facility] [address] [place], [address] Phone: [phone]   Fax:  [phone]

## 2015-03-05 ENCOUNTER — Ambulatory Visit: Payer: Medicare Other | Admitting: Physical Therapy

## 2015-03-05 DIAGNOSIS — R29898 Other symptoms and signs involving the musculoskeletal system: Secondary | ICD-10-CM | POA: Diagnosis not present

## 2015-03-05 DIAGNOSIS — M542 Cervicalgia: Secondary | ICD-10-CM

## 2015-03-05 NOTE — Therapy (Signed)
[place] [facility] [address] [place], [address] Phone: [phone]   Fax:  [phone]  Physical Therapy Treatment  Patient Details  Name: Claire Clayton MRN: [id number] Date of Birth: 1931-08-12 Referring Provider:  Einar Pheasant, MD  Encounter Date: 03/05/2015      PT End of Session - 03/05/15 0956    Visit Number 2   Number of Visits 9   Date for PT Re-Evaluation 04/14/15   PT Start Time 0910   PT Stop Time 0955   PT Time Calculation (min) 45 min   Activity Tolerance Patient tolerated treatment well   Behavior During Therapy [facility] for tasks assessed/performed      Past Medical History  Diagnosis Date  . Hypertension   . Osteoporosis   . Cancer     squamous cell skin  . Seasonal allergies   . Glaucoma   . Diverticulosis     Past Surgical History  Procedure Laterality Date  . Breast biopsy  1970    benign  . Tonsillectomy  1948  . Cataract extraction, bilateral    . Skin cancer excision      carcinoma (3 surgeries)    There were no vitals filed for this visit.  Visit Diagnosis:  Cervicalgia  Decreased range of motion of neck      Subjective Assessment - 03/05/15 0911    Subjective Patient reports she has noticed increased movement to the right with head turning and more "soreness" than pain since the last session. She has been able to complete her HEP and use heat which have helped, though she notes some difficulty with posterior neck stretching.    Limitations House hold activities   Diagnostic tests X-Rays indicating DDD, possible underlying arthritis (multi-level)    Patient Stated Goals To return to her previous yardwork activities.    Currently in Pain? Yes   Pain Score 2    Pain Location Neck   Pain Orientation Left   Pain Descriptors / Indicators Aching   Pain Type Chronic pain   Pain Radiating Towards Left side scalenes   Pain Onset More than a month ago   Aggravating  Factors  Turning her head to the right and lifting objects    Pain Relieving Factors Rest and miracle pain cream.        Manual Therapy  Distraction in supine longitudinally and with side flexion bilaterally, very well tolerated and reduced her symptoms almost completely  Soft tissue mobilization to left scalenes which reduced her symptoms  1st rib mobilization grade II-III P-A well tolerated and stated this reduced her symptoms and felt very good.  Grade I-II P-A mobilizations provided to thoracic and cervical spine, minimal if any hypomobility noted today, felt relief with this  Supine, side glides grade I-II bilaterally to mid cervical spine felt a cavitation and relief of symptoms.  Manual overpressure and contract relax provided for posterior neck stretching and rotations bilaterally. Afterwards her AROM was still reduced, though mildly to R rotation.       TherEx Standing mid rows 1 set x 12 repetitions with red t-band, 2 sets x 12 repetitions with green t-band  Standing low rows 2 sets x 12 repetitions with red t-band with manual cuing for activating middle and lower trapezius and not activating her neck musculature.  PT Long Term Goals - 03/03/15 1521    PT LONG TERM GOAL #1   Title Patient will be independent with a home exercise program to decrease pain    Status New   PT LONG TERM GOAL #2   Title Patient will report a worst pain of 2/10 with neck movement to demonstrate increased tolerance for functional activities and house work.    Baseline 5/10   Status New   PT LONG TERM GOAL #3   Title Patient will report an NDI score of less than 19% to demonstrate increased tolerance for house hold activities   Baseline 28%    Status New   PT LONG TERM GOAL #4   Title Patient will peform household activities such as yardwork with an increase in pain of no more than 2/10 from her baseline to demonstrate increased tolerance for household  work.    Baseline 5/10 and has electively decreased work at home.    Status New               Plan - 03/05/15 0956    Clinical Impression Statement Patient has responded quite well to manually directed treatments particularly 1st rib mobilizations, P-A mobilizations, side glides, and distraction. She notes no pain just "stretching" with rotation by the end of the session today. She demonstrates mild decrease in full AROM with R rotation compared to L, however this difference is reduced even within this session. Patient continues to compensate with neck musculature during rowing/pulling activities though she is able to self-correct with cuing. Patient would continue to benefit from skilled PT services to address the above deficits.    Pt will benefit from skilled therapeutic intervention in order to improve on the following deficits Pain;Decreased range of motion   Rehab Potential Good   Clinical Impairments Affecting Rehab Potential Chronicity of problem, improvement during this session.    PT Frequency 2x / week   PT Duration 4 weeks   PT Treatment/Interventions Iontophoresis 4mg /ml Dexamethasone;Moist Heat;Traction;Taping;Dry needling;Therapeutic exercise;Therapeutic activities;Manual techniques   PT Next Visit Plan Manual treatment as indicated, posterior strengthening and biofeedback for rowing/pulling exercises. Instruction in lifting/pulling mechanics.    PT Home Exercise Plan Maintained from eval    Consulted and Agree with Plan of Care Patient        Problem List Patient Active Problem List   Diagnosis Date Noted  . URI (upper respiratory infection) 08/16/2014  . Health care maintenance 08/16/2014  . Stress 05/15/2014  . Essential hypertension, benign 09/29/2012  . Glaucoma 09/29/2012  . Family history of colon cancer 09/29/2012  . Osteoporosis 09/29/2012    Kerman Passey, PT, DPT    03/05/2015, 10:02 AM  [place] [facility] [address], [place], [address] Phone: [phone]   Fax:  [phone]

## 2015-03-09 ENCOUNTER — Encounter: Payer: Medicare Other | Admitting: Physical Therapy

## 2015-03-12 ENCOUNTER — Ambulatory Visit: Payer: Medicare Other | Admitting: Physical Therapy

## 2015-03-12 DIAGNOSIS — R29898 Other symptoms and signs involving the musculoskeletal system: Secondary | ICD-10-CM | POA: Diagnosis not present

## 2015-03-12 DIAGNOSIS — M542 Cervicalgia: Secondary | ICD-10-CM

## 2015-03-12 NOTE — Therapy (Signed)
[place] [facility] [address], [place], [address] Phone: [phone]   Fax:  [phone]  Physical Therapy Treatment  Patient Details  Name: Claire Clayton MRN: [id number] Date of Birth: 1932/03/23 Referring Provider:  Einar Pheasant, MD  Encounter Date: 03/12/2015      PT End of Session - 03/12/15 1041    Visit Number 3   Number of Visits 9   Date for PT Re-Evaluation 04/14/15   PT Start Time 1030   PT Stop Time 1110   PT Time Calculation (min) 40 min   Activity Tolerance Patient tolerated treatment well   Behavior During Therapy [facility] for tasks assessed/performed      Past Medical History  Diagnosis Date  . Hypertension   . Osteoporosis   . Cancer     squamous cell skin  . Seasonal allergies   . Glaucoma   . Diverticulosis     Past Surgical History  Procedure Laterality Date  . Breast biopsy  1970    benign  . Tonsillectomy  1948  . Cataract extraction, bilateral    . Skin cancer excision      carcinoma (3 surgeries)    There were no vitals filed for this visit.  Visit Diagnosis:  Cervicalgia      Subjective Assessment - 03/12/15 1036    Subjective Pt reports she is continuing to have neck pain/aggravation. Pt reports she has been consistent with her HEP however has had to stop since she left her theraband at home.   Limitations House hold activities   Diagnostic tests X-Rays indicating DDD, possible underlying arthritis (multi-level)    Patient Stated Goals To return to her previous yardwork activities.    Currently in Pain? No/denies             Objective: L 1st rib mobs 3x1 min.  STM performed on L levator, UT in area with multiple trigger points.  Following these pt had no pain with ROM and no "aggravation" of shoulder.  Reviewed and corrected low row, performed with YTB 3x10 with cuing to decr. Lumbar lordosis.  Suboccipital release performed x4 min  total.  Seated cuing for breathing with postural correction to find neutral spinal posture. Cuing to elevate shoulders with inhale, drop shoulders with exhale extensive practice with return demonstration. Pt able to feel difference in engagement in L side and feels she ill be able to address this at home.                    PT Education - 03/12/15 1037    Education provided Yes   Education Details Pt educated on breathing and relaxation strategies to minimize future pain.   Person(s) Educated Patient   Methods Explanation;Demonstration   Comprehension Verbalized understanding             PT Long Term Goals - 03/03/15 1521    PT LONG TERM GOAL #1   Title Patient will be independent with a home exercise program to decrease pain    Status New   PT LONG TERM GOAL #2   Title Patient will report a worst pain of 2/10 with neck movement to demonstrate increased tolerance for functional activities and house work.    Baseline 5/10   Status New   PT LONG TERM GOAL #3   Title Patient will report an NDI score of less than 19% to demonstrate increased tolerance for house hold activities   Baseline  28%    Status New   PT LONG TERM GOAL #4   Title Patient will peform household activities such as yardwork with an increase in pain of no more than 2/10 from her baseline to demonstrate increased tolerance for household work.    Baseline 5/10 and has electively decreased work at home.    Status New               Problem List Patient Active Problem List   Diagnosis Date Noted  . URI (upper respiratory infection) 08/16/2014  . Health care maintenance 08/16/2014  . Stress 05/15/2014  . Essential hypertension, benign 09/29/2012  . Glaucoma 09/29/2012  . Family history of colon cancer 09/29/2012  . Osteoporosis 09/29/2012    Fisher,Benjamin 03/12/2015, 11:48 AM  [place] [facility] [address] [place], [address] Phone: [phone]   Fax:  [phone]

## 2015-03-16 ENCOUNTER — Ambulatory Visit: Payer: Medicare Other | Admitting: Physical Therapy

## 2015-03-16 DIAGNOSIS — M542 Cervicalgia: Secondary | ICD-10-CM | POA: Diagnosis not present

## 2015-03-16 DIAGNOSIS — R29898 Other symptoms and signs involving the musculoskeletal system: Secondary | ICD-10-CM

## 2015-03-16 NOTE — Patient Instructions (Addendum)
   All exercises provided were adapted from [email or web address]. Patient was provided a written handout with pictures as described. Any additional cues were manually entered in to handout and copied in to this document.  SIT TO STAND - NO HANDS (8 times, 3 sets per day)   Start by sitting in a chair. Next, raise up to standing without using your hands for support.

## 2015-03-16 NOTE — Therapy (Signed)
[place] [facility] [address] [place], [address] Phone: [phone]   Fax:  [phone]  Physical Therapy Treatment  Patient Details  Name: Claire Clayton MRN: [id number] Date of Birth: 1931-09-20 Referring Provider:  Einar Pheasant, MD  Encounter Date: 03/16/2015      PT End of Session - 03/16/15 1515    Visit Number 4   Number of Visits 9   Date for PT Re-Evaluation 04/14/15   PT Start Time 1300   PT Stop Time 1345   PT Time Calculation (min) 45 min   Activity Tolerance Patient tolerated treatment well   Behavior During Therapy [facility] for tasks assessed/performed      Past Medical History  Diagnosis Date  . Hypertension   . Osteoporosis   . Cancer     squamous cell skin  . Seasonal allergies   . Glaucoma   . Diverticulosis     Past Surgical History  Procedure Laterality Date  . Breast biopsy  1970    benign  . Tonsillectomy  1948  . Cataract extraction, bilateral    . Skin cancer excision      carcinoma (3 surgeries)    There were no vitals filed for this visit.  Visit Diagnosis:  Cervicalgia  Decreased range of motion of neck      Subjective Assessment - 03/16/15 1301    Subjective Patient reports she has been dilgent with her HEP including the breathing exercise given (elevating her L shoulder), she has modified overpressure stretching with just AROM for stretching her levator. Reports some residual soreness but no pain today, reports she has been making progress thus far.    Limitations House hold activities   Diagnostic tests X-Rays indicating DDD, possible underlying arthritis (multi-level)    Patient Stated Goals To return to her previous yardwork activities.    Currently in Pain? --  "Soreness not pain"       Manual Therapy   Soft tissue mobilization to supraspinatus, levator scapulae, upper trapezius with near total relief of the soreness.    TherEx   Low rows 3 x  12 with red-tband with cuing for scapular retraction and minimal humeral extension to promote LT activity (implicated in unilateral cervical pain).    Mid rows with red t-band x 12 repetitions for 2 sets    OMEGA rows x 20# for 10 reps for 3 sets, cued to have less room between chest and pad   Lifting training "squatting" with 4 and 6# weights without using cervical musculature (x 4 per weight, no increase in pain) used chair behind her and verbal cuing for maintaining her head upright and bending her knees to complete.   Pulling 3 blocks off of table at navel height by bending through LEs, pulling towards her like a row and standing up (reverse order on descent) x 4 repetitions                          PT Education - 03/16/15 1512    Education provided Yes   Education Details Squatting and lifting mechanics for household activities. Sweeping, vaccuuming, mowing mechanics to reduce stress on UE demands.    Person(s) Educated Patient   Methods Explanation;Demonstration   Comprehension Verbalized understanding;Returned demonstration             PT Long Term Goals - 03/03/15 1521    PT LONG TERM GOAL #1   Title  Patient will be independent with a home exercise program to decrease pain    Status New   PT LONG TERM GOAL #2   Title Patient will report a worst pain of 2/10 with neck movement to demonstrate increased tolerance for functional activities and house work.    Baseline 5/10   Status New   PT LONG TERM GOAL #3   Title Patient will report an NDI score of less than 19% to demonstrate increased tolerance for house hold activities   Baseline 28%    Status New   PT LONG TERM GOAL #4   Title Patient will peform household activities such as yardwork with an increase in pain of no more than 2/10 from her baseline to demonstrate increased tolerance for household work.    Baseline 5/10 and has electively decreased work at home.    Status New                Plan - 03/16/15 1515    Clinical Impression Statement Patient continues to report significantly reduced pain and increased AROM in her neck from initial encounter. Patient continues to perform lifting and moving tasks at home and was  educated on body mechanics and lifting/pulling techniques for each to reduce strain on her UEs and cervical musculature. Patient was able to perform selected pulling, lifting exercises with resistance today with no increase in symptoms. Patient continues to be limited functionally by her cervical pain, and PT is indicated to address the above deficits.    Pt will benefit from skilled therapeutic intervention in order to improve on the following deficits Pain;Decreased range of motion   Rehab Potential Good   Clinical Impairments Affecting Rehab Potential Chronicity of problem, improvement during this session.    PT Frequency 2x / week   PT Duration 4 weeks   PT Treatment/Interventions Iontophoresis 4mg /ml Dexamethasone;Moist Heat;Traction;Taping;Dry needling;Therapeutic exercise;Therapeutic activities;Manual techniques   PT Next Visit Plan Continue manual treatment as needed, functional pulling strengthening, and technique for lifting, squatting, and mowing as needed.    PT Home Exercise Plan Provided red t-band and sit to stands to begin lifting practice.    Consulted and Agree with Plan of Care Patient        Problem List Patient Active Problem List   Diagnosis Date Noted  . URI (upper respiratory infection) 08/16/2014  . Health care maintenance 08/16/2014  . Stress 05/15/2014  . Essential hypertension, benign 09/29/2012  . Glaucoma 09/29/2012  . Family history of colon cancer 09/29/2012  . Osteoporosis 09/29/2012    Kerman Passey, PT, DPT    03/16/2015, 3:32 PM  [place] [facility] [address] [place], [address] Phone: [phone]   Fax:  [phone]

## 2015-03-22 ENCOUNTER — Ambulatory Visit: Payer: Medicare Other | Attending: Internal Medicine | Admitting: Physical Therapy

## 2015-03-22 DIAGNOSIS — M542 Cervicalgia: Secondary | ICD-10-CM | POA: Diagnosis not present

## 2015-03-22 DIAGNOSIS — R29898 Other symptoms and signs involving the musculoskeletal system: Secondary | ICD-10-CM | POA: Diagnosis not present

## 2015-03-22 NOTE — Therapy (Signed)
[place] [facility] [address] [place], [address] Phone: [phone]   Fax:  [phone]  Physical Therapy Treatment  Patient Details  Name: Claire Clayton MRN: [id number] Date of Birth: 1932/06/03 Referring Provider:  Einar Pheasant, MD  Encounter Date: 03/22/2015      PT End of Session - 03/22/15 1540    Visit Number 5   Number of Visits 9   Date for PT Re-Evaluation 04/14/15   PT Start Time 9509   PT Stop Time 1430   PT Time Calculation (min) 45 min   Activity Tolerance Patient tolerated treatment well   Behavior During Therapy [facility] for tasks assessed/performed      Past Medical History  Diagnosis Date  . Hypertension   . Osteoporosis   . Cancer     squamous cell skin  . Seasonal allergies   . Glaucoma   . Diverticulosis     Past Surgical History  Procedure Laterality Date  . Breast biopsy  1970    benign  . Tonsillectomy  1948  . Cataract extraction, bilateral    . Skin cancer excision      carcinoma (3 surgeries)    There were no vitals filed for this visit.  Visit Diagnosis:  Cervicalgia  Decreased range of motion of neck      Subjective Assessment - 03/22/15 1350    Subjective Patient reports she continues to have some soreness and aggravation but not real pain. Patient reports no difficulty with her exercises.    Limitations House hold activities   Diagnostic tests X-Rays indicating DDD, possible underlying arthritis (multi-level)    Patient Stated Goals To return to her previous yardwork activities.    Currently in Pain? --  Some soreness noted still in L sided neck.      Manual Therapy  Soft tissue mobilization to levator, upper trapezius well tolerated and reduced pain with rotations.   [place] manual stretching x 10 second holds x 10 to the R with appropriate stretching sensation  (reduced pain with rotations afterwards)   Lower c-spine (1st rib) mobilizations P-A  in sitting grade II-III   TherEx OMEGA 25# x 10 repetitions bilaterally   Seated thoracic extensions x10" x 10 repetitions, with rotations to the R as well   Mid Rows with green t-band 2 sets x 10  Low Rows with green t-band 2 sets x 15  Patterning for rotations to encourage less extension and more pure rotation ROM. (Keeping chin level)                            PT Education - 03/22/15 1536    Education provided Yes   Education Details Progressed HEP, instructed patient to begin cervical stretching, and instructed her to maintain her chin level with rotations.    Person(s) Educated Patient   Methods Explanation;Demonstration;Handout   Comprehension Returned demonstration;Verbalized understanding             PT Long Term Goals - 03/03/15 1521    PT LONG TERM GOAL #1   Title Patient will be independent with a home exercise program to decrease pain    Status New   PT LONG TERM GOAL #2   Title Patient will report a worst pain of 2/10 with neck movement to demonstrate increased tolerance for functional activities and house work.    Baseline 5/10   Status New   PT LONG  TERM GOAL #3   Title Patient will report an NDI score of less than 19% to demonstrate increased tolerance for house hold activities   Baseline 28%    Status New   PT LONG TERM GOAL #4   Title Patient will peform household activities such as yardwork with an increase in pain of no more than 2/10 from her baseline to demonstrate increased tolerance for household work.    Baseline 5/10 and has electively decreased work at home.    Status New               Plan - 03/22/15 1541    Clinical Impression Statement Patient continues to make progress towards her PT goals, reporting no pain now just soreness. She displays some altered movement pattern for cervical rotation, and is able to perform ADLs at home with no increase in pain. Patient reports she is noticing decreased popping in her  neck as well.    Pt will benefit from skilled therapeutic intervention in order to improve on the following deficits Pain;Decreased range of motion   Rehab Potential Good   Clinical Impairments Affecting Rehab Potential Chronicity of problem, improvement during this session.    PT Frequency 2x / week   PT Duration 4 weeks   PT Treatment/Interventions Iontophoresis 4mg /ml Dexamethasone;Moist Heat;Traction;Taping;Dry needling;Therapeutic exercise;Therapeutic activities;Manual techniques   PT Next Visit Plan Manual treatment and cervical stretching as needed. ADL performance and technique, particularly lifting and pulling.    PT Home Exercise Plan See patient instructions.    Consulted and Agree with Plan of Care Patient        Problem List Patient Active Problem List   Diagnosis Date Noted  . URI (upper respiratory infection) 08/16/2014  . Health care maintenance 08/16/2014  . Stress 05/15/2014  . Essential hypertension, benign 09/29/2012  . Glaucoma 09/29/2012  . Family history of colon cancer 09/29/2012  . Osteoporosis 09/29/2012    Kerman Passey, PT, DPT    03/22/2015, 4:28 PM  [place] [facility] [address], [place], [address] Phone: [phone]   Fax:  [phone]

## 2015-03-22 NOTE — Patient Instructions (Addendum)
   All exercises provided were adapted from [email or web address]. Patient was provided a written handout with pictures as described. Any additional cues were manually entered in to handout and copied in to this document.  Thoracic Extension over Chair  Find a chair so that the back hits mid shoulder blade Place feet up on stool so legs are above horizontal Clasp hands behind neck Lean backwards to feel arch in mid spine    CERVICAL ROTATIONAL SELF MOBILIZATION  Place 2 fingers just off to the side of the center of your cervical spine at the level instructed by your therapist and turn your head towards the opposite side. Gently push forward with your fingers as you turn your head.  Lateral Posterior Neck Stretch  Place hand out on bed or chair. Look at arm armpit and place opposite hand on head and apply slight overpressure.

## 2015-03-25 ENCOUNTER — Encounter: Payer: Medicare Other | Admitting: Physical Therapy

## 2015-03-29 ENCOUNTER — Ambulatory Visit: Payer: Medicare Other | Admitting: Physical Therapy

## 2015-03-29 DIAGNOSIS — M542 Cervicalgia: Secondary | ICD-10-CM | POA: Diagnosis not present

## 2015-03-29 DIAGNOSIS — R29898 Other symptoms and signs involving the musculoskeletal system: Secondary | ICD-10-CM | POA: Diagnosis not present

## 2015-03-29 NOTE — Therapy (Signed)
[place] [facility] [address] [place], [address] Phone: [phone]   Fax:  [phone]  Physical Therapy Treatment  Patient Details  Name: Claire Clayton MRN: [id number] Date of Birth: 07-11-1931 Referring Provider:  Einar Pheasant, MD  Encounter Date: 03/29/2015      PT End of Session - 03/29/15 1425    Visit Number 5   Number of Visits 6   Date for PT Re-Evaluation 04/14/15   PT Start Time 5809   PT Stop Time 1415   PT Time Calculation (min) 30 min   Activity Tolerance Patient tolerated treatment well   Behavior During Therapy [facility] for tasks assessed/performed      Past Medical History  Diagnosis Date  . Hypertension   . Osteoporosis   . Cancer     squamous cell skin  . Seasonal allergies   . Glaucoma   . Diverticulosis     Past Surgical History  Procedure Laterality Date  . Breast biopsy  1970    benign  . Tonsillectomy  1948  . Cataract extraction, bilateral    . Skin cancer excision      carcinoma (3 surgeries)    There were no vitals filed for this visit.  Visit Diagnosis:  Cervicalgia      Subjective Assessment - 03/29/15 1348    Subjective Pt reports she is "doing pretty good" but she is still having some soreness. Reports she has reached a plateau and is not continuing to make progress with cervical spine pain despite doing her exercises.   Limitations House hold activities   Diagnostic tests X-Rays indicating DDD, possible underlying arthritis (multi-level)    Patient Stated Goals To return to her previous yardwork activities.    Currently in Pain? No/denies   Pain Onset More than a month ago             Objective:  Reviewed and corrected pt on: Cervical rotation 3x10 Cervical clavicle taps with chin with 3 sec. Holds 2x10 Sustained UT stretch 3x1 min to R. Attempted to find a way to do this without incr. Pain but unable to do so. Supine "neck ups" 3x5 with  cuing to activate DNF/chin tuck with lift.  Issued this as HEP for pt, educated pt on going to gym - pt will attempt to sign up prior to next visit.  Pt requested to end session early due to needing to pick up her great granddaughter.  Pt required minimal cuing with exercises other than neck up which pt had to be instructed in, demonstrating that pt is appropriate for d/c                    PT Education - 03/29/15 1424    Education provided Yes   Education Details New HEP and encouraged pt to begin going to the gym   Person(s) Educated Patient   Methods Explanation;Demonstration   Comprehension Verbalized understanding;Returned demonstration             PT Long Term Goals - 03/03/15 1521    PT LONG TERM GOAL #1   Title Patient will be independent with a home exercise program to decrease pain    Status New   PT LONG TERM GOAL #2   Title Patient will report a worst pain of 2/10 with neck movement to demonstrate increased tolerance for functional activities and house work.    Baseline 5/10   Status New  PT LONG TERM GOAL #3   Title Patient will report an NDI score of less than 19% to demonstrate increased tolerance for house hold activities   Baseline 28%    Status New   PT LONG TERM GOAL #4   Title Patient will peform household activities such as yardwork with an increase in pain of no more than 2/10 from her baseline to demonstrate increased tolerance for household work.    Baseline 5/10 and has electively decreased work at home.    Status New               Plan - 03/29/15 1425    Clinical Impression Statement Pt appears to have  reached a plateau, is no longer hvaing pain and would benefit from focused HEP. Scheduled followup for several weeks to allow pt to attempt to continue with self treatment but to allow for reassessment to ensure she is continuing to do well. HEP focused on DNF strength as well as continued ROM exercises to address strength.    Pt will benefit from skilled therapeutic intervention in order to improve on the following deficits Pain;Decreased range of motion   Rehab Potential Good   Clinical Impairments Affecting Rehab Potential Chronicity of problem, improvement during this session.    PT Frequency 2x / week   PT Duration 4 weeks   PT Treatment/Interventions Iontophoresis 4mg /ml Dexamethasone;Moist Heat;Traction;Taping;Dry needling;Therapeutic exercise;Therapeutic activities;Manual techniques   PT Next Visit Plan Manual treatment and cervical stretching as needed. ADL performance and technique, particularly lifting and pulling.    PT Home Exercise Plan See patient instructions.    Consulted and Agree with Plan of Care Patient        Problem List Patient Active Problem List   Diagnosis Date Noted  . URI (upper respiratory infection) 08/16/2014  . Health care maintenance 08/16/2014  . Stress 05/15/2014  . Essential hypertension, benign 09/29/2012  . Glaucoma 09/29/2012  . Family history of colon cancer 09/29/2012  . Osteoporosis 09/29/2012    Fisher,Benjamin PT DPT 03/29/2015, 2:29 PM  [place] [facility] [address], [place], [address] Phone: [phone]   Fax:  [phone]

## 2015-04-01 ENCOUNTER — Encounter: Payer: Medicare Other | Admitting: Physical Therapy

## 2015-04-08 ENCOUNTER — Other Ambulatory Visit: Payer: Self-pay

## 2015-04-08 MED ORDER — FIRST-DUKES MOUTHWASH MT SUSP: OROMUCOSAL | Status: DC

## 2015-04-13 ENCOUNTER — Encounter: Payer: Medicare Other | Admitting: Physical Therapy

## 2015-05-17 ENCOUNTER — Ambulatory Visit (INDEPENDENT_AMBULATORY_CARE_PROVIDER_SITE_OTHER): Payer: Medicare Other | Admitting: Internal Medicine

## 2015-05-17 ENCOUNTER — Encounter: Payer: Self-pay | Admitting: Internal Medicine

## 2015-05-17 VITALS — BP 158/100 | HR 88 | Temp 97.9°F | Resp 18 | Ht 63.0 in | Wt 118.5 lb

## 2015-05-17 DIAGNOSIS — F439 Reaction to severe stress, unspecified: Secondary | ICD-10-CM

## 2015-05-17 DIAGNOSIS — Z1239 Encounter for other screening for malignant neoplasm of breast: Secondary | ICD-10-CM | POA: Diagnosis not present

## 2015-05-17 DIAGNOSIS — Z733 Stress, not elsewhere classified: Secondary | ICD-10-CM | POA: Diagnosis not present

## 2015-05-17 DIAGNOSIS — Z23 Encounter for immunization: Secondary | ICD-10-CM

## 2015-05-17 DIAGNOSIS — M810 Age-related osteoporosis without current pathological fracture: Secondary | ICD-10-CM | POA: Diagnosis not present

## 2015-05-17 DIAGNOSIS — I10 Essential (primary) hypertension: Secondary | ICD-10-CM | POA: Diagnosis not present

## 2015-05-17 DIAGNOSIS — Z0000 Encounter for general adult medical examination without abnormal findings: Secondary | ICD-10-CM | POA: Diagnosis not present

## 2015-05-17 DIAGNOSIS — M542 Cervicalgia: Secondary | ICD-10-CM

## 2015-05-17 LAB — COMPREHENSIVE METABOLIC PANEL
ALT: 15 U/L (ref 0–35)
AST: 19 U/L (ref 0–37)
Albumin: 3.9 g/dL (ref 3.5–5.2)
Alkaline Phosphatase: 79 U/L (ref 39–117)
BUN: 15 mg/dL (ref 6–23)
CALCIUM: 9.3 mg/dL (ref 8.4–10.5)
CHLORIDE: 103 meq/L (ref 96–112)
CO2: 30 meq/L (ref 19–32)
Creatinine, Ser: 0.74 mg/dL (ref 0.40–1.20)
GFR: 80 mL/min (ref >60.00)
Glucose, Bld: 101 mg/dL — ABNORMAL HIGH (ref 70–99)
Potassium: 3.9 mEq/L (ref 3.5–5.1)
Sodium: 139 mEq/L (ref 135–145)
Total Bilirubin: 0.5 mg/dL (ref 0.2–1.2)
Total Protein: 6.5 g/dL (ref 6.0–8.3)

## 2015-05-17 LAB — LIPID PANEL
CHOLESTEROL: 172 mg/dL (ref 0–200)
HDL: 52.6 mg/dL (ref >39.00)
LDL CALC: 103 mg/dL — AB (ref 0–99)
NonHDL: 119.38
TRIGLYCERIDES: 83 mg/dL (ref 0.0–149.0)
Total CHOL/HDL Ratio: 3
VLDL: 16.6 mg/dL (ref 0.0–40.0)

## 2015-05-17 LAB — CBC WITH DIFFERENTIAL/PLATELET
BASOS PCT: 0.4 % (ref 0.0–3.0)
Basophils Absolute: 0 10*3/uL (ref 0.0–0.1)
EOS PCT: 1.6 % (ref 0.0–5.0)
Eosinophils Absolute: 0.1 10*3/uL (ref 0.0–0.7)
HEMATOCRIT: 42.8 % (ref 36.0–46.0)
Hemoglobin: 14.4 g/dL (ref 12.0–15.0)
LYMPHS PCT: 28.6 % (ref 12.0–46.0)
Lymphs Abs: 1.8 10*3/uL (ref 0.7–4.0)
MCHC: 33.7 g/dL (ref 30.0–36.0)
MCV: 94.4 fl (ref 78.0–100.0)
MONOS PCT: 7.1 % (ref 3.0–12.0)
Monocytes Absolute: 0.4 10*3/uL (ref 0.1–1.0)
NEUTROS ABS: 3.9 10*3/uL (ref 1.4–7.7)
Neutrophils Relative %: 62.3 % (ref 43.0–77.0)
PLATELETS: 233 10*3/uL (ref 150.0–400.0)
RBC: 4.53 Mil/uL (ref 3.87–5.11)
RDW: 13.5 % (ref 11.5–15.5)
WBC: 6.2 10*3/uL (ref 4.0–10.5)

## 2015-05-17 LAB — VITAMIN D 25 HYDROXY (VIT D DEFICIENCY, FRACTURES): VITD: 34.34 ng/mL (ref 30.00–100.00)

## 2015-05-17 LAB — TSH: TSH: 3.57 u[IU]/mL (ref 0.35–4.50)

## 2015-05-17 NOTE — Progress Notes (Signed)
Patient ID: Claire Clayton, female   DOB: Feb 12, 1932, 81 y.o.   MRN: [id number]   Subjective:    Patient ID: Claire Clayton, female    DOB: 07/28/1931, 81 y.o.   MRN: [id number]  HPI  Patient with past history of hypertension and allergies.  She comes in today to follow up on these issues as well as for a complete physical exam.  Stays active.  No cardiac symptoms with increased activity or exertion.  No sob.  No acid reflux.  No abdominal pain or cramping.  Bowels stable.  Still with neck pain.  Went to therapy.  Still doing some exercises.  Pain mostly - left side.  Discussed the need for bone density.  Increased stress with her granddaughter's situation.   Feels she is handling things relatively well.     Past Medical History  Diagnosis Date  . Hypertension   . Osteoporosis   . Cancer (HCC)     squamous cell skin  . Seasonal allergies   . Glaucoma   . Diverticulosis    Past Surgical History  Procedure Laterality Date  . Breast biopsy  1970    benign  . Tonsillectomy  1948  . Cataract extraction, bilateral    . Skin cancer excision      carcinoma (3 surgeries)   Family History  Problem Relation Age of Onset  . Colon cancer Brother   . Colon polyps Sister    Social History   Social History  . Marital Status: Widowed    Spouse Name: N/A  . Number of Children: 3  . Years of Education: N/A   Social History Main Topics  . Smoking status: Never Smoker   . Smokeless tobacco: Never Used  . Alcohol Use: 0.0 oz/week    0 Standard drinks or equivalent per week     Comment: rare - glass of wine  . Drug Use: No  . Sexual Activity: No   Other Topics Concern  . None   Social History Narrative    Outpatient Encounter Prescriptions as of 05/17/2015  Medication Sig  . aspirin EC 81 MG tablet Take 81 mg by mouth daily.  Marland Kitchen atenolol (TENORMIN) 25 MG tablet Take 1 tablet (25 mg total) by mouth daily.  . busPIRone (BUSPAR) 5 MG tablet Take 1 tablet (5 mg total) by mouth  daily as needed.  . Calcium Carbonate-Vitamin D 600-400 MG-UNIT per tablet Take 1 tablet by mouth daily.   . Cetirizine HCl (ZYRTEC ALLERGY PO) Take by mouth.  . Diphenhyd-Hydrocort-Nystatin (FIRST-DUKES MOUTHWASH) SUSP 10cc's swish and spit tid  . Fish Oil-Cholecalciferol (FISH OIL + D3 PO) Take by mouth daily.  Marland Kitchen lisinopril (PRINIVIL,ZESTRIL) 20 MG tablet Take 0.5 tablets (10 mg total) by mouth daily.  . Multiple Vitamins-Minerals (ICAPS PO) Take by mouth.  . timolol (BETIMOL) 0.5 % ophthalmic solution Place 1 drop into both eyes 2 (two) times daily.   No facility-administered encounter medications on file as of 05/17/2015.    Review of Systems  Constitutional: Negative for appetite change and unexpected weight change.  HENT: Negative for congestion and sinus pressure.   Eyes: Negative for pain and visual disturbance.  Respiratory: Negative for cough, chest tightness and shortness of breath.   Cardiovascular: Negative for chest pain, palpitations and leg swelling.  Gastrointestinal: Negative for nausea, vomiting, abdominal pain and diarrhea.  Genitourinary: Negative for dysuria and difficulty urinating.  Musculoskeletal: Positive for neck pain. Negative for back pain and joint swelling.  Skin: Negative  for color change and rash.  Neurological: Negative for dizziness, light-headedness and headaches.  Hematological: Negative for adenopathy. Does not bruise/bleed easily.  Psychiatric/Behavioral: Negative for dysphoric mood and agitation.       Objective:     Blood pressure rechecked prior to leaving:  144/90  Physical Exam  Constitutional: She is oriented to person, place, and time. She appears well-developed and well-nourished. No distress.  HENT:  Nose: Nose normal.  Mouth/Throat: Oropharynx is clear and moist.  Eyes: Conjunctivae are normal. Right eye exhibits no discharge. Left eye exhibits no discharge. No scleral icterus.  Neck: Neck supple. No thyromegaly present.    Cardiovascular: Normal rate and regular rhythm.   Pulmonary/Chest: Effort normal and breath sounds normal. No accessory muscle usage. No tachypnea. No respiratory distress. She has no decreased breath sounds. She has no wheezes. She has no rhonchi. Right breast exhibits no inverted nipple, no mass, no nipple discharge and no tenderness (no axillary adenopathy). Left breast exhibits no inverted nipple, no mass, no nipple discharge and no tenderness (no axilarry adenopathy).  Abdominal: Soft. Bowel sounds are normal. There is no tenderness.  Musculoskeletal: She exhibits no edema or tenderness.  Lymphadenopathy:    She has no cervical adenopathy.  Neurological: She is alert and oriented to person, place, and time.  Skin: Skin is warm. No rash noted. No erythema.  Psychiatric: She has a normal mood and affect. Her behavior is normal.    BP 158/100 mmHg  Pulse 88  Temp(Src) 97.9 F (36.6 C) (Oral)  Resp 18  Ht 5\' 3"  (1.6 m)  Wt 118 lb 8 oz (53.751 kg)  BMI 21.00 kg/m2  SpO2 97% Wt Readings from Last 3 Encounters:  05/17/15 118 lb 8 oz (53.751 kg)  01/11/15 118 lb (53.524 kg)  08/13/14 118 lb 8 oz (53.751 kg)     Lab Results  Component Value Date   WBC 6.2 05/17/2015   HGB 14.4 05/17/2015   HCT 42.8 05/17/2015   PLT 233.0 05/17/2015   GLUCOSE 101* 05/17/2015   CHOL 172 05/17/2015   TRIG 83.0 05/17/2015   HDL 52.60 05/17/2015   LDLCALC 103* 05/17/2015   ALT 15 05/17/2015   AST 19 05/17/2015   NA 139 05/17/2015   K 3.9 05/17/2015   CL 103 05/17/2015   CREATININE 0.74 05/17/2015   BUN 15 05/17/2015   CO2 30 05/17/2015   TSH 3.57 05/17/2015    Dg Cervical Spine Complete  02/23/2015  CLINICAL DATA:  One month of left-sided neck pain without known injury, painful to turn the head. EXAM: CERVICAL SPINE  4+ VIEWS COMPARISON:  None in PACs FINDINGS: The cervical vertebral bodies are preserved in height. There is mild to moderate disc space narrowing at C3-4, C4-5, C5-6, and C6-7.  There is no spondylolisthesis. There is mild facet joint hypertrophy throughout the cervical spine. The neural foramina reveal no high-grade bony encroachment. The prevertebral soft tissue spaces are normal. The odontoid is intact where visualized. There is degenerative change of the articulation of the left lateral mass of C1 with that of C2 IMPRESSION: There is moderate multilevel degenerative disc, facet joint, and uncovertebral joint change. There is no acute bony abnormality. Electronically Signed   By: David  Martinique M.D.   On: 02/23/2015 10:13       Assessment & Plan:   Problem List Items Addressed This Visit    Essential hypertension, benign    Blood pressure on recheck as outlined.  Better on checks outside of  the office.  Continue same medication regimen.  Follow pressures.  Follow metabolic panel.        Relevant Orders   CBC with Differential/Platelet (Completed)   Comprehensive metabolic panel (Completed)   Lipid panel (Completed)   Health care maintenance    Physical today 05/17/15.  Colonoscopy 06/09/09 - diverticulosis and internal hemorrhoids.  Mammogram 05/29/14 - birads I.  Schedule f/u mammogram.  Schedule bone density.        Neck pain    Had c-spine xray as outlined.  Still with some pain.  Physical therapy.  Appears to be better.  Desires no further intervention at this time.  Follow.       Osteoporosis    Off fosamax.  Continue calciium and vitamin D.  Schedule bone density.        Relevant Orders   TSH (Completed)   VITAMIN D 25 Hydroxy (Vit-D Deficiency, Fractures) (Completed)   DG Bone Density   Stress - Primary    Increased stress as outlined.  Feels she is coping well.  Follow.  Desires no further intervention.  Follow.         Other Visit Diagnoses    Screening breast examination        Relevant Orders    MM DIGITAL SCREENING BILATERAL        Einar Pheasant, MD

## 2015-05-17 NOTE — Progress Notes (Signed)
Pre-visit discussion using our clinic review tool. No additional management support is needed unless otherwise documented below in the visit note.  

## 2015-05-17 NOTE — Patient Instructions (Signed)

## 2015-05-18 ENCOUNTER — Encounter: Payer: Self-pay | Admitting: *Deleted

## 2015-05-23 ENCOUNTER — Encounter: Payer: Self-pay | Admitting: Internal Medicine

## 2015-05-23 DIAGNOSIS — M542 Cervicalgia: Secondary | ICD-10-CM | POA: Insufficient documentation

## 2015-05-23 NOTE — Assessment & Plan Note (Signed)
Increased stress as outlined.  Feels she is coping well.  Follow.  Desires no further intervention.  Follow.

## 2015-05-23 NOTE — Assessment & Plan Note (Signed)
Had c-spine xray as outlined.  Still with some pain.  Physical therapy.  Appears to be better.  Desires no further intervention at this time.  Follow.

## 2015-05-23 NOTE — Assessment & Plan Note (Signed)
Blood pressure on recheck as outlined.  Better on checks outside of the office.  Continue same medication regimen.  Follow pressures.  Follow metabolic panel.

## 2015-05-23 NOTE — Assessment & Plan Note (Signed)
Physical today 05/17/15.  Colonoscopy 06/09/09 - diverticulosis and internal hemorrhoids.  Mammogram 05/29/14 - birads I.  Schedule f/u mammogram.  Schedule bone density.

## 2015-05-23 NOTE — Assessment & Plan Note (Signed)
Off fosamax.  Continue calciium and vitamin D.  Schedule bone density.

## 2015-05-31 DIAGNOSIS — H401132 Primary open-angle glaucoma, bilateral, moderate stage: Secondary | ICD-10-CM | POA: Diagnosis not present

## 2015-06-08 ENCOUNTER — Other Ambulatory Visit: Payer: Self-pay | Admitting: Internal Medicine

## 2015-06-23 ENCOUNTER — Ambulatory Visit
Admission: RE | Admit: 2015-06-23 | Discharge: 2015-06-23 | Disposition: A | Discharge status: Home / Self Care | Payer: PPO | Source: Ambulatory Visit | Attending: Internal Medicine | Admitting: Internal Medicine

## 2015-06-23 DIAGNOSIS — Z13820 Encounter for screening for osteoporosis: Secondary | ICD-10-CM | POA: Diagnosis not present

## 2015-06-23 DIAGNOSIS — Z1239 Encounter for other screening for malignant neoplasm of breast: Secondary | ICD-10-CM

## 2015-06-23 DIAGNOSIS — M810 Age-related osteoporosis without current pathological fracture: Secondary | ICD-10-CM | POA: Diagnosis not present

## 2015-06-23 DIAGNOSIS — M8580 Other specified disorders of bone density and structure, unspecified site: Secondary | ICD-10-CM | POA: Diagnosis not present

## 2015-06-23 DIAGNOSIS — Z1231 Encounter for screening mammogram for malignant neoplasm of breast: Secondary | ICD-10-CM | POA: Insufficient documentation

## 2015-06-24 ENCOUNTER — Other Ambulatory Visit: Payer: Self-pay | Admitting: Internal Medicine

## 2015-06-24 DIAGNOSIS — R928 Other abnormal and inconclusive findings on diagnostic imaging of breast: Secondary | ICD-10-CM

## 2015-06-24 NOTE — Progress Notes (Signed)
Order placed for f/u left breast diagnostic mammogram with ultrasound.

## 2015-07-09 ENCOUNTER — Other Ambulatory Visit: Payer: Self-pay | Admitting: Internal Medicine

## 2015-07-09 ENCOUNTER — Ambulatory Visit
Admission: RE | Admit: 2015-07-09 | Discharge: 2015-07-09 | Disposition: A | Discharge status: Home / Self Care | Payer: PPO | Source: Ambulatory Visit | Attending: Internal Medicine | Admitting: Internal Medicine

## 2015-07-09 DIAGNOSIS — R918 Other nonspecific abnormal finding of lung field: Secondary | ICD-10-CM | POA: Diagnosis not present

## 2015-07-09 DIAGNOSIS — R928 Other abnormal and inconclusive findings on diagnostic imaging of breast: Secondary | ICD-10-CM | POA: Insufficient documentation

## 2015-07-20 DIAGNOSIS — L820 Inflamed seborrheic keratosis: Secondary | ICD-10-CM | POA: Diagnosis not present

## 2015-07-20 DIAGNOSIS — D485 Neoplasm of uncertain behavior of skin: Secondary | ICD-10-CM | POA: Diagnosis not present

## 2015-07-20 DIAGNOSIS — L309 Dermatitis, unspecified: Secondary | ICD-10-CM | POA: Diagnosis not present

## 2015-07-20 DIAGNOSIS — L570 Actinic keratosis: Secondary | ICD-10-CM | POA: Diagnosis not present

## 2015-07-20 DIAGNOSIS — B009 Herpesviral infection, unspecified: Secondary | ICD-10-CM | POA: Diagnosis not present

## 2015-08-03 DIAGNOSIS — D3132 Benign neoplasm of left choroid: Secondary | ICD-10-CM | POA: Diagnosis not present

## 2015-08-03 DIAGNOSIS — H353132 Nonexudative age-related macular degeneration, bilateral, intermediate dry stage: Secondary | ICD-10-CM | POA: Diagnosis not present

## 2015-08-03 DIAGNOSIS — H43813 Vitreous degeneration, bilateral: Secondary | ICD-10-CM | POA: Diagnosis not present

## 2015-08-17 DIAGNOSIS — C44729 Squamous cell carcinoma of skin of left lower limb, including hip: Secondary | ICD-10-CM | POA: Diagnosis not present

## 2015-08-17 DIAGNOSIS — L905 Scar conditions and fibrosis of skin: Secondary | ICD-10-CM | POA: Diagnosis not present

## 2015-09-15 ENCOUNTER — Ambulatory Visit: Payer: Medicare Other | Admitting: Internal Medicine

## 2015-09-22 ENCOUNTER — Ambulatory Visit (INDEPENDENT_AMBULATORY_CARE_PROVIDER_SITE_OTHER): Payer: PPO | Admitting: Internal Medicine

## 2015-09-22 ENCOUNTER — Encounter: Payer: Self-pay | Admitting: Internal Medicine

## 2015-09-22 VITALS — BP 138/86 | HR 76 | Temp 97.8°F | Resp 18 | Ht 63.0 in | Wt 118.1 lb

## 2015-09-22 DIAGNOSIS — Z658 Other specified problems related to psychosocial circumstances: Secondary | ICD-10-CM

## 2015-09-22 DIAGNOSIS — Z800 Family history of malignant neoplasm of digestive organs: Secondary | ICD-10-CM | POA: Diagnosis not present

## 2015-09-22 DIAGNOSIS — I10 Essential (primary) hypertension: Secondary | ICD-10-CM | POA: Diagnosis not present

## 2015-09-22 DIAGNOSIS — R928 Other abnormal and inconclusive findings on diagnostic imaging of breast: Secondary | ICD-10-CM

## 2015-09-22 DIAGNOSIS — M810 Age-related osteoporosis without current pathological fracture: Secondary | ICD-10-CM | POA: Diagnosis not present

## 2015-09-22 DIAGNOSIS — F439 Reaction to severe stress, unspecified: Secondary | ICD-10-CM

## 2015-09-22 LAB — BASIC METABOLIC PANEL
BUN: 19 mg/dL (ref 6–23)
CHLORIDE: 102 meq/L (ref 96–112)
CO2: 30 mEq/L (ref 19–32)
Calcium: 9.6 mg/dL (ref 8.4–10.5)
Creatinine, Ser: 0.76 mg/dL (ref 0.40–1.20)
GFR: 77.5 mL/min (ref >60.00)
Glucose, Bld: 101 mg/dL — ABNORMAL HIGH (ref 70–99)
POTASSIUM: 4.4 meq/L (ref 3.5–5.1)
SODIUM: 137 meq/L (ref 135–145)

## 2015-09-22 NOTE — Progress Notes (Signed)
Patient ID: Claire Clayton, female   DOB: 1932/05/22, 82 y.o.   MRN: [id number]   Subjective:    Patient ID: Claire Clayton, female    DOB: Dec 08, 1931, 82 y.o.   MRN: [id number]  HPI  Patient here for a scheduled follow up.  States she is doing well.  Stays active.  Increased stress with her granddaughter's situation.  Her granddaughter is off drugs now.  Still in [place].  Feels she is handling things relatively well.  Her grandson is staying with her now.  This is working out well.  No chest pain.  No chest tightness.  No sob.  No acid reflux. No abdominal pain or cramping.  Bowels stable. Blood pressure on outside checks - 130s/70-80s.     Past Medical History  Diagnosis Date  . Hypertension   . Osteoporosis   . Seasonal allergies   . Glaucoma   . Diverticulosis   . Cancer ([place])     squamous cell skin   Past Surgical History  Procedure Laterality Date  . Tonsillectomy  1948  . Cataract extraction, bilateral    . Skin cancer excision      carcinoma (3 surgeries)  . Breast biopsy Left 1970    benign   Family History  Problem Relation Age of Onset  . Colon cancer Brother   . Colon polyps Sister   . Breast cancer Neg Hx    Social History   Social History  . Marital Status: Widowed    Spouse Name: N/A  . Number of Children: 3  . Years of Education: N/A   Social History Main Topics  . Smoking status: Never Smoker   . Smokeless tobacco: Never Used  . Alcohol Use: 0.0 oz/week    0 Standard drinks or equivalent per week     Comment: rare - glass of wine  . Drug Use: No  . Sexual Activity: No   Other Topics Concern  . None   Social History Narrative    Outpatient Encounter Prescriptions as of 09/22/2015  Medication Sig  . aspirin EC 81 MG tablet Take 81 mg by mouth daily.  Marland Kitchen atenolol (TENORMIN) 25 MG tablet Take 1 tablet (25 mg total) by mouth daily.  . busPIRone (BUSPAR) 5 MG tablet Take 1 tablet (5 mg total) by mouth daily as needed.  . Calcium  Carbonate-Vitamin D 600-400 MG-UNIT per tablet Take 1 tablet by mouth daily.   . Cetirizine HCl (ZYRTEC ALLERGY PO) Take by mouth.  . Diphenhyd-Hydrocort-Nystatin (FIRST-DUKES MOUTHWASH) SUSP 10cc's swish and spit tid  . Fish Oil-Cholecalciferol (FISH OIL + D3 PO) Take by mouth daily.  Marland Kitchen lisinopril (PRINIVIL,ZESTRIL) 20 MG tablet Take 0.5 tablets (10 mg total) by mouth daily.  . Multiple Vitamins-Minerals (ICAPS PO) Take by mouth.  . timolol (BETIMOL) 0.5 % ophthalmic solution Place 1 drop into both eyes 2 (two) times daily.   No facility-administered encounter medications on file as of 09/22/2015.    Review of Systems  Constitutional: Negative for appetite change and unexpected weight change.  HENT: Negative for congestion and sinus pressure.   Respiratory: Negative for cough, chest tightness and shortness of breath.   Cardiovascular: Negative for chest pain, palpitations and leg swelling.  Gastrointestinal: Negative for nausea, vomiting, abdominal pain and diarrhea.  Genitourinary: Negative for dysuria and difficulty urinating.  Musculoskeletal: Negative for back pain and joint swelling.  Skin: Negative for color change and rash.  Neurological: Negative for dizziness, light-headedness and headaches.  Psychiatric/Behavioral: Negative for  dysphoric mood and agitation.       Objective:     Blood pressure rechecked by me:  138/84-86  Physical Exam  Constitutional: She appears well-developed and well-nourished. No distress.  HENT:  Nose: Nose normal.  Mouth/Throat: Oropharynx is clear and moist.  Neck: Neck supple. No thyromegaly present.  Cardiovascular: Normal rate and regular rhythm.   Pulmonary/Chest: Breath sounds normal. No respiratory distress. She has no wheezes.  Abdominal: Soft. Bowel sounds are normal. There is no tenderness.  Musculoskeletal: She exhibits no edema or tenderness.  Lymphadenopathy:    She has no cervical adenopathy.  Skin: No rash noted. No erythema.    S/p removal squamous cell - left calf.   Psychiatric: She has a normal mood and affect. Her behavior is normal.    BP 138/86 mmHg  Pulse 76  Temp(Src) 97.8 F (36.6 C) (Oral)  Resp 18  Ht 5\' 3"  (1.6 m)  Wt 118 lb 2 oz (53.581 kg)  BMI 20.93 kg/m2  SpO2 97% Wt Readings from Last 3 Encounters:  09/22/15 118 lb 2 oz (53.581 kg)  05/17/15 118 lb 8 oz (53.751 kg)  01/11/15 118 lb (53.524 kg)     Lab Results  Component Value Date   WBC 6.2 05/17/2015   HGB 14.4 05/17/2015   HCT 42.8 05/17/2015   PLT 233.0 05/17/2015   GLUCOSE 101* 09/22/2015   CHOL 172 05/17/2015   TRIG 83.0 05/17/2015   HDL 52.60 05/17/2015   LDLCALC 103* 05/17/2015   ALT 15 05/17/2015   AST 19 05/17/2015   NA 137 09/22/2015   K 4.4 09/22/2015   CL 102 09/22/2015   CREATININE 0.76 09/22/2015   BUN 19 09/22/2015   CO2 30 09/22/2015   TSH 3.57 05/17/2015    Mm Diag Breast Tomo Uni Left  07/09/2015  CLINICAL DATA:  Screening recall for a possible asymmetry in the left breast. EXAM: DIGITAL DIAGNOSTIC LEFT MAMMOGRAM WITH 3D TOMOSYNTHESIS AND CAD COMPARISON:  Previous exam(s). ACR Breast Density Category c: The breast tissue is heterogeneously dense, which may obscure small masses. FINDINGS: The possible asymmetry appears as closely approximated fibroglandular tissue on the diagnostic 3D tomosynthesis images. There are no discrete masses or areas of significant asymmetry. There is no architectural distortion and there are no suspicious calcifications. No mammographic change from prior exams. Mammographic images were processed with CAD. IMPRESSION: No evidence of malignancy.  With RECOMMENDATION: Screening mammogram in one year.(Code:SM-B-01Y) I have discussed the findings and recommendations with the patient. Results were also provided in writing at the conclusion of the visit. If applicable, a reminder letter will be sent to the patient regarding the next appointment. BI-RADS CATEGORY  1: Negative. Electronically  Signed   By: Lajean Manes M.D.   On: 07/09/2015 11:32       Assessment & Plan:   Problem List Items Addressed This Visit    Abnormal mammogram    Mammogram 06/23/15 - Birads 0.  F/u mammogram 07/09/15 - Birads I      Essential hypertension, benign - Primary    Blood pressure on recheck improved.  Her checks are under good control.  See attached list.  Continue same medication regimen.  Follow metabolic panel.        Relevant Orders   Basic metabolic panel (Completed)   Family history of colon cancer    Colonoscopy 05/2009.        Osteoporosis    Discussed bone density results.  She had previously been on fosamax.  Discussed treatment options.  She declines.  Desires not to start any medication for her bones.  Continue weight bearing exercise, calcium and vitamin d.        Stress    Increased stress.  Discussed with her today.  Feels she is coping relatively well.  Follow.           Einar Pheasant, MD

## 2015-09-22 NOTE — Progress Notes (Signed)
Pre-visit discussion using our clinic review tool. No additional management support is needed unless otherwise documented below in the visit note.  

## 2015-09-23 ENCOUNTER — Encounter: Payer: Self-pay | Admitting: *Deleted

## 2015-09-27 ENCOUNTER — Encounter: Payer: Self-pay | Admitting: Internal Medicine

## 2015-09-27 DIAGNOSIS — R928 Other abnormal and inconclusive findings on diagnostic imaging of breast: Secondary | ICD-10-CM | POA: Insufficient documentation

## 2015-09-27 NOTE — Assessment & Plan Note (Signed)
Discussed bone density results.  She had previously been on fosamax.  Discussed treatment options.  She declines.  Desires not to start any medication for her bones.  Continue weight bearing exercise, calcium and vitamin d.

## 2015-09-27 NOTE — Assessment & Plan Note (Signed)
Mammogram 06/23/15 - Birads 0.  F/u mammogram 07/09/15 - Birads I

## 2015-09-27 NOTE — Assessment & Plan Note (Signed)
Blood pressure on recheck improved.  Her checks are under good control.  See attached list.  Continue same medication regimen.  Follow metabolic panel.

## 2015-09-27 NOTE — Assessment & Plan Note (Signed)
Increased stress.  Discussed with her today.  Feels she is coping relatively well.  Follow.

## 2015-09-27 NOTE — Assessment & Plan Note (Signed)
Colonoscopy 05/2009.

## 2015-10-06 ENCOUNTER — Other Ambulatory Visit: Payer: Self-pay | Admitting: Internal Medicine

## 2015-11-08 DIAGNOSIS — C44722 Squamous cell carcinoma of skin of right lower limb, including hip: Secondary | ICD-10-CM | POA: Diagnosis not present

## 2015-11-08 DIAGNOSIS — T148 Other injury of unspecified body region: Secondary | ICD-10-CM | POA: Diagnosis not present

## 2015-11-08 DIAGNOSIS — C44729 Squamous cell carcinoma of skin of left lower limb, including hip: Secondary | ICD-10-CM | POA: Diagnosis not present

## 2015-11-08 DIAGNOSIS — D485 Neoplasm of uncertain behavior of skin: Secondary | ICD-10-CM | POA: Diagnosis not present

## 2015-12-06 DIAGNOSIS — H40153 Residual stage of open-angle glaucoma, bilateral: Secondary | ICD-10-CM | POA: Diagnosis not present

## 2016-01-10 DIAGNOSIS — C44729 Squamous cell carcinoma of skin of left lower limb, including hip: Secondary | ICD-10-CM | POA: Diagnosis not present

## 2016-01-10 DIAGNOSIS — C44722 Squamous cell carcinoma of skin of right lower limb, including hip: Secondary | ICD-10-CM | POA: Diagnosis not present

## 2016-01-26 ENCOUNTER — Encounter: Payer: Self-pay | Admitting: Internal Medicine

## 2016-01-26 ENCOUNTER — Ambulatory Visit (INDEPENDENT_AMBULATORY_CARE_PROVIDER_SITE_OTHER): Payer: PPO | Admitting: Internal Medicine

## 2016-01-26 ENCOUNTER — Other Ambulatory Visit: Payer: Self-pay | Admitting: Internal Medicine

## 2016-01-26 ENCOUNTER — Encounter (INDEPENDENT_AMBULATORY_CARE_PROVIDER_SITE_OTHER): Payer: Self-pay

## 2016-01-26 VITALS — BP 140/90 | HR 80 | Temp 97.9°F | Resp 17 | Ht 63.0 in | Wt 115.2 lb

## 2016-01-26 DIAGNOSIS — F439 Reaction to severe stress, unspecified: Secondary | ICD-10-CM

## 2016-01-26 DIAGNOSIS — Z658 Other specified problems related to psychosocial circumstances: Secondary | ICD-10-CM

## 2016-01-26 DIAGNOSIS — I10 Essential (primary) hypertension: Secondary | ICD-10-CM

## 2016-01-26 DIAGNOSIS — E875 Hyperkalemia: Secondary | ICD-10-CM

## 2016-01-26 LAB — BASIC METABOLIC PANEL
BUN: 12 mg/dL (ref 6–23)
CALCIUM: 9.8 mg/dL (ref 8.4–10.5)
CHLORIDE: 103 meq/L (ref 96–112)
CO2: 32 meq/L (ref 19–32)
Creatinine, Ser: 0.78 mg/dL (ref 0.40–1.20)
GFR: 75.15 mL/min (ref >60.00)
GLUCOSE: 113 mg/dL — AB (ref 70–99)
POTASSIUM: 5.4 meq/L — AB (ref 3.5–5.1)
SODIUM: 138 meq/L (ref 135–145)

## 2016-01-26 NOTE — Progress Notes (Signed)
Pre-visit discussion using our clinic review tool. No additional management support is needed unless otherwise documented below in the visit note.  

## 2016-01-26 NOTE — Progress Notes (Signed)
Patient ID: Claire Clayton, female   DOB: 1931-10-17, 82 y.o.   MRN: [id number]   Subjective:    Patient ID: Claire Clayton, female    DOB: Feb 17, 1932, 82 y.o.   MRN: [id number]  HPI  Patient here for a scheduled follow up.  Overall she feels she is doing relatively well.  Increased stress with family issues.  We discussed this today.  She does not feel needs any further intervention.  No chest pain.  No sob.  No acid reflux.  No abdominal pain or cramping.  Bowels stable.  Seeing dermatology.  Has had multiple lesions removed.  Blood pressures on outside checks averaging 115-130s/70-80.    Past Medical History:  Diagnosis Date  . Cancer (HCC)    squamous cell skin  . Diverticulosis   . Glaucoma   . Hypertension   . Osteoporosis   . Seasonal allergies    Past Surgical History:  Procedure Laterality Date  . BREAST BIOPSY Left 1970   benign  . CATARACT EXTRACTION, BILATERAL    . SKIN CANCER EXCISION     carcinoma (3 surgeries)  . TONSILLECTOMY  1948   Family History  Problem Relation Age of Onset  . Colon cancer Brother   . Colon polyps Sister   . Breast cancer Neg Hx    Social History   Social History  . Marital status: Widowed    Spouse name: N/A  . Number of children: 3  . Years of education: N/A   Social History Main Topics  . Smoking status: Never Smoker  . Smokeless tobacco: Never Used  . Alcohol use 0.0 oz/week     Comment: rare - glass of wine  . Drug use: No  . Sexual activity: No   Other Topics Concern  . None   Social History Narrative  . None    Outpatient Encounter Prescriptions as of 01/26/2016  Medication Sig  . aspirin EC 81 MG tablet Take 81 mg by mouth daily.  Marland Kitchen atenolol (TENORMIN) 25 MG tablet Take 1 tablet (25 mg total) by mouth daily.  . busPIRone (BUSPAR) 5 MG tablet Take 1 tablet (5 mg total) by mouth daily as needed.  . Calcium Carbonate-Vitamin D 600-400 MG-UNIT per tablet Take 1 tablet by mouth daily.   . Cetirizine HCl  (ZYRTEC ALLERGY PO) Take by mouth.  . Diphenhyd-Hydrocort-Nystatin (FIRST-DUKES MOUTHWASH) SUSP 10cc's swish and spit tid  . Fish Oil-Cholecalciferol (FISH OIL + D3 PO) Take by mouth daily.  Marland Kitchen lisinopril (PRINIVIL,ZESTRIL) 20 MG tablet Take 0.5 tablets (10 mg total) by mouth daily.  . Multiple Vitamins-Minerals (ICAPS PO) Take by mouth.  . timolol (BETIMOL) 0.5 % ophthalmic solution Place 1 drop into both eyes 2 (two) times daily.   No facility-administered encounter medications on file as of 01/26/2016.     Review of Systems  Constitutional: Negative for appetite change and unexpected weight change.  HENT: Negative for congestion and sinus pressure.   Respiratory: Negative for cough, chest tightness and shortness of breath.   Cardiovascular: Negative for chest pain, palpitations and leg swelling.  Gastrointestinal: Negative for abdominal pain, diarrhea, nausea and vomiting.  Genitourinary: Negative for difficulty urinating and dysuria.  Musculoskeletal: Negative for back pain and joint swelling.  Skin: Negative for color change and rash.  Neurological: Negative for dizziness, light-headedness and headaches.  Psychiatric/Behavioral: Negative for agitation and dysphoric mood.       Increased stress as outlined.         Objective:  Physical Exam  Constitutional: She appears well-developed and well-nourished. No distress.  HENT:  Nose: Nose normal.  Mouth/Throat: Oropharynx is clear and moist.  Neck: Neck supple. No thyromegaly present.  Cardiovascular: Normal rate and regular rhythm.   Pulmonary/Chest: Breath sounds normal. No respiratory distress. She has no wheezes.  Abdominal: Soft. Bowel sounds are normal. There is no tenderness.  Musculoskeletal: She exhibits no edema or tenderness.  Lymphadenopathy:    She has no cervical adenopathy.  Skin: No rash noted. No erythema.  Psychiatric: She has a normal mood and affect. Her behavior is normal.    BP 140/90   Pulse 80    Temp 97.9 F (36.6 C) (Oral)   Resp 17   Ht 5\' 3"  (1.6 m)   Wt 115 lb 4 oz (52.3 kg)   SpO2 98%   BMI 20.42 kg/m  Wt Readings from Last 3 Encounters:  01/26/16 115 lb 4 oz (52.3 kg)  09/22/15 118 lb 2 oz (53.6 kg)  05/17/15 118 lb 8 oz (53.8 kg)     Lab Results  Component Value Date   WBC 6.2 05/17/2015   HGB 14.4 05/17/2015   HCT 42.8 05/17/2015   PLT 233.0 05/17/2015   GLUCOSE 113 (H) 01/26/2016   CHOL 172 05/17/2015   TRIG 83.0 05/17/2015   HDL 52.60 05/17/2015   LDLCALC 103 (H) 05/17/2015   ALT 15 05/17/2015   AST 19 05/17/2015   NA 138 01/26/2016   K 5.4 (H) 01/26/2016   CL 103 01/26/2016   CREATININE 0.78 01/26/2016   BUN 12 01/26/2016   CO2 32 01/26/2016   TSH 3.57 05/17/2015    Mm Diag Breast Tomo Uni Left  Result Date: 07/09/2015 CLINICAL DATA:  Screening recall for a possible asymmetry in the left breast. EXAM: DIGITAL DIAGNOSTIC LEFT MAMMOGRAM WITH 3D TOMOSYNTHESIS AND CAD COMPARISON:  Previous exam(s). ACR Breast Density Category c: The breast tissue is heterogeneously dense, which may obscure small masses. FINDINGS: The possible asymmetry appears as closely approximated fibroglandular tissue on the diagnostic 3D tomosynthesis images. There are no discrete masses or areas of significant asymmetry. There is no architectural distortion and there are no suspicious calcifications. No mammographic change from prior exams. Mammographic images were processed with CAD. IMPRESSION: No evidence of malignancy.  With RECOMMENDATION: Screening mammogram in one year.(Code:SM-B-01Y) I have discussed the findings and recommendations with the patient. Results were also provided in writing at the conclusion of the visit. If applicable, a reminder letter will be sent to the patient regarding the next appointment. BI-RADS CATEGORY  1: Negative. Electronically Signed   By: Lajean Manes M.D.   On: 07/09/2015 11:32       Assessment & Plan:   Problem List Items Addressed This Visit     Essential hypertension, benign - Primary    Blood pressures on her checks are under good control.  Runs higher in the office.  Follow pressures.  Continue same medication regimen.  Follow pressures.  Follow metabolic panel.        Relevant Orders   Basic metabolic panel (Completed)   Stress    Increased stress as outlined.  Discussed with her today.  She does not feel needs any further w/up.  Follow.         Other Visit Diagnoses   None.      Einar Pheasant, MD

## 2016-01-27 ENCOUNTER — Other Ambulatory Visit (INDEPENDENT_AMBULATORY_CARE_PROVIDER_SITE_OTHER): Payer: PPO

## 2016-01-27 ENCOUNTER — Encounter: Payer: Self-pay | Admitting: Internal Medicine

## 2016-01-27 DIAGNOSIS — E875 Hyperkalemia: Secondary | ICD-10-CM

## 2016-01-27 LAB — POTASSIUM: Potassium: 4.3 mEq/L (ref 3.5–5.1)

## 2016-01-27 NOTE — Assessment & Plan Note (Signed)
Increased stress as outlined.  Discussed with her today.  She does not feel needs any further w/up.  Follow.

## 2016-01-27 NOTE — Assessment & Plan Note (Signed)
Blood pressures on her checks are under good control.  Runs higher in the office.  Follow pressures.  Continue same medication regimen.  Follow pressures.  Follow metabolic panel.

## 2016-04-18 DIAGNOSIS — C44729 Squamous cell carcinoma of skin of left lower limb, including hip: Secondary | ICD-10-CM | POA: Diagnosis not present

## 2016-04-18 DIAGNOSIS — D485 Neoplasm of uncertain behavior of skin: Secondary | ICD-10-CM | POA: Diagnosis not present

## 2016-04-25 DIAGNOSIS — C44729 Squamous cell carcinoma of skin of left lower limb, including hip: Secondary | ICD-10-CM | POA: Diagnosis not present

## 2016-04-25 DIAGNOSIS — R208 Other disturbances of skin sensation: Secondary | ICD-10-CM | POA: Diagnosis not present

## 2016-04-27 ENCOUNTER — Other Ambulatory Visit: Payer: Self-pay | Admitting: Internal Medicine

## 2016-04-28 NOTE — Telephone Encounter (Signed)
Patient states she only uses this as needed

## 2016-04-28 NOTE — Telephone Encounter (Signed)
Need to call and see if pt still needs.  If having persistent symptoms, ok to refill x 1, but let me know.  May need to be reevaluated.

## 2016-04-28 NOTE — Telephone Encounter (Signed)
Last filled 04/07/16 273ml 0rf

## 2016-05-18 DIAGNOSIS — H40153 Residual stage of open-angle glaucoma, bilateral: Secondary | ICD-10-CM | POA: Diagnosis not present

## 2016-06-01 ENCOUNTER — Encounter: Payer: Self-pay | Admitting: Internal Medicine

## 2016-06-01 ENCOUNTER — Ambulatory Visit (INDEPENDENT_AMBULATORY_CARE_PROVIDER_SITE_OTHER): Payer: PPO | Admitting: Internal Medicine

## 2016-06-01 VITALS — BP 190/110 | HR 86 | Ht 62.75 in | Wt 116.0 lb

## 2016-06-01 DIAGNOSIS — Z800 Family history of malignant neoplasm of digestive organs: Secondary | ICD-10-CM | POA: Diagnosis not present

## 2016-06-01 DIAGNOSIS — Z0000 Encounter for general adult medical examination without abnormal findings: Secondary | ICD-10-CM | POA: Diagnosis not present

## 2016-06-01 DIAGNOSIS — F439 Reaction to severe stress, unspecified: Secondary | ICD-10-CM

## 2016-06-01 DIAGNOSIS — Z1231 Encounter for screening mammogram for malignant neoplasm of breast: Secondary | ICD-10-CM

## 2016-06-01 DIAGNOSIS — R928 Other abnormal and inconclusive findings on diagnostic imaging of breast: Secondary | ICD-10-CM

## 2016-06-01 DIAGNOSIS — E559 Vitamin D deficiency, unspecified: Secondary | ICD-10-CM | POA: Diagnosis not present

## 2016-06-01 DIAGNOSIS — I10 Essential (primary) hypertension: Secondary | ICD-10-CM | POA: Diagnosis not present

## 2016-06-01 DIAGNOSIS — Z1239 Encounter for other screening for malignant neoplasm of breast: Secondary | ICD-10-CM

## 2016-06-01 LAB — LIPID PANEL
CHOL/HDL RATIO: 3
Cholesterol: 176 mg/dL (ref 0–200)
HDL: 61.8 mg/dL (ref >39.00)
LDL CALC: 90 mg/dL (ref 0–99)
NONHDL: 114.07
TRIGLYCERIDES: 121 mg/dL (ref 0.0–149.0)
VLDL: 24.2 mg/dL (ref 0.0–40.0)

## 2016-06-01 LAB — COMPREHENSIVE METABOLIC PANEL
ALT: 15 U/L (ref 0–35)
AST: 17 U/L (ref 0–37)
Albumin: 4.4 g/dL (ref 3.5–5.2)
Alkaline Phosphatase: 94 U/L (ref 39–117)
BILIRUBIN TOTAL: 0.6 mg/dL (ref 0.2–1.2)
BUN: 15 mg/dL (ref 6–23)
CO2: 30 meq/L (ref 19–32)
Calcium: 9.4 mg/dL (ref 8.4–10.5)
Chloride: 103 mEq/L (ref 96–112)
Creatinine, Ser: 0.8 mg/dL (ref 0.40–1.20)
GFR: 72.92 mL/min (ref >60.00)
GLUCOSE: 117 mg/dL — AB (ref 70–99)
Potassium: 4.7 mEq/L (ref 3.5–5.1)
SODIUM: 139 meq/L (ref 135–145)
TOTAL PROTEIN: 6.7 g/dL (ref 6.0–8.3)

## 2016-06-01 LAB — VITAMIN D 25 HYDROXY (VIT D DEFICIENCY, FRACTURES): VITD: 35.15 ng/mL (ref 30.00–100.00)

## 2016-06-01 LAB — TSH: TSH: 4.01 u[IU]/mL (ref 0.35–4.50)

## 2016-06-01 NOTE — Assessment & Plan Note (Signed)
Colonoscopy 2010.  States had received notice and then spoke with GI.  Holding on any further screening.

## 2016-06-01 NOTE — Assessment & Plan Note (Addendum)
Physical today 06/01/16.  Colonoscopy 06/09/09 - diverticulosis and internal hemorrhoids.  Mammogram 07/09/15 -Birads I.  Schedule f/u mammogram.

## 2016-06-01 NOTE — Assessment & Plan Note (Signed)
Increased stress as outlined.  Takes buspar prn.  Overall she feels she is doing relatively well.  Follow.

## 2016-06-01 NOTE — Progress Notes (Signed)
Patient ID: Claire Clayton, female   DOB: 11-29-1931, 82 y.o.   MRN: [id number]   Subjective:    Patient ID: Claire Clayton, female    DOB: 10-11-1931, 82 y.o.   MRN: [id number]  HPI  Patient with past history of hypertension and osteoporosis. She comes in today to follow up on these issues as well as for a complete physical exam.  Increased stress with her family situation.  Discussed with her today.  Overall she feels she is handling things relatively well.  Stays active.  No chest pain.  No sob.  No acid reflux.  No abdominal pain or cramping.  Bowels stable.  She has been checking her blood pressures.  Outside blood pressure checks are averaging 115-140/70-80s.     Past Medical History:  Diagnosis Date  . Cancer (HCC)    squamous cell skin  . Diverticulosis   . Glaucoma   . Hypertension   . Osteoporosis   . Seasonal allergies    Past Surgical History:  Procedure Laterality Date  . BREAST BIOPSY Left 1970   benign  . CATARACT EXTRACTION, BILATERAL    . SKIN CANCER EXCISION     carcinoma (3 surgeries)  . TONSILLECTOMY  1948   Family History  Problem Relation Age of Onset  . Colon cancer Brother   . Colon polyps Sister   . Breast cancer Neg Hx    Social History   Social History  . Marital status: Widowed    Spouse name: N/A  . Number of children: 3  . Years of education: N/A   Social History Main Topics  . Smoking status: Never Smoker  . Smokeless tobacco: Never Used  . Alcohol use 0.0 oz/week     Comment: rare - glass of wine  . Drug use: No  . Sexual activity: No   Other Topics Concern  . None   Social History Narrative  . None    Outpatient Encounter Prescriptions as of 06/01/2016  Medication Sig  . aspirin EC 81 MG tablet Take 81 mg by mouth daily.  Marland Kitchen atenolol (TENORMIN) 25 MG tablet Take 1 tablet (25 mg total) by mouth daily.  . busPIRone (BUSPAR) 5 MG tablet Take 1 tablet (5 mg total) by mouth daily as needed.  . Calcium Carbonate-Vitamin D  600-400 MG-UNIT per tablet Take 1 tablet by mouth daily.   . Cetirizine HCl (ZYRTEC ALLERGY PO) Take by mouth.  . Fish Oil-Cholecalciferol (FISH OIL + D3 PO) Take by mouth daily.  . hydrocortisone (CORTEF) 10 MG tablet SWISH AND EXPECTORATE TWO TEASPOONFULS THREE TIMES A DAY.  Marland Kitchen lisinopril (PRINIVIL,ZESTRIL) 20 MG tablet Take 0.5 tablets (10 mg total) by mouth daily.  . Multiple Vitamins-Minerals (ICAPS PO) Take by mouth.  . timolol (BETIMOL) 0.5 % ophthalmic solution Place 1 drop into both eyes 2 (two) times daily.   No facility-administered encounter medications on file as of 06/01/2016.     Review of Systems  Constitutional: Negative for appetite change and unexpected weight change.  HENT: Negative for congestion and sinus pressure.   Eyes: Negative for pain and visual disturbance.  Respiratory: Negative for cough, chest tightness and shortness of breath.   Cardiovascular: Negative for chest pain, palpitations and leg swelling.  Gastrointestinal: Negative for abdominal pain, diarrhea, nausea and vomiting.  Genitourinary: Negative for difficulty urinating and dysuria.  Musculoskeletal: Negative for back pain and joint swelling.  Skin: Negative for color change and rash.  Neurological: Negative for dizziness, light-headedness and headaches.  Hematological: Negative for adenopathy. Does not bruise/bleed easily.  Psychiatric/Behavioral: Negative for agitation and dysphoric mood.       Objective:     Blood pressure rechecked by me:  160/94  Physical Exam  Constitutional: She is oriented to person, place, and time. She appears well-developed and well-nourished. No distress.  HENT:  Nose: Nose normal.  Mouth/Throat: Oropharynx is clear and moist.  Eyes: Right eye exhibits no discharge. Left eye exhibits no discharge. No scleral icterus.  Neck: Neck supple. No thyromegaly present.  Cardiovascular: Normal rate and regular rhythm.   Pulmonary/Chest: Breath sounds normal. No accessory  muscle usage. No tachypnea. No respiratory distress. She has no decreased breath sounds. She has no wheezes. She has no rhonchi. Right breast exhibits no inverted nipple, no mass, no nipple discharge and no tenderness (no axillary adenopathy). Left breast exhibits no inverted nipple, no mass, no nipple discharge and no tenderness (no axilarry adenopathy).  Abdominal: Soft. Bowel sounds are normal. There is no tenderness.  Musculoskeletal: She exhibits no edema or tenderness.  Lymphadenopathy:    She has no cervical adenopathy.  Neurological: She is alert and oriented to person, place, and time.  Skin: Skin is warm. No rash noted. No erythema.  Psychiatric: She has a normal mood and affect. Her behavior is normal.    BP (!) 190/110   Pulse 86   Ht 5' 2.75" (1.594 m)   Wt 116 lb (52.6 kg)   SpO2 98%   BMI 20.71 kg/m  Wt Readings from Last 3 Encounters:  06/01/16 116 lb (52.6 kg)  01/26/16 115 lb 4 oz (52.3 kg)  09/22/15 118 lb 2 oz (53.6 kg)     Lab Results  Component Value Date   WBC 7.9 06/01/2016   HGB 15.4 (H) 06/01/2016   HCT 45.2 06/01/2016   PLT 222.0 06/01/2016   GLUCOSE 117 (H) 06/01/2016   CHOL 176 06/01/2016   TRIG 121.0 06/01/2016   HDL 61.80 06/01/2016   LDLCALC 90 06/01/2016   ALT 15 06/01/2016   AST 17 06/01/2016   NA 139 06/01/2016   K 4.7 06/01/2016   CL 103 06/01/2016   CREATININE 0.80 06/01/2016   BUN 15 06/01/2016   CO2 30 06/01/2016   TSH 4.01 06/01/2016    Mm Diag Breast Tomo Uni Left  Result Date: 07/09/2015 CLINICAL DATA:  Screening recall for a possible asymmetry in the left breast. EXAM: DIGITAL DIAGNOSTIC LEFT MAMMOGRAM WITH 3D TOMOSYNTHESIS AND CAD COMPARISON:  Previous exam(s). ACR Breast Density Category c: The breast tissue is heterogeneously dense, which may obscure small masses. FINDINGS: The possible asymmetry appears as closely approximated fibroglandular tissue on the diagnostic 3D tomosynthesis images. There are no discrete masses or  areas of significant asymmetry. There is no architectural distortion and there are no suspicious calcifications. No mammographic change from prior exams. Mammographic images were processed with CAD. IMPRESSION: No evidence of malignancy.  With RECOMMENDATION: Screening mammogram in one year.(Code:SM-B-01Y) I have discussed the findings and recommendations with the patient. Results were also provided in writing at the conclusion of the visit. If applicable, a reminder letter will be sent to the patient regarding the next appointment. BI-RADS CATEGORY  1: Negative. Electronically Signed   By: Lajean Manes M.D.   On: 07/09/2015 11:32       Assessment & Plan:   Problem List Items Addressed This Visit    Abnormal mammogram    Had mammogram 06/23/15 - Birads 0.  F/u mammogram 07/09/15 - Birads  I.  Recommended f/u mammogram in one year.  Scheduled.        Essential hypertension, benign - Primary    Blood pressure elevated on checks here.  Trending down prior to leaving.  See above and her attached list for her blood pressure checks.  Follow pressures.  Same medication regimen.  Follow metabolic panel.        Relevant Orders   CBC with Differential/Platelet (Completed)   Comprehensive metabolic panel (Completed)   TSH (Completed)   Lipid panel (Completed)   Family history of colon cancer    Colonoscopy 2010.  States had received notice and then spoke with GI.  Holding on any further screening.        Health care maintenance    Physical today 06/01/16.  Colonoscopy 06/09/09 - diverticulosis and internal hemorrhoids.  Mammogram 07/09/15 -Birads I.  Schedule f/u mammogram.       Stress    Increased stress as outlined.  Takes buspar prn.  Overall she feels she is doing relatively well.  Follow.        Vitamin D deficiency    Recheck vitamin D level.        Relevant Orders   VITAMIN D 25 Hydroxy (Vit-D Deficiency, Fractures) (Completed)    Other Visit Diagnoses    Breast cancer screening         Relevant Orders   MM DIGITAL SCREENING BILATERAL       Einar Pheasant, MD

## 2016-06-01 NOTE — Assessment & Plan Note (Signed)
Recheck vitamin D level 

## 2016-06-02 LAB — CBC WITH DIFFERENTIAL/PLATELET
BASOS ABS: 0 10*3/uL (ref 0.0–0.1)
Basophils Relative: 0.4 % (ref 0.0–3.0)
EOS ABS: 0.1 10*3/uL (ref 0.0–0.7)
Eosinophils Relative: 1.8 % (ref 0.0–5.0)
HCT: 45.2 % (ref 36.0–46.0)
Hemoglobin: 15.4 g/dL — ABNORMAL HIGH (ref 12.0–15.0)
LYMPHS ABS: 2.3 10*3/uL (ref 0.7–4.0)
Lymphocytes Relative: 29.7 % (ref 12.0–46.0)
MCHC: 34.1 g/dL (ref 30.0–36.0)
MCV: 93.4 fl (ref 78.0–100.0)
MONO ABS: 0.7 10*3/uL (ref 0.1–1.0)
Monocytes Relative: 8.4 % (ref 3.0–12.0)
NEUTROS ABS: 4.7 10*3/uL (ref 1.4–7.7)
NEUTROS PCT: 59.7 % (ref 43.0–77.0)
PLATELETS: 222 10*3/uL (ref 150.0–400.0)
RBC: 4.83 Mil/uL (ref 3.87–5.11)
RDW: 12.9 % (ref 11.5–15.5)
WBC: 7.9 10*3/uL (ref 4.0–10.5)

## 2016-06-04 ENCOUNTER — Encounter: Payer: Self-pay | Admitting: Internal Medicine

## 2016-06-04 NOTE — Assessment & Plan Note (Signed)
Blood pressure elevated on checks here.  Trending down prior to leaving.  See above and her attached list for her blood pressure checks.  Follow pressures.  Same medication regimen.  Follow metabolic panel.

## 2016-06-04 NOTE — Assessment & Plan Note (Signed)
Had mammogram 06/23/15 - Birads 0.  F/u mammogram 07/09/15 - Birads I.  Recommended f/u mammogram in one year.  Scheduled.

## 2016-06-05 ENCOUNTER — Other Ambulatory Visit: Payer: Self-pay | Admitting: Internal Medicine

## 2016-06-05 DIAGNOSIS — R739 Hyperglycemia, unspecified: Secondary | ICD-10-CM

## 2016-06-05 NOTE — Progress Notes (Signed)
Orders placed for f/u fasting glucose and a1c  

## 2016-07-04 ENCOUNTER — Other Ambulatory Visit (INDEPENDENT_AMBULATORY_CARE_PROVIDER_SITE_OTHER): Payer: PPO

## 2016-07-04 DIAGNOSIS — R739 Hyperglycemia, unspecified: Secondary | ICD-10-CM | POA: Diagnosis not present

## 2016-07-04 LAB — HEMOGLOBIN A1C: Hgb A1c MFr Bld: 5.7 % (ref 4.6–6.5)

## 2016-07-05 LAB — GLUCOSE, FASTING: GLUCOSE, FASTING: 110 mg/dL — AB (ref 65–99)

## 2016-07-07 ENCOUNTER — Telehealth: Payer: Self-pay | Admitting: Internal Medicine

## 2016-07-07 ENCOUNTER — Ambulatory Visit
Admission: RE | Admit: 2016-07-07 | Discharge: 2016-07-07 | Disposition: A | Discharge status: Home / Self Care | Payer: PPO | Source: Ambulatory Visit | Attending: Internal Medicine | Admitting: Internal Medicine

## 2016-07-07 DIAGNOSIS — Z1231 Encounter for screening mammogram for malignant neoplasm of breast: Secondary | ICD-10-CM | POA: Diagnosis not present

## 2016-07-07 DIAGNOSIS — Z1239 Encounter for other screening for malignant neoplasm of breast: Secondary | ICD-10-CM

## 2016-07-07 NOTE — Telephone Encounter (Signed)
Notified patient of results 

## 2016-07-07 NOTE — Telephone Encounter (Signed)
PT called returning your call in regards to lab results. Thank you!  Call pt @ [phone]

## 2016-07-11 DIAGNOSIS — L858 Other specified epidermal thickening: Secondary | ICD-10-CM | POA: Diagnosis not present

## 2016-07-11 DIAGNOSIS — D485 Neoplasm of uncertain behavior of skin: Secondary | ICD-10-CM | POA: Diagnosis not present

## 2016-07-11 DIAGNOSIS — Z85828 Personal history of other malignant neoplasm of skin: Secondary | ICD-10-CM | POA: Diagnosis not present

## 2016-07-11 DIAGNOSIS — L570 Actinic keratosis: Secondary | ICD-10-CM | POA: Diagnosis not present

## 2016-07-29 ENCOUNTER — Other Ambulatory Visit: Payer: Self-pay | Admitting: Internal Medicine

## 2016-08-21 DIAGNOSIS — L858 Other specified epidermal thickening: Secondary | ICD-10-CM | POA: Diagnosis not present

## 2016-08-21 DIAGNOSIS — L089 Local infection of the skin and subcutaneous tissue, unspecified: Secondary | ICD-10-CM | POA: Diagnosis not present

## 2016-08-21 DIAGNOSIS — D485 Neoplasm of uncertain behavior of skin: Secondary | ICD-10-CM | POA: Diagnosis not present

## 2016-08-21 DIAGNOSIS — C4492 Squamous cell carcinoma of skin, unspecified: Secondary | ICD-10-CM | POA: Diagnosis not present

## 2016-08-22 DIAGNOSIS — L905 Scar conditions and fibrosis of skin: Secondary | ICD-10-CM | POA: Diagnosis not present

## 2016-08-22 DIAGNOSIS — L578 Other skin changes due to chronic exposure to nonionizing radiation: Secondary | ICD-10-CM | POA: Diagnosis not present

## 2016-08-22 DIAGNOSIS — Z85828 Personal history of other malignant neoplasm of skin: Secondary | ICD-10-CM | POA: Diagnosis not present

## 2016-08-22 DIAGNOSIS — D492 Neoplasm of unspecified behavior of bone, soft tissue, and skin: Secondary | ICD-10-CM | POA: Diagnosis not present

## 2016-08-22 DIAGNOSIS — C44722 Squamous cell carcinoma of skin of right lower limb, including hip: Secondary | ICD-10-CM | POA: Diagnosis not present

## 2016-08-31 ENCOUNTER — Ambulatory Visit: Payer: PPO | Admitting: Internal Medicine

## 2016-09-22 ENCOUNTER — Other Ambulatory Visit: Payer: Self-pay | Admitting: Internal Medicine

## 2016-09-26 ENCOUNTER — Ambulatory Visit: Payer: PPO

## 2016-10-02 ENCOUNTER — Telehealth: Payer: Self-pay | Admitting: Internal Medicine

## 2016-10-02 DIAGNOSIS — H40153 Residual stage of open-angle glaucoma, bilateral: Secondary | ICD-10-CM | POA: Diagnosis not present

## 2016-10-02 NOTE — Telephone Encounter (Signed)
Please advise 

## 2016-10-02 NOTE — Telephone Encounter (Signed)
Ok to refill 

## 2016-10-02 NOTE — Telephone Encounter (Signed)
Patient states that she has been on it 2-3 times a week unless she is having a problem problem she takes everyday. She states that she has been on this for years and she does not know what the problem is this time.

## 2016-10-02 NOTE — Telephone Encounter (Signed)
This is not usually a medication we continue to refill.  Please ask pt if she is having persistent problems and how often she needs this medication.

## 2016-10-02 NOTE — Telephone Encounter (Signed)
Refilled on 09/22/16.  Should not need refill at this time.

## 2016-10-02 NOTE — Telephone Encounter (Signed)
Pt called needing a refill for Dukes mouth wash.   Pharmacy is [facility], [place] - [address]  Call pt @ [phone]. Thank you!

## 2016-10-16 DIAGNOSIS — Z85828 Personal history of other malignant neoplasm of skin: Secondary | ICD-10-CM | POA: Diagnosis not present

## 2016-11-21 ENCOUNTER — Encounter: Payer: Self-pay | Admitting: Internal Medicine

## 2016-11-21 ENCOUNTER — Ambulatory Visit (INDEPENDENT_AMBULATORY_CARE_PROVIDER_SITE_OTHER): Payer: PPO | Admitting: Internal Medicine

## 2016-11-21 VITALS — BP 188/108 | HR 86 | Temp 97.6°F | Resp 12 | Ht 63.0 in | Wt 112.8 lb

## 2016-11-21 DIAGNOSIS — I10 Essential (primary) hypertension: Secondary | ICD-10-CM | POA: Diagnosis not present

## 2016-11-21 DIAGNOSIS — F439 Reaction to severe stress, unspecified: Secondary | ICD-10-CM

## 2016-11-21 DIAGNOSIS — D582 Other hemoglobinopathies: Secondary | ICD-10-CM

## 2016-11-21 LAB — CBC WITH DIFFERENTIAL/PLATELET
BASOS PCT: 0.4 % (ref 0.0–3.0)
Basophils Absolute: 0 10*3/uL (ref 0.0–0.1)
Eosinophils Absolute: 0.1 10*3/uL (ref 0.0–0.7)
Eosinophils Relative: 2.1 % (ref 0.0–5.0)
HEMATOCRIT: 43.6 % (ref 36.0–46.0)
Hemoglobin: 14.9 g/dL (ref 12.0–15.0)
Lymphocytes Relative: 26.9 % (ref 12.0–46.0)
Lymphs Abs: 1.9 10*3/uL (ref 0.7–4.0)
MCHC: 34.2 g/dL (ref 30.0–36.0)
MCV: 93.4 fl (ref 78.0–100.0)
MONO ABS: 0.6 10*3/uL (ref 0.1–1.0)
Monocytes Relative: 8.1 % (ref 3.0–12.0)
NEUTROS ABS: 4.5 10*3/uL (ref 1.4–7.7)
Neutrophils Relative %: 62.5 % (ref 43.0–77.0)
PLATELETS: 229 10*3/uL (ref 150.0–400.0)
RBC: 4.67 Mil/uL (ref 3.87–5.11)
RDW: 13 % (ref 11.5–15.5)
WBC: 7.2 10*3/uL (ref 4.0–10.5)

## 2016-11-21 LAB — BASIC METABOLIC PANEL
BUN: 18 mg/dL (ref 6–23)
CHLORIDE: 104 meq/L (ref 96–112)
CO2: 28 meq/L (ref 19–32)
Calcium: 9.5 mg/dL (ref 8.4–10.5)
Creatinine, Ser: 0.8 mg/dL (ref 0.40–1.20)
GFR: 72.84 mL/min (ref >60.00)
Glucose, Bld: 100 mg/dL — ABNORMAL HIGH (ref 70–99)
Potassium: 4.3 mEq/L (ref 3.5–5.1)
Sodium: 137 mEq/L (ref 135–145)

## 2016-11-21 MED ORDER — BUSPIRONE HCL 5 MG PO TABS: 5.0000 mg | ORAL_TABLET | Freq: Every day | ORAL | 0 refills | Status: DC | PRN

## 2016-11-21 NOTE — Progress Notes (Signed)
Pre-visit discussion using our clinic review tool. No additional management support is needed unless otherwise documented below in the visit note.  

## 2016-11-21 NOTE — Progress Notes (Signed)
Patient ID: Claire Clayton, female   DOB: 1931-12-06, 83 y.o.   MRN: [id number]   Subjective:    Patient ID: Claire Clayton, female    DOB: Sep 25, 1931, 83 y.o.   MRN: [id number]  HPI  Patient here for a scheduled follow up.  Increased stress recently.  Her son-n-law has been diagnosed with cancer and is undergoing treatment.  Also recently had to have emergent surgery.  She is helping to care for him.  Also increased stress with her granddaughter's health issues. Things are starting to settle down some now.  She feels she is handling things relatively well.  No chest pain.  Stays active.  No sob.  No acid reflux.  No abdominal pain.  Bowels moving.  No nausea or vomiting.  Reviewed blood pressures from outside - averaging 108-140/70-80s.  Have checked her cuff and correlates.  Weight loss.  Discussed with her today.  States she has been busy and will not eat or not eat as much.  Feels good.     Past Medical History:  Diagnosis Date  . Cancer (HCC)    squamous cell skin  . Diverticulosis   . Glaucoma   . Hypertension   . Osteoporosis   . Seasonal allergies    Past Surgical History:  Procedure Laterality Date  . BREAST BIOPSY Left 1970   benign  . CATARACT EXTRACTION, BILATERAL    . SKIN CANCER EXCISION     carcinoma (3 surgeries)  . TONSILLECTOMY  1948   Family History  Problem Relation Age of Onset  . Colon cancer Brother   . Colon polyps Sister   . Breast cancer Neg Hx    Social History   Social History  . Marital status: Widowed    Spouse name: N/A  . Number of children: 3  . Years of education: N/A   Social History Main Topics  . Smoking status: Never Smoker  . Smokeless tobacco: Never Used  . Alcohol use 0.0 oz/week     Comment: rare - glass of wine  . Drug use: No  . Sexual activity: No   Other Topics Concern  . None   Social History Narrative  . None    Outpatient Encounter Prescriptions as of 11/21/2016  Medication Sig  . aspirin EC 81 MG tablet  Take 81 mg by mouth daily.  Marland Kitchen atenolol (TENORMIN) 25 MG tablet Take 1 tablet (25 mg total) by mouth daily.  . busPIRone (BUSPAR) 5 MG tablet Take 1 tablet (5 mg total) by mouth daily as needed.  . Calcium Carbonate-Vitamin D 600-400 MG-UNIT per tablet Take 1 tablet by mouth daily.   . Cetirizine HCl (ZYRTEC ALLERGY PO) Take by mouth.  . Fish Oil-Cholecalciferol (FISH OIL + D3 PO) Take by mouth daily.  . hydrocortisone (CORTEF) 10 MG tablet SWISH AND EXPECTORATE TWO TEASPOONFULS THREE TIMES A DAY.  Marland Kitchen lisinopril (PRINIVIL,ZESTRIL) 20 MG tablet Take 0.5 tablets (10 mg total) by mouth daily.  . Multiple Vitamins-Minerals (ICAPS PO) Take by mouth.  . timolol (BETIMOL) 0.5 % ophthalmic solution Place 1 drop into both eyes 2 (two) times daily.  . [DISCONTINUED] busPIRone (BUSPAR) 5 MG tablet Take 1 tablet (5 mg total) by mouth daily as needed.   No facility-administered encounter medications on file as of 11/21/2016.     Review of Systems  Constitutional: Negative for appetite change and unexpected weight change.  HENT: Negative for congestion and sinus pressure.   Respiratory: Negative for cough, chest tightness and  shortness of breath.   Cardiovascular: Negative for chest pain, palpitations and leg swelling.  Gastrointestinal: Negative for abdominal pain, diarrhea, nausea and vomiting.  Genitourinary: Negative for difficulty urinating and dysuria.  Musculoskeletal: Negative for back pain and joint swelling.  Skin: Negative for color change and rash.  Neurological: Negative for dizziness, light-headedness and headaches.  Psychiatric/Behavioral: Negative for agitation and dysphoric mood.       Increased stress as outlined.         Objective:     Blood pressure rechecked by me:  148-152/84  Physical Exam  Constitutional: She appears well-developed and well-nourished. No distress.  HENT:  Nose: Nose normal.  Mouth/Throat: Oropharynx is clear and moist.  Neck: Neck supple. No thyromegaly  present.  Cardiovascular: Normal rate and regular rhythm.   Pulmonary/Chest: Breath sounds normal. No respiratory distress. She has no wheezes.  Abdominal: Soft. Bowel sounds are normal. There is no tenderness.  Musculoskeletal: She exhibits no edema or tenderness.  Lymphadenopathy:    She has no cervical adenopathy.  Skin: No rash noted. No erythema.  Psychiatric: She has a normal mood and affect. Her behavior is normal.    BP (!) 188/108 (BP Location: Left Arm, Patient Position: Sitting, Cuff Size: Normal)   Pulse 86   Temp 97.6 F (36.4 C) (Oral)   Resp 12   Ht 5\' 3"  (1.6 m)   Wt 112 lb 12.8 oz (51.2 kg)   SpO2 97%   BMI 19.98 kg/m  Wt Readings from Last 3 Encounters:  11/21/16 112 lb 12.8 oz (51.2 kg)  06/01/16 116 lb (52.6 kg)  01/26/16 115 lb 4 oz (52.3 kg)     Lab Results  Component Value Date   WBC 7.2 11/21/2016   HGB 14.9 11/21/2016   HCT 43.6 11/21/2016   PLT 229.0 11/21/2016   GLUCOSE 100 (H) 11/21/2016   CHOL 176 06/01/2016   TRIG 121.0 06/01/2016   HDL 61.80 06/01/2016   LDLCALC 90 06/01/2016   ALT 15 06/01/2016   AST 17 06/01/2016   NA 137 11/21/2016   K 4.3 11/21/2016   CL 104 11/21/2016   CREATININE 0.80 11/21/2016   BUN 18 11/21/2016   CO2 28 11/21/2016   TSH 4.01 06/01/2016   HGBA1C 5.7 07/04/2016    Mm Digital Screening Bilateral  Result Date: 07/07/2016 CLINICAL DATA:  Screening. EXAM: DIGITAL SCREENING BILATERAL MAMMOGRAM WITH CAD COMPARISON:  Previous exam(s). ACR Breast Density Category c: The breast tissue is heterogeneously dense, which may obscure small masses. FINDINGS: There are no findings suspicious for malignancy. Images were processed with CAD. IMPRESSION: No mammographic evidence of malignancy. A result letter of this screening mammogram will be mailed directly to the patient. RECOMMENDATION: Screening mammogram in one year. (Code:SM-B-01Y) BI-RADS CATEGORY  1: Negative. Electronically Signed   By: Curlene Dolphin M.D.   On:  07/07/2016 16:26       Assessment & Plan:   Problem List Items Addressed This Visit    Essential hypertension, benign - Primary    Elevated here.  Outside checks reviewed and under good control.  Have checked her cuff and correlates.  Follow pressures.  Follow metabolic panel.        Relevant Orders   Basic metabolic panel (Completed)   Stress    Increased stress as outlined.  Discussed at length with her today.  Doing better.  Follow.         Other Visit Diagnoses    Elevated hemoglobin ([place])  Relevant Orders   CBC with Differential/Platelet (Completed)       Einar Pheasant, MD

## 2016-11-22 NOTE — Assessment & Plan Note (Signed)
Increased stress as outlined.  Discussed at length with her today.  Doing better.  Follow.

## 2016-11-22 NOTE — Assessment & Plan Note (Signed)
Elevated here.  Outside checks reviewed and under good control.  Have checked her cuff and correlates.  Follow pressures.  Follow metabolic panel.

## 2017-01-29 ENCOUNTER — Encounter: Payer: Self-pay | Admitting: Internal Medicine

## 2017-01-29 ENCOUNTER — Ambulatory Visit (INDEPENDENT_AMBULATORY_CARE_PROVIDER_SITE_OTHER): Payer: PPO | Admitting: Internal Medicine

## 2017-01-29 VITALS — BP 160/80 | HR 88 | Temp 98.6°F | Resp 12 | Ht 63.0 in | Wt 112.8 lb

## 2017-01-29 DIAGNOSIS — F439 Reaction to severe stress, unspecified: Secondary | ICD-10-CM

## 2017-01-29 DIAGNOSIS — I10 Essential (primary) hypertension: Secondary | ICD-10-CM | POA: Diagnosis not present

## 2017-01-29 DIAGNOSIS — R739 Hyperglycemia, unspecified: Secondary | ICD-10-CM | POA: Diagnosis not present

## 2017-01-29 DIAGNOSIS — E559 Vitamin D deficiency, unspecified: Secondary | ICD-10-CM

## 2017-01-29 DIAGNOSIS — Z23 Encounter for immunization: Secondary | ICD-10-CM | POA: Diagnosis not present

## 2017-01-29 NOTE — Assessment & Plan Note (Signed)
Continue vitamin D supplements.  Follow vitamin D level.   

## 2017-01-29 NOTE — Progress Notes (Signed)
Pre-visit discussion using our clinic review tool. No additional management support is needed unless otherwise documented below in the visit note.  

## 2017-01-29 NOTE — Assessment & Plan Note (Signed)
Increased stress as outlined.  Better.  Doing better.  Follow.

## 2017-01-29 NOTE — Assessment & Plan Note (Signed)
Blood pressure elevated here.  Under good control on outside checks.  Same medication regimen.  Follow pressures.  Follow metabolic panel.

## 2017-01-29 NOTE — Progress Notes (Signed)
Patient ID: Claire Clayton, female   DOB: 03/11/1932, 83 y.o.   MRN: [id number]   Subjective:    Patient ID: Claire Clayton, female    DOB: [birth year], 83 y.o.   MRN: [id number]  HPI  Patient here for a scheduled follow up.  She reports she is doing better.  Stress is better.  Her granddaughter is doing better.  She stays active.  No chest pain.  No sob.  No acid reflux.  No abdominal pain.  Bowels moving.  Blood pressures averaging 115-130s/70-80s.     Past Medical History:  Diagnosis Date  . Cancer (HCC)    squamous cell skin  . Diverticulosis   . Glaucoma   . Hypertension   . Osteoporosis   . Seasonal allergies    Past Surgical History:  Procedure Laterality Date  . BREAST BIOPSY Left 1970   benign  . CATARACT EXTRACTION, BILATERAL    . SKIN CANCER EXCISION     carcinoma (3 surgeries)  . TONSILLECTOMY  1948   Family History  Problem Relation Age of Onset  . Colon cancer Brother   . Colon polyps Sister   . Breast cancer Neg Hx    Social History   Social History  . Marital status: Widowed    Spouse name: Claire Clayton  . Number of children: 3  . Years of education: Claire Clayton   Social History Main Topics  . Smoking status: Never Smoker  . Smokeless tobacco: Never Used  . Alcohol use 0.0 oz/week     Comment: rare - glass of wine  . Drug use: No  . Sexual activity: No   Other Topics Concern  . None   Social History Narrative  . None    Outpatient Encounter Prescriptions as of 01/29/2017  Medication Sig  . aspirin EC 81 MG tablet Take 81 mg by mouth daily.  Marland Kitchen atenolol (TENORMIN) 25 MG tablet Take 1 tablet (25 mg total) by mouth daily.  . busPIRone (BUSPAR) 5 MG tablet Take 1 tablet (5 mg total) by mouth daily as needed.  . Calcium Carbonate-Vitamin D 600-400 MG-UNIT per tablet Take 1 tablet by mouth daily.   . Fish Oil-Cholecalciferol (FISH OIL + D3 PO) Take by mouth daily.  . hydrocortisone (CORTEF) 10 MG tablet SWISH AND EXPECTORATE TWO TEASPOONFULS THREE TIMES A  DAY.  Marland Kitchen lisinopril (PRINIVIL,ZESTRIL) 20 MG tablet Take 0.5 tablets (10 mg total) by mouth daily.  . Multiple Vitamins-Minerals (ICAPS PO) Take by mouth.  . timolol (BETIMOL) 0.5 % ophthalmic solution Place 1 drop into both eyes 2 (two) times daily.  . [DISCONTINUED] Cetirizine HCl (ZYRTEC ALLERGY PO) Take by mouth.   No facility-administered encounter medications on file as of 01/29/2017.     Review of Systems  Constitutional: Negative for appetite change and unexpected weight change.       Weight is stable.  She is eating.    HENT: Negative for congestion and sinus pressure.   Respiratory: Negative for cough, chest tightness and shortness of breath.   Cardiovascular: Negative for chest pain, palpitations and leg swelling.  Gastrointestinal: Negative for abdominal pain, diarrhea, nausea and vomiting.  Genitourinary: Negative for difficulty urinating and dysuria.  Musculoskeletal: Negative for back pain and joint swelling.  Skin: Negative for color change and rash.  Neurological: Negative for dizziness, light-headedness and headaches.  Psychiatric/Behavioral: Negative for agitation and dysphoric mood.       Objective:    Physical Exam  Constitutional: She appears well-developed and well-nourished. No  distress.  HENT:  Nose: Nose normal.  Mouth/Throat: Oropharynx is clear and moist.  Neck: Neck supple. No thyromegaly present.  Cardiovascular: Normal rate and regular rhythm.   Pulmonary/Chest: Breath sounds normal. No respiratory distress. She has no wheezes.  Abdominal: Soft. Bowel sounds are normal. There is no tenderness.  Musculoskeletal: She exhibits no edema or tenderness.  Lymphadenopathy:    She has no cervical adenopathy.  Skin: No rash noted. No erythema.  Psychiatric: She has a normal mood and affect. Her behavior is normal.    BP (!) 160/80 (BP Location: Left Arm, Patient Position: Sitting, Cuff Size: Normal)   Pulse 88   Temp 98.6 F (37 C) (Oral)   Resp 12    Ht 5\' 3"  (1.6 m)   Wt 112 lb 12.8 oz (51.2 kg)   SpO2 98%   BMI 19.98 kg/m  Wt Readings from Last 3 Encounters:  01/29/17 112 lb 12.8 oz (51.2 kg)  11/21/16 112 lb 12.8 oz (51.2 kg)  06/01/16 116 lb (52.6 kg)     Lab Results  Component Value Date   WBC 7.2 11/21/2016   HGB 14.9 11/21/2016   HCT 43.6 11/21/2016   PLT 229.0 11/21/2016   GLUCOSE 100 (H) 11/21/2016   CHOL 176 06/01/2016   TRIG 121.0 06/01/2016   HDL 61.80 06/01/2016   LDLCALC 90 06/01/2016   ALT 15 06/01/2016   AST 17 06/01/2016   NA 137 11/21/2016   K 4.3 11/21/2016   CL 104 11/21/2016   CREATININE 0.80 11/21/2016   BUN 18 11/21/2016   CO2 28 11/21/2016   TSH 4.01 06/01/2016   HGBA1C 5.7 07/04/2016    Mm Digital Screening Bilateral  Result Date: 07/07/2016 CLINICAL DATA:  Screening. EXAM: DIGITAL SCREENING BILATERAL MAMMOGRAM WITH CAD COMPARISON:  Previous exam(s). ACR Breast Density Category c: The breast tissue is heterogeneously dense, which may obscure small masses. FINDINGS: There are no findings suspicious for malignancy. Images were processed with CAD. IMPRESSION: No mammographic evidence of malignancy. A result letter of this screening mammogram will be mailed directly to the patient. RECOMMENDATION: Screening mammogram in one year. (Code:SM-B-01Y) BI-RADS CATEGORY  1: Negative. Electronically Signed   By: Curlene Dolphin M.D.   On: 07/07/2016 16:26       Assessment & Plan:   Problem List Items Addressed This Visit    Essential hypertension, benign    Blood pressure elevated here.  Under good control on outside checks.  Same medication regimen.  Follow pressures.  Follow metabolic panel.       Relevant Orders   TSH   Lipid panel   Hepatic function panel   Basic metabolic panel   Stress    Increased stress as outlined.  Better.  Doing better.  Follow.        Vitamin D deficiency    Continue vitamin D supplements.  Follow vitamin D level.        Other Visit Diagnoses    Need for  pneumococcal vaccination    -  Primary   Hyperglycemia       Relevant Orders   Hemoglobin A1c       Claire Pheasant, MD

## 2017-02-21 ENCOUNTER — Telehealth: Payer: Self-pay | Admitting: Internal Medicine

## 2017-02-21 NOTE — Telephone Encounter (Signed)
Left pt message asking to call Ebony Hail back directly at [phone] to schedule AWV. Thanks!  *NOTE* Last AWV 03/25/13

## 2017-02-28 DIAGNOSIS — H40153 Residual stage of open-angle glaucoma, bilateral: Secondary | ICD-10-CM | POA: Diagnosis not present

## 2017-03-28 NOTE — Telephone Encounter (Signed)
Left pt message asking to call Ebony Hail back directly at [phone] to schedule AWV. Thanks!  *NOTE* Last AWV 03/25/13  **CPE is in 05/2017-may be able to add to that appt or her cpe lab appt*

## 2017-04-11 NOTE — Telephone Encounter (Signed)
Scheduled AWV 05/30/17. Pt aware

## 2017-04-28 ENCOUNTER — Other Ambulatory Visit: Payer: Self-pay | Admitting: Internal Medicine

## 2017-05-30 ENCOUNTER — Ambulatory Visit (INDEPENDENT_AMBULATORY_CARE_PROVIDER_SITE_OTHER): Payer: PPO

## 2017-05-30 ENCOUNTER — Other Ambulatory Visit: Payer: PPO

## 2017-05-30 VITALS — BP 148/80 | HR 83 | Temp 98.0°F | Resp 14 | Ht 63.0 in | Wt 115.8 lb

## 2017-05-30 DIAGNOSIS — I10 Essential (primary) hypertension: Secondary | ICD-10-CM | POA: Diagnosis not present

## 2017-05-30 DIAGNOSIS — R739 Hyperglycemia, unspecified: Secondary | ICD-10-CM | POA: Diagnosis not present

## 2017-05-30 DIAGNOSIS — Z1331 Encounter for screening for depression: Secondary | ICD-10-CM | POA: Diagnosis not present

## 2017-05-30 DIAGNOSIS — Z0000 Encounter for general adult medical examination without abnormal findings: Secondary | ICD-10-CM

## 2017-05-30 LAB — BASIC METABOLIC PANEL
BUN: 16 mg/dL (ref 6–23)
CO2: 31 mEq/L (ref 19–32)
CREATININE: 0.8 mg/dL (ref 0.40–1.20)
Calcium: 9.7 mg/dL (ref 8.4–10.5)
Chloride: 100 mEq/L (ref 96–112)
GFR: 72.75 mL/min (ref >60.00)
GLUCOSE: 96 mg/dL (ref 70–99)
Potassium: 4.2 mEq/L (ref 3.5–5.1)
Sodium: 137 mEq/L (ref 135–145)

## 2017-05-30 LAB — HEPATIC FUNCTION PANEL
ALBUMIN: 4.3 g/dL (ref 3.5–5.2)
ALT: 18 U/L (ref 0–35)
AST: 20 U/L (ref 0–37)
Alkaline Phosphatase: 85 U/L (ref 39–117)
Bilirubin, Direct: 0.1 mg/dL (ref 0.0–0.3)
TOTAL PROTEIN: 7.2 g/dL (ref 6.0–8.3)
Total Bilirubin: 0.6 mg/dL (ref 0.2–1.2)

## 2017-05-30 LAB — LIPID PANEL
CHOLESTEROL: 179 mg/dL (ref 0–200)
HDL: 65.9 mg/dL (ref >39.00)
LDL Cholesterol: 97 mg/dL (ref 0–99)
NonHDL: 113.43
Total CHOL/HDL Ratio: 3
Triglycerides: 81 mg/dL (ref 0.0–149.0)
VLDL: 16.2 mg/dL (ref 0.0–40.0)

## 2017-05-30 LAB — TSH: TSH: 7.57 u[IU]/mL — AB (ref 0.35–4.50)

## 2017-05-30 LAB — HEMOGLOBIN A1C: HEMOGLOBIN A1C: 5.8 % (ref 4.6–6.5)

## 2017-05-30 NOTE — Progress Notes (Signed)
Subjective:   Claire Clayton is a 83 y.o. female who presents for Medicare Annual (Subsequent) preventive examination.  Review of Systems:  No ROS.  Medicare Wellness Visit. Additional risk factors are reflected in the social history. Cardiac Risk Factors include: advanced age (>28men, >64 women);hypertension     Objective:     Vitals: BP (!) 148/80 (BP Location: Left Arm, Patient Position: Sitting, Cuff Size: Normal)   Pulse 83   Temp 98 F (36.7 C) (Oral)   Resp 14   Ht 5\' 3"  (1.6 m)   Wt 115 lb 12.8 oz (52.5 kg)   SpO2 97%   BMI 20.51 kg/m   Body mass index is 20.51 kg/m.  Advanced Directives 05/30/2017  Does Patient Have a Medical Advance Directive? Yes  Type of Advance Directive Living will;Healthcare Power of Attorney  Does patient want to make changes to medical advance directive? No - Patient declined  Copy of [place] in Chart? No - copy requested    Tobacco Social History   Tobacco Use  Smoking Status Never Smoker  Smokeless Tobacco Never Used     Counseling given: Not Answered   Clinical Intake:  Pre-visit preparation completed: Yes  Pain : No/denies pain     Nutritional Status: BMI of 19-24  Normal Diabetes: No  Activities of Daily Living: Independent Ambulation: Independent Medication Administration: Independent Home Management: Independent     Do you feel unsafe in your current relationship?: No Do you feel physically threatened by others?: No Anyone hurting you at home, work, or school?: No Unable to ask?: No Information provided on Community resources: No  How often do you need to have someone help you when you read instructions, pamphlets, or other written materials from your doctor or pharmacy?: 1 - Never  Interpreter Needed?: No     Past Medical History:  Diagnosis Date  . Cancer (HCC)    squamous cell skin  . Diverticulosis   . Glaucoma   . Hypertension   . Osteoporosis   . Seasonal  allergies    Past Surgical History:  Procedure Laterality Date  . BREAST BIOPSY Left 1970   benign  . CATARACT EXTRACTION, BILATERAL    . SKIN CANCER EXCISION     carcinoma (3 surgeries)  . TONSILLECTOMY  1948   Family History  Problem Relation Age of Onset  . Colon cancer Brother   . Colon polyps Sister   . Breast cancer Neg Hx    Social History   Socioeconomic History  . Marital status: Widowed    Spouse name: None  . Number of children: 3  . Years of education: None  . Highest education level: None  Social Needs  . Financial resource strain: None  . Food insecurity - worry: None  . Food insecurity - inability: None  . Transportation needs - medical: None  . Transportation needs - non-medical: None  Occupational History  . None  Tobacco Use  . Smoking status: Never Smoker  . Smokeless tobacco: Never Used  Substance and Sexual Activity  . Alcohol use: Yes    Alcohol/week: 0.0 oz    Comment: rare - glass of wine  . Drug use: No  . Sexual activity: No  Other Topics Concern  . None  Social History Narrative  . None    Outpatient Encounter Medications as of 05/30/2017  Medication Sig  . aspirin EC 81 MG tablet Take 81 mg by mouth daily.  Marland Kitchen atenolol (TENORMIN) 25  MG tablet Take 1 tablet (25 mg total) by mouth daily.  . busPIRone (BUSPAR) 5 MG tablet Take 1 tablet (5 mg total) by mouth daily as needed.  . Calcium Carbonate-Vitamin D 600-400 MG-UNIT per tablet Take 1 tablet by mouth daily.   . Fish Oil-Cholecalciferol (FISH OIL + D3 PO) Take by mouth daily.  . hydrocortisone (CORTEF) 10 MG tablet SWISH AND EXPECTORATE TWO TEASPOONFULS THREE TIMES A DAY.  Marland Kitchen lisinopril (PRINIVIL,ZESTRIL) 20 MG tablet Take 0.5 tablets (10 mg total) by mouth daily.  . Multiple Vitamins-Minerals (ICAPS PO) Take by mouth.  . timolol (BETIMOL) 0.5 % ophthalmic solution Place 1 drop into both eyes 2 (two) times daily.   No facility-administered encounter medications on file as of  05/30/2017.     Activities of Daily Living In your present state of health, do you have any difficulty performing the following activities: 05/30/2017  Hearing? N  Vision? N  Difficulty concentrating or making decisions? N  Walking or climbing stairs? N  Dressing or bathing? N  Doing errands, shopping? N  Preparing Food and eating ? N  Using the Toilet? N  In the past six months, have you accidently leaked urine? N  Do you have problems with loss of bowel control? N  Managing your Medications? N  Managing your Finances? N  Housekeeping or managing your Housekeeping? N  Some recent data might be hidden   Patient Care Team: Einar Pheasant, MD as PCP - General (Internal Medicine)    Assessment:    This is a routine wellness examination for [name]. The goal of the wellness visit is to assist the patient how to close the gaps in care and create a preventative care plan for the patient.   The roster of all physicians providing medical care to patient is listed in the Snapshot section of the chart.  Taking calcium VIT D as appropriate/Osteoporosis reviewed.    Safety issues reviewed; Smoke and carbon monoxide detectors in the home. No firearms in the home.  Wears seatbelts when driving or riding with others. Patient does wear sunscreen or protective clothing when in direct sunlight. No violence in the home.  Depression- PHQ 2 &9 complete.  No signs/symptoms or verbal communication regarding little pleasure in doing things, feeling down, depressed or hopeless. No changes in sleeping, energy, eating, concentrating.  No thoughts of self harm or harm towards others.  Time spent on this topic is 8 minutes.   Patient is alert, normal appearance, oriented to person/place/and time.  Correctly identified the president of the [place], recall of 3/3 words, and performing simple calculations. Displays appropriate judgement and can read correct time from watch face.   No new identified risk were  noted.  No failures at ADL's or IADL's.    BMI- discussed the importance of a healthy diet, water intake and the benefits of aerobic exercise. Educational material provided.   24 hour diet recall: Low carb diet  Daily fluid intake:  2 cups of caffeine, 4 cups of water  Dental- every 6 months.  Eye- Visual acuity not assessed per patient preference since they have regular follow up with the ophthalmologist.  Wears corrective lenses.  Sleep patterns- Sleeps 4-6 hours at night.    Fasting labs completed today.  TDAP vaccine deferred per patient preference.  Follow up with insurance.  Educational material provided.  Patient Concerns: None at this time. Follow up with PCP as needed.  Exercise Activities and Dietary recommendations Current Exercise Habits: Home exercise routine,  Type of exercise: calisthenics, Time (Minutes): 30, Intensity: Moderate  Goals    . Healthy Lifestyle     Stay hydrated Low carb diet Ride exercise bike 4 times per week, 20 minutes      Fall Risk Fall Risk  05/30/2017 11/21/2016 05/17/2015 05/08/2014 03/25/2013  Falls in the past year? No No No No No  Risk for fall due to : - (No Data) - - -  Risk for fall due to: Comment - no falls  - - -   Depression Screen PHQ 2/9 Scores 05/30/2017 11/21/2016 11/21/2016 05/17/2015  PHQ - 2 Score 0 1 0 0  PHQ- 9 Score - 2 - -     Cognitive Function     6CIT Screen 05/30/2017  What Year? 0 points  What month? 0 points  What time? 0 points  Count back from 20 0 points  Months in reverse 0 points  Repeat phrase 0 points  Total Score 0    Immunization History  Administered Date(s) Administered  . Influenza Split 03/06/2014  . Influenza,inj,Quad PF,6+ Mos 03/25/2013, 05/17/2015  . Influenza-Unspecified 05/12/2016  . Pneumococcal Conjugate-13 01/29/2017   Screening Tests Health Maintenance  Topic Date Due  . TETANUS/TDAP  01/23/1953  . INFLUENZA VACCINE  01/17/2017  . MAMMOGRAM  07/07/2017  . PNA vac  Low Risk Adult (2 of 2 - PPSV23) 01/29/2018  . DEXA SCAN  Completed      Plan:    End of life planning; Advance aging; Advanced directives discussed. Copy of current HCPOA/Living Will requested.    I have personally reviewed and noted the following in the patient's chart:   . Medical and social history . Use of alcohol, tobacco or illicit drugs  . Current medications and supplements . Functional ability and status . Nutritional status . Physical activity . Advanced directives . List of other physicians . Hospitalizations, surgeries, and ER visits in previous 12 months . Vitals . Screenings to include cognitive, depression, and falls . Referrals and appointments  In addition, I have reviewed and discussed with patient certain preventive protocols, quality metrics, and best practice recommendations. A written personalized care plan for preventive services as well as general preventive health recommendations were provided to patient.    OBrien-Blaney, Kern Gingras L, LPN  03/[id number]    I have reviewed the above information and agree with above.   Deborra Medina, MD

## 2017-05-30 NOTE — Patient Instructions (Addendum)
  Claire Clayton , Thank you for taking time to come for your Medicare Wellness Visit. I appreciate your ongoing commitment to your health goals. Please review the following plan we discussed and let me know if I can assist you in the future.   Return for physical with Dr. Nicki Reaper.  These are the goals we discussed: Goals    . Healthy Lifestyle     Stay hydrated Low carb diet Ride exercise bike 4 times per week, 20 minutes       This is a list of the screening recommended for you and due dates:  Health Maintenance  Topic Date Due  . Tetanus Vaccine  01/23/1953  . Flu Shot  01/17/2017  . Mammogram  07/07/2017  . Pneumonia vaccines (2 of 2 - PPSV23) 01/29/2018  . DEXA scan (bone density measurement)  Completed

## 2017-06-01 ENCOUNTER — Encounter: Payer: Self-pay | Admitting: Internal Medicine

## 2017-06-01 ENCOUNTER — Ambulatory Visit (INDEPENDENT_AMBULATORY_CARE_PROVIDER_SITE_OTHER): Payer: PPO | Admitting: Internal Medicine

## 2017-06-01 VITALS — BP 164/92 | HR 82 | Temp 97.8°F | Ht 63.0 in | Wt 115.8 lb

## 2017-06-01 DIAGNOSIS — R7989 Other specified abnormal findings of blood chemistry: Secondary | ICD-10-CM

## 2017-06-01 DIAGNOSIS — Z0000 Encounter for general adult medical examination without abnormal findings: Secondary | ICD-10-CM

## 2017-06-01 DIAGNOSIS — M810 Age-related osteoporosis without current pathological fracture: Secondary | ICD-10-CM | POA: Diagnosis not present

## 2017-06-01 DIAGNOSIS — I10 Essential (primary) hypertension: Secondary | ICD-10-CM

## 2017-06-01 DIAGNOSIS — E559 Vitamin D deficiency, unspecified: Secondary | ICD-10-CM

## 2017-06-01 DIAGNOSIS — Z1231 Encounter for screening mammogram for malignant neoplasm of breast: Secondary | ICD-10-CM

## 2017-06-01 DIAGNOSIS — F439 Reaction to severe stress, unspecified: Secondary | ICD-10-CM

## 2017-06-01 DIAGNOSIS — Z1239 Encounter for other screening for malignant neoplasm of breast: Secondary | ICD-10-CM

## 2017-06-01 NOTE — Progress Notes (Signed)
Patient ID: Claire Clayton, female   DOB: [birth year], 83 y.o.   MRN: [id number]   Subjective:    Patient ID: Claire Clayton, female    DOB: 11-16-1931, 83 y.o.   MRN: [id number]  HPI  Patient with past history of hypertension.  She comes in today to follow up on these issues as well as for a complete physical exam.  She reports she is doing well  Stress is better.  Feels she is doing better.  No chest pain.  No sob.  No acid reflux.  No abdominal pain.  bowles moving. Reviewed blood pressures on outside checks.  Blood pressures averaging 120-140/70-80s.  Discussed lab results.     Past Medical History:  Diagnosis Date  . Cancer (HCC)    squamous cell skin  . Diverticulosis   . Glaucoma   . Hypertension   . Osteoporosis   . Seasonal allergies    Past Surgical History:  Procedure Laterality Date  . BREAST BIOPSY Left 1970   benign  . CATARACT EXTRACTION, BILATERAL    . SKIN CANCER EXCISION     carcinoma (3 surgeries)  . TONSILLECTOMY  1948   Family History  Problem Relation Age of Onset  . Colon cancer Brother   . Colon polyps Sister   . Breast cancer Neg Hx    Social History   Socioeconomic History  . Marital status: Widowed    Spouse name: None  . Number of children: 3  . Years of education: None  . Highest education level: None  Social Needs  . Financial resource strain: None  . Food insecurity - worry: None  . Food insecurity - inability: None  . Transportation needs - medical: None  . Transportation needs - non-medical: None  Occupational History  . None  Tobacco Use  . Smoking status: Never Smoker  . Smokeless tobacco: Never Used  Substance and Sexual Activity  . Alcohol use: Yes    Alcohol/week: 0.0 oz    Comment: rare - glass of wine  . Drug use: No  . Sexual activity: No  Other Topics Concern  . None  Social History Narrative  . None    Outpatient Encounter Medications as of 06/01/2017  Medication Sig  . aspirin EC 81 MG tablet Take 81  mg by mouth daily.  Marland Kitchen atenolol (TENORMIN) 25 MG tablet Take 1 tablet (25 mg total) by mouth daily.  . busPIRone (BUSPAR) 5 MG tablet Take 1 tablet (5 mg total) by mouth daily as needed.  . Calcium Carbonate-Vitamin D 600-400 MG-UNIT per tablet Take 1 tablet by mouth daily.   . Fish Oil-Cholecalciferol (FISH OIL + D3 PO) Take by mouth daily.  . hydrocortisone (CORTEF) 10 MG tablet SWISH AND EXPECTORATE TWO TEASPOONFULS THREE TIMES A DAY.  Marland Kitchen lisinopril (PRINIVIL,ZESTRIL) 20 MG tablet Take 0.5 tablets (10 mg total) by mouth daily.  . Multiple Vitamins-Minerals (ICAPS PO) Take by mouth.  . timolol (BETIMOL) 0.5 % ophthalmic solution Place 1 drop into both eyes 2 (two) times daily.   No facility-administered encounter medications on file as of 06/01/2017.     Review of Systems  Constitutional: Negative for appetite change and unexpected weight change.  HENT: Negative for congestion and sinus pressure.   Eyes: Negative for pain and visual disturbance.  Respiratory: Negative for cough, chest tightness and shortness of breath.   Cardiovascular: Negative for chest pain, palpitations and leg swelling.  Gastrointestinal: Negative for abdominal pain, diarrhea, nausea and vomiting.  Genitourinary:  Negative for difficulty urinating and dysuria.  Musculoskeletal: Negative for back pain and joint swelling.  Skin: Negative for color change and rash.  Neurological: Negative for dizziness, light-headedness and headaches.  Hematological: Negative for adenopathy. Does not bruise/bleed easily.  Psychiatric/Behavioral: Negative for agitation and dysphoric mood.       Objective:    Physical Exam  Constitutional: She is oriented to person, place, and time. She appears well-developed and well-nourished. No distress.  HENT:  Nose: Nose normal.  Mouth/Throat: Oropharynx is clear and moist.  Eyes: Right eye exhibits no discharge. Left eye exhibits no discharge. No scleral icterus.  Neck: Neck supple. No  thyromegaly present.  Cardiovascular: Normal rate and regular rhythm.  Pulmonary/Chest: Breath sounds normal. No accessory muscle usage. No tachypnea. No respiratory distress. She has no decreased breath sounds. She has no wheezes. She has no rhonchi. Right breast exhibits no inverted nipple, no mass, no nipple discharge and no tenderness (no axillary adenopathy). Left breast exhibits no inverted nipple, no mass, no nipple discharge and no tenderness (no axilarry adenopathy).  Abdominal: Soft. Bowel sounds are normal. There is no tenderness.  Musculoskeletal: She exhibits no edema or tenderness.  Lymphadenopathy:    She has no cervical adenopathy.  Neurological: She is alert and oriented to person, place, and time.  Skin: Skin is warm. No rash noted. No erythema.  Psychiatric: She has a normal mood and affect. Her behavior is normal.    BP (!) 164/92 (BP Location: Left Arm, Patient Position: Sitting, Cuff Size: Normal)   Pulse 82   Temp 97.8 F (36.6 C) (Oral)   Ht 5\' 3"  (1.6 m)   Wt 115 lb 12.8 oz (52.5 kg)   BMI 20.51 kg/m  Wt Readings from Last 3 Encounters:  06/01/17 115 lb 12.8 oz (52.5 kg)  05/30/17 115 lb 12.8 oz (52.5 kg)  01/29/17 112 lb 12.8 oz (51.2 kg)     Lab Results  Component Value Date   WBC 7.2 11/21/2016   HGB 14.9 11/21/2016   HCT 43.6 11/21/2016   PLT 229.0 11/21/2016   GLUCOSE 96 05/30/2017   CHOL 179 05/30/2017   TRIG 81.0 05/30/2017   HDL 65.90 05/30/2017   LDLCALC 97 05/30/2017   ALT 18 05/30/2017   AST 20 05/30/2017   NA 137 05/30/2017   K 4.2 05/30/2017   CL 100 05/30/2017   CREATININE 0.80 05/30/2017   BUN 16 05/30/2017   CO2 31 05/30/2017   TSH 7.57 (H) 05/30/2017   HGBA1C 5.8 05/30/2017    Mm Digital Screening Bilateral  Result Date: 07/07/2016 CLINICAL DATA:  Screening. EXAM: DIGITAL SCREENING BILATERAL MAMMOGRAM WITH CAD COMPARISON:  Previous exam(s). ACR Breast Density Category c: The breast tissue is heterogeneously dense, which may  obscure small masses. FINDINGS: There are no findings suspicious for malignancy. Images were processed with CAD. IMPRESSION: No mammographic evidence of malignancy. A result letter of this screening mammogram will be mailed directly to the patient. RECOMMENDATION: Screening mammogram in one year. (Code:SM-B-01Y) BI-RADS CATEGORY  1: Negative. Electronically Signed   By: Curlene Dolphin M.D.   On: 07/07/2016 16:26       Assessment & Plan:   Problem List Items Addressed This Visit    Essential hypertension, benign    Blood pressure on recheck improved.  Blood pressures as outlined.  Same medication regimen.  Follow pressures.  Follow metabolic panel.        Health care maintenance    Physical not performed today.  Colonoscopy  06/09/09 - diverticulosis with internal hemorrhoids.  Mammogram 07/07/16 - Birads I.        Osteoporosis    She declines further treatment options.  Continue weight bearing exercise. Dietary calcium.        Stress    Stress is better.  Follow.        Vitamin D deficiency    Continue vitamin D supplements.  Follow vitamin D level.        Other Visit Diagnoses    Elevated TSH    -  Primary   recheck tsh.  tsh slightly increased.     Relevant Orders   TSH   Breast cancer screening       Relevant Orders   MM DIGITAL SCREENING BILATERAL       Einar Pheasant, MD

## 2017-06-01 NOTE — Assessment & Plan Note (Addendum)
Physical not performed today.  Colonoscopy 06/09/09 - diverticulosis with internal hemorrhoids.  Mammogram 07/07/16 - Birads I.

## 2017-06-03 ENCOUNTER — Encounter: Payer: Self-pay | Admitting: Internal Medicine

## 2017-06-03 NOTE — Assessment & Plan Note (Signed)
Continue vitamin D supplements.  Follow vitamin D level.   

## 2017-06-03 NOTE — Assessment & Plan Note (Signed)
She declines further treatment options.  Continue weight bearing exercise. Dietary calcium.

## 2017-06-03 NOTE — Assessment & Plan Note (Signed)
Blood pressure on recheck improved.  Blood pressures as outlined.  Same medication regimen.  Follow pressures.  Follow metabolic panel.

## 2017-06-03 NOTE — Assessment & Plan Note (Signed)
Stress is better.  Follow.   

## 2017-07-05 ENCOUNTER — Other Ambulatory Visit: Payer: Self-pay | Admitting: *Deleted

## 2017-07-05 MED ORDER — LISINOPRIL 20 MG PO TABS: ORAL_TABLET | ORAL | 1 refills | Status: DC

## 2017-07-10 ENCOUNTER — Ambulatory Visit
Admission: RE | Admit: 2017-07-10 | Discharge: 2017-07-10 | Disposition: A | Discharge status: Home / Self Care | Payer: PPO | Source: Ambulatory Visit | Attending: Internal Medicine | Admitting: Internal Medicine

## 2017-07-10 DIAGNOSIS — Z1231 Encounter for screening mammogram for malignant neoplasm of breast: Secondary | ICD-10-CM | POA: Diagnosis not present

## 2017-07-10 DIAGNOSIS — Z1239 Encounter for other screening for malignant neoplasm of breast: Secondary | ICD-10-CM

## 2017-07-12 DIAGNOSIS — H40153 Residual stage of open-angle glaucoma, bilateral: Secondary | ICD-10-CM | POA: Diagnosis not present

## 2017-07-27 ENCOUNTER — Other Ambulatory Visit (INDEPENDENT_AMBULATORY_CARE_PROVIDER_SITE_OTHER): Payer: PPO

## 2017-07-27 DIAGNOSIS — R7989 Other specified abnormal findings of blood chemistry: Secondary | ICD-10-CM | POA: Diagnosis not present

## 2017-07-27 LAB — TSH: TSH: 4.73 u[IU]/mL — ABNORMAL HIGH (ref 0.35–4.50)

## 2017-08-30 ENCOUNTER — Ambulatory Visit (INDEPENDENT_AMBULATORY_CARE_PROVIDER_SITE_OTHER): Payer: PPO | Admitting: Internal Medicine

## 2017-08-30 DIAGNOSIS — I10 Essential (primary) hypertension: Secondary | ICD-10-CM | POA: Diagnosis not present

## 2017-08-30 DIAGNOSIS — M810 Age-related osteoporosis without current pathological fracture: Secondary | ICD-10-CM | POA: Diagnosis not present

## 2017-08-30 DIAGNOSIS — D582 Other hemoglobinopathies: Secondary | ICD-10-CM | POA: Diagnosis not present

## 2017-08-30 DIAGNOSIS — F439 Reaction to severe stress, unspecified: Secondary | ICD-10-CM | POA: Diagnosis not present

## 2017-08-30 DIAGNOSIS — E559 Vitamin D deficiency, unspecified: Secondary | ICD-10-CM

## 2017-08-30 NOTE — Progress Notes (Signed)
Patient ID: Claire Clayton, female   DOB: August 29, 1931, 84 y.o.   MRN: [id number]   Subjective:    Patient ID: Claire Clayton, female    DOB: 06/16/1932, 84 y.o.   MRN: [id number]  HPI  Patient here for a scheduled follow up.  She reports she is doing well.  Stress is better.  Stays active.  No chest pain.  No sob.  No acid reflux.  No abdominal pain.  Bowels moving.  No urine change.  Outside blood pressure readings reviewed.  Blood pressures averaging 100-130s/60-80s.     Past Medical History:  Diagnosis Date  . Cancer (HCC)    squamous cell skin  . Diverticulosis   . Glaucoma   . Hypertension   . Osteoporosis   . Seasonal allergies    Past Surgical History:  Procedure Laterality Date  . BREAST EXCISIONAL BIOPSY Left 1970   benign  . CATARACT EXTRACTION, BILATERAL    . SKIN CANCER EXCISION     carcinoma (3 surgeries)  . TONSILLECTOMY  1948   Family History  Problem Relation Age of Onset  . Colon cancer Brother   . Colon polyps Sister   . Breast cancer Neg Hx    Social History   Socioeconomic History  . Marital status: Widowed    Spouse name: None  . Number of children: 3  . Years of education: None  . Highest education level: None  Social Needs  . Financial resource strain: None  . Food insecurity - worry: None  . Food insecurity - inability: None  . Transportation needs - medical: None  . Transportation needs - non-medical: None  Occupational History  . None  Tobacco Use  . Smoking status: Never Smoker  . Smokeless tobacco: Never Used  Substance and Sexual Activity  . Alcohol use: Yes    Alcohol/week: 0.0 oz    Comment: rare - glass of wine  . Drug use: No  . Sexual activity: No  Other Topics Concern  . None  Social History Narrative  . None    Outpatient Encounter Medications as of 08/30/2017  Medication Sig  . valACYclovir (VALTREX) 1000 MG tablet Take 2 tablets twice a day for 1 day with each outbreak  . aspirin EC 81 MG tablet Take 81 mg  by mouth daily.  Marland Kitchen atenolol (TENORMIN) 25 MG tablet Take 1 tablet (25 mg total) by mouth daily.  . busPIRone (BUSPAR) 5 MG tablet Take 1 tablet (5 mg total) by mouth daily as needed.  . Calcium Carbonate-Vitamin D 600-400 MG-UNIT per tablet Take 1 tablet by mouth daily.   . Fish Oil-Cholecalciferol (FISH OIL + D3 PO) Take by mouth daily.  . hydrocortisone (CORTEF) 10 MG tablet SWISH AND EXPECTORATE TWO TEASPOONFULS THREE TIMES A DAY.  Marland Kitchen lisinopril (PRINIVIL,ZESTRIL) 20 MG tablet Take 0.5 tablets (10 mg total) by mouth daily.  . Multiple Vitamins-Minerals (ICAPS PO) Take by mouth.  . timolol (BETIMOL) 0.5 % ophthalmic solution Place 1 drop into both eyes 2 (two) times daily.   No facility-administered encounter medications on file as of 08/30/2017.     Review of Systems  Constitutional: Negative for appetite change and unexpected weight change.  HENT: Negative for congestion and sinus pressure.   Respiratory: Negative for cough, chest tightness and shortness of breath.   Cardiovascular: Negative for chest pain, palpitations and leg swelling.  Gastrointestinal: Negative for abdominal pain, diarrhea, nausea and vomiting.  Genitourinary: Negative for difficulty urinating and dysuria.  Musculoskeletal: Negative  for joint swelling and myalgias.  Skin: Negative for color change and rash.  Neurological: Negative for dizziness, light-headedness and headaches.  Psychiatric/Behavioral: Negative for agitation and dysphoric mood.       Objective:     Blood pressure rechecked by me:  158-160/88  Physical Exam  Constitutional: She appears well-developed and well-nourished. No distress.  HENT:  Nose: Nose normal.  Mouth/Throat: Oropharynx is clear and moist.  Neck: Neck supple. No thyromegaly present.  Cardiovascular: Normal rate and regular rhythm.  Pulmonary/Chest: Breath sounds normal. No respiratory distress. She has no wheezes.  Abdominal: Soft. Bowel sounds are normal. There is no  tenderness.  Musculoskeletal: She exhibits no edema or tenderness.  Lymphadenopathy:    She has no cervical adenopathy.  Skin: No rash noted. No erythema.  Psychiatric: She has a normal mood and affect. Her behavior is normal.    BP (!) 158/88   Pulse 90   Temp (!) 97.5 F (36.4 C) (Oral)   Resp 18   Wt 118 lb 12.8 oz (53.9 kg)   SpO2 96%   BMI 21.04 kg/m  Wt Readings from Last 3 Encounters:  08/30/17 118 lb 12.8 oz (53.9 kg)  06/01/17 115 lb 12.8 oz (52.5 kg)  05/30/17 115 lb 12.8 oz (52.5 kg)     Lab Results  Component Value Date   WBC 7.2 11/21/2016   HGB 14.9 11/21/2016   HCT 43.6 11/21/2016   PLT 229.0 11/21/2016   GLUCOSE 96 05/30/2017   CHOL 179 05/30/2017   TRIG 81.0 05/30/2017   HDL 65.90 05/30/2017   LDLCALC 97 05/30/2017   ALT 18 05/30/2017   AST 20 05/30/2017   NA 137 05/30/2017   K 4.2 05/30/2017   CL 100 05/30/2017   CREATININE 0.80 05/30/2017   BUN 16 05/30/2017   CO2 31 05/30/2017   TSH 4.73 (H) 07/27/2017   HGBA1C 5.8 05/30/2017    Mm Digital Screening Bilateral  Result Date: 07/10/2017 CLINICAL DATA:  Screening. EXAM: DIGITAL SCREENING BILATERAL MAMMOGRAM WITH CAD COMPARISON:  Previous exam(s). ACR Breast Density Category c: The breast tissue is heterogeneously dense, which may obscure small masses. FINDINGS: There are no findings suspicious for malignancy. Images were processed with CAD. IMPRESSION: No mammographic evidence of malignancy. A result letter of this screening mammogram will be mailed directly to the patient. RECOMMENDATION: Screening mammogram in one year. (Code:SM-B-01Y) BI-RADS CATEGORY  1: Negative. Electronically Signed   By: Fidela Salisbury M.D.   On: 07/10/2017 14:50       Assessment & Plan:   Problem List Items Addressed This Visit    Elevated hemoglobin (HCC)    Last check wnl.  Follow.       Essential hypertension, benign    Blood pressure on outside checks under control.  Checks in the office elevated.  She  states she can tell her blood pressure goes up when comes into the office.  Desires no further medication.  Follow pressures.  Follow metabolic panel.        Osteoporosis    Has declined further medications.  Continue weight bearing exercise and dietary calcium.  Follow.       Stress    Stress is better.  Follow.        Vitamin D deficiency    Follow vitamin D level.            Einar Pheasant, MD

## 2017-08-31 DIAGNOSIS — Z85828 Personal history of other malignant neoplasm of skin: Secondary | ICD-10-CM | POA: Diagnosis not present

## 2017-08-31 DIAGNOSIS — B001 Herpesviral vesicular dermatitis: Secondary | ICD-10-CM | POA: Diagnosis not present

## 2017-08-31 DIAGNOSIS — D492 Neoplasm of unspecified behavior of bone, soft tissue, and skin: Secondary | ICD-10-CM | POA: Diagnosis not present

## 2017-08-31 DIAGNOSIS — L820 Inflamed seborrheic keratosis: Secondary | ICD-10-CM | POA: Diagnosis not present

## 2017-08-31 DIAGNOSIS — L570 Actinic keratosis: Secondary | ICD-10-CM | POA: Diagnosis not present

## 2017-09-02 ENCOUNTER — Encounter: Payer: Self-pay | Admitting: Internal Medicine

## 2017-09-02 DIAGNOSIS — D582 Other hemoglobinopathies: Secondary | ICD-10-CM | POA: Insufficient documentation

## 2017-09-02 NOTE — Assessment & Plan Note (Signed)
Has declined further medications.  Continue weight bearing exercise and dietary calcium.  Follow.

## 2017-09-02 NOTE — Assessment & Plan Note (Signed)
Stress is better.  Follow.   

## 2017-09-02 NOTE — Assessment & Plan Note (Signed)
Blood pressure on outside checks under control.  Checks in the office elevated.  She states she can tell her blood pressure goes up when comes into the office.  Desires no further medication.  Follow pressures.  Follow metabolic panel.

## 2017-09-02 NOTE — Assessment & Plan Note (Signed)
Last check wnl.  Follow.   

## 2017-09-02 NOTE — Assessment & Plan Note (Signed)
Follow vitamin D level.  

## 2017-10-15 ENCOUNTER — Other Ambulatory Visit: Payer: Self-pay | Admitting: Internal Medicine

## 2017-11-07 DIAGNOSIS — H40153 Residual stage of open-angle glaucoma, bilateral: Secondary | ICD-10-CM | POA: Diagnosis not present

## 2018-01-07 ENCOUNTER — Encounter: Payer: Self-pay | Admitting: Internal Medicine

## 2018-01-07 ENCOUNTER — Ambulatory Visit (INDEPENDENT_AMBULATORY_CARE_PROVIDER_SITE_OTHER): Payer: PPO | Admitting: Internal Medicine

## 2018-01-07 VITALS — BP 160/96 | HR 81 | Temp 97.6°F | Resp 18 | Wt 116.4 lb

## 2018-01-07 DIAGNOSIS — F439 Reaction to severe stress, unspecified: Secondary | ICD-10-CM

## 2018-01-07 DIAGNOSIS — D582 Other hemoglobinopathies: Secondary | ICD-10-CM | POA: Diagnosis not present

## 2018-01-07 DIAGNOSIS — M810 Age-related osteoporosis without current pathological fracture: Secondary | ICD-10-CM | POA: Diagnosis not present

## 2018-01-07 DIAGNOSIS — L989 Disorder of the skin and subcutaneous tissue, unspecified: Secondary | ICD-10-CM | POA: Diagnosis not present

## 2018-01-07 DIAGNOSIS — I10 Essential (primary) hypertension: Secondary | ICD-10-CM | POA: Diagnosis not present

## 2018-01-07 DIAGNOSIS — R739 Hyperglycemia, unspecified: Secondary | ICD-10-CM | POA: Diagnosis not present

## 2018-01-07 DIAGNOSIS — E559 Vitamin D deficiency, unspecified: Secondary | ICD-10-CM | POA: Diagnosis not present

## 2018-01-07 MED ORDER — MUPIROCIN 2 % EX OINT: TOPICAL_OINTMENT | CUTANEOUS | 0 refills | Status: DC

## 2018-01-07 MED ORDER — LISINOPRIL 20 MG PO TABS: ORAL_TABLET | ORAL | 1 refills | Status: DC

## 2018-01-07 NOTE — Assessment & Plan Note (Signed)
Recheck vitamin D level 

## 2018-01-07 NOTE — Assessment & Plan Note (Signed)
Continue weight beating exercise.  Check vitamin D level.  She declines medication.

## 2018-01-07 NOTE — Assessment & Plan Note (Signed)
Blood pressure on her outside checks - under better control.  Have her continue to spot check her pressure.  Continue same medication regimen.  Follow pressures.  Follow metabolic panel.

## 2018-01-07 NOTE — Progress Notes (Signed)
Patient ID: Claire Clayton, female   DOB: [birth year], 84 y.o.   MRN: [id number]   Subjective:    Patient ID: Claire Clayton, female    DOB: July 08, 1931, 84 y.o.   MRN: [id number]  HPI  Patient here for a scheduled follow up.  She reports she is doing relatively well.  Increased stress with her granddaughter's health and psychiatric issues.  Discussed with her today.  Overall she feels she is handling things relatively well.  Does not feel she needs anything more at this time.  Stays active.  No chest pain.  No sob.  No acid reflux.  No abdominal pain.  Has been having issues with her teeth.  Has affected her diet.  Bowels moving.  No urine change.  Did hit her right leg.  Persistent open lesion.  She is dressing it, etc.  Is some better.     Past Medical History:  Diagnosis Date  . Cancer (HCC)    squamous cell skin  . Diverticulosis   . Glaucoma   . Hypertension   . Osteoporosis   . Seasonal allergies    Past Surgical History:  Procedure Laterality Date  . BREAST EXCISIONAL BIOPSY Left 1970   benign  . CATARACT EXTRACTION, BILATERAL    . SKIN CANCER EXCISION     carcinoma (3 surgeries)  . TONSILLECTOMY  1948   Family History  Problem Relation Age of Onset  . Colon cancer Brother   . Colon polyps Sister   . Breast cancer Neg Hx    Social History   Socioeconomic History  . Marital status: Widowed    Spouse name: Not on file  . Number of children: 3  . Years of education: Not on file  . Highest education level: Not on file  Occupational History  . Not on file  Social Needs  . Financial resource strain: Not on file  . Food insecurity:    Worry: Not on file    Inability: Not on file  . Transportation needs:    Medical: Not on file    Non-medical: Not on file  Tobacco Use  . Smoking status: Never Smoker  . Smokeless tobacco: Never Used  Substance and Sexual Activity  . Alcohol use: Yes    Alcohol/week: 0.0 oz    Comment: rare - glass of wine  . Drug use: No   . Sexual activity: Never  Lifestyle  . Physical activity:    Days per week: Not on file    Minutes per session: Not on file  . Stress: Not on file  Relationships  . Social connections:    Talks on phone: Not on file    Gets together: Not on file    Attends religious service: Not on file    Active member of club or organization: Not on file    Attends meetings of clubs or organizations: Not on file    Relationship status: Not on file  Other Topics Concern  . Not on file  Social History Narrative  . Not on file    Outpatient Encounter Medications as of 01/07/2018  Medication Sig  . aspirin EC 81 MG tablet Take 81 mg by mouth daily.  Marland Kitchen atenolol (TENORMIN) 25 MG tablet Take 1 tablet (25 mg total) by mouth daily.  . busPIRone (BUSPAR) 5 MG tablet Take 1 tablet (5 mg total) by mouth daily as needed.  . Calcium Carbonate-Vitamin D 600-400 MG-UNIT per tablet Take 1 tablet by mouth daily.   Marland Kitchen  Fish Oil-Cholecalciferol (FISH OIL + D3 PO) Take by mouth daily.  . hydrocortisone (CORTEF) 10 MG tablet SWISH AND EXPECTORATE TWO TEASPOONFULS THREE TIMES A DAY.  Marland Kitchen lisinopril (PRINIVIL,ZESTRIL) 20 MG tablet Take 0.5 tablets (10 mg total) by mouth daily.  . Multiple Vitamins-Minerals (ICAPS PO) Take by mouth.  . mupirocin ointment (BACTROBAN) 2 % Apply to affected area bid  . timolol (BETIMOL) 0.5 % ophthalmic solution Place 1 drop into both eyes 2 (two) times daily.  . valACYclovir (VALTREX) 1000 MG tablet Take 2 tablets twice a day for 1 day with each outbreak  . [DISCONTINUED] lisinopril (PRINIVIL,ZESTRIL) 20 MG tablet Take 0.5 tablets (10 mg total) by mouth daily.   No facility-administered encounter medications on file as of 01/07/2018.     Review of Systems  Constitutional: Negative for appetite change and unexpected weight change.  HENT: Negative for congestion and sinus pressure.   Respiratory: Negative for cough, chest tightness and shortness of breath.   Cardiovascular: Negative for  chest pain, palpitations and leg swelling.  Gastrointestinal: Negative for abdominal pain, diarrhea, nausea and vomiting.  Genitourinary: Negative for difficulty urinating and dysuria.  Musculoskeletal: Negative for joint swelling and myalgias.  Skin: Negative for color change and rash.  Neurological: Negative for dizziness, light-headedness and headaches.  Psychiatric/Behavioral: Negative for agitation and dysphoric mood.       Objective:    Physical Exam  Constitutional: She appears well-developed and well-nourished. No distress.  HENT:  Nose: Nose normal.  Mouth/Throat: Oropharynx is clear and moist.  Neck: Neck supple. No thyromegaly present.  Cardiovascular: Normal rate and regular rhythm.  Pulmonary/Chest: Breath sounds normal. No respiratory distress. She has no wheezes.  Abdominal: Soft. Bowel sounds are normal. There is no tenderness.  Musculoskeletal: She exhibits no edema or tenderness.  Lymphadenopathy:    She has no cervical adenopathy.  Skin: No rash noted. No erythema.  Right lower leg - open lesion.   Psychiatric: She has a normal mood and affect. Her behavior is normal.    BP (!) 160/96 (BP Location: Left Arm, Patient Position: Sitting, Cuff Size: Normal)   Pulse 81   Temp 97.6 F (36.4 C) (Oral)   Resp 18   Wt 116 lb 6.4 oz (52.8 kg)   SpO2 95%   BMI 20.62 kg/m  Wt Readings from Last 3 Encounters:  01/07/18 116 lb 6.4 oz (52.8 kg)  08/30/17 118 lb 12.8 oz (53.9 kg)  06/01/17 115 lb 12.8 oz (52.5 kg)     Lab Results  Component Value Date   WBC 7.2 11/21/2016   HGB 14.9 11/21/2016   HCT 43.6 11/21/2016   PLT 229.0 11/21/2016   GLUCOSE 96 05/30/2017   CHOL 179 05/30/2017   TRIG 81.0 05/30/2017   HDL 65.90 05/30/2017   LDLCALC 97 05/30/2017   ALT 18 05/30/2017   AST 20 05/30/2017   NA 137 05/30/2017   K 4.2 05/30/2017   CL 100 05/30/2017   CREATININE 0.80 05/30/2017   BUN 16 05/30/2017   CO2 31 05/30/2017   TSH 4.73 (H) 07/27/2017    HGBA1C 5.8 05/30/2017    Mm Digital Screening Bilateral  Result Date: 07/10/2017 CLINICAL DATA:  Screening. EXAM: DIGITAL SCREENING BILATERAL MAMMOGRAM WITH CAD COMPARISON:  Previous exam(s). ACR Breast Density Category c: The breast tissue is heterogeneously dense, which may obscure small masses. FINDINGS: There are no findings suspicious for malignancy. Images were processed with CAD. IMPRESSION: No mammographic evidence of malignancy. A result letter of this screening  mammogram will be mailed directly to the patient. RECOMMENDATION: Screening mammogram in one year. (Code:SM-B-01Y) BI-RADS CATEGORY  1: Negative. Electronically Signed   By: Fidela Salisbury M.D.   On: 07/10/2017 14:50       Assessment & Plan:   Problem List Items Addressed This Visit    Elevated hemoglobin ([place])    Follow cbc.        Essential hypertension, benign - Primary    Blood pressure on her outside checks - under better control.  Have her continue to spot check her pressure.  Continue same medication regimen.  Follow pressures.  Follow metabolic panel.        Relevant Medications   lisinopril (PRINIVIL,ZESTRIL) 20 MG tablet   Other Relevant Orders   TSH   Hepatic function panel   Basic metabolic panel   CBC with Differential/Platelet   Osteoporosis    Continue weight beating exercise.  Check vitamin D level.  She declines medication.        Stress    Increased stress as outlined.  Discussed with her today.  Overall she feels she is handling things relatively well.  Does not feel needs any further intervention.  Follow.        Vitamin D deficiency    Recheck vitamin D level.        Relevant Orders   VITAMIN D 25 Hydroxy (Vit-D Deficiency, Fractures)    Other Visit Diagnoses    Hyperglycemia       Relevant Orders   Hemoglobin A1c   Skin lesion of right leg       Lesion - right leg.  Bactroban as directed.  Leave open.  Call with update.         Einar Pheasant, MD

## 2018-01-07 NOTE — Assessment & Plan Note (Signed)
Follow cbc.  

## 2018-01-07 NOTE — Assessment & Plan Note (Signed)
Increased stress as outlined.  Discussed with her today.  Overall she feels she is handling things relatively well.  Does not feel needs any further intervention.  Follow.   

## 2018-01-08 LAB — CBC WITH DIFFERENTIAL/PLATELET
BASOS ABS: 0.1 10*3/uL (ref 0.0–0.1)
Basophils Relative: 1.5 % (ref 0.0–3.0)
EOS PCT: 3 % (ref 0.0–5.0)
Eosinophils Absolute: 0.2 10*3/uL (ref 0.0–0.7)
HCT: 41.5 % (ref 36.0–46.0)
HEMOGLOBIN: 14.2 g/dL (ref 12.0–15.0)
Lymphocytes Relative: 33.7 % (ref 12.0–46.0)
Lymphs Abs: 2.3 10*3/uL (ref 0.7–4.0)
MCHC: 34.2 g/dL (ref 30.0–36.0)
MCV: 93.6 fl (ref 78.0–100.0)
MONO ABS: 0.6 10*3/uL (ref 0.1–1.0)
Monocytes Relative: 8.6 % (ref 3.0–12.0)
Neutro Abs: 3.7 10*3/uL (ref 1.4–7.7)
Neutrophils Relative %: 53.2 % (ref 43.0–77.0)
Platelets: 224 10*3/uL (ref 150.0–400.0)
RBC: 4.44 Mil/uL (ref 3.87–5.11)
RDW: 13.4 % (ref 11.5–15.5)
WBC: 6.9 10*3/uL (ref 4.0–10.5)

## 2018-01-08 LAB — HEPATIC FUNCTION PANEL
ALK PHOS: 79 U/L (ref 39–117)
ALT: 16 U/L (ref 0–35)
AST: 18 U/L (ref 0–37)
Albumin: 3.9 g/dL (ref 3.5–5.2)
Bilirubin, Direct: 0 mg/dL (ref 0.0–0.3)
TOTAL PROTEIN: 6.8 g/dL (ref 6.0–8.3)
Total Bilirubin: 0.3 mg/dL (ref 0.2–1.2)

## 2018-01-08 LAB — BASIC METABOLIC PANEL
BUN: 17 mg/dL (ref 6–23)
CO2: 29 mEq/L (ref 19–32)
Calcium: 9.6 mg/dL (ref 8.4–10.5)
Chloride: 103 mEq/L (ref 96–112)
Creatinine, Ser: 0.83 mg/dL (ref 0.40–1.20)
GFR: 69.62 mL/min (ref >60.00)
Glucose, Bld: 102 mg/dL — ABNORMAL HIGH (ref 70–99)
POTASSIUM: 4.8 meq/L (ref 3.5–5.1)
SODIUM: 139 meq/L (ref 135–145)

## 2018-01-08 LAB — VITAMIN D 25 HYDROXY (VIT D DEFICIENCY, FRACTURES): VITD: 29.42 ng/mL — AB (ref 30.00–100.00)

## 2018-01-08 LAB — HEMOGLOBIN A1C: Hgb A1c MFr Bld: 5.8 % (ref 4.6–6.5)

## 2018-01-08 LAB — TSH: TSH: 4.33 u[IU]/mL (ref 0.35–4.50)

## 2018-01-17 ENCOUNTER — Encounter: Payer: Self-pay | Admitting: *Deleted

## 2018-01-21 ENCOUNTER — Telehealth: Payer: Self-pay | Admitting: Internal Medicine

## 2018-01-21 NOTE — Telephone Encounter (Signed)
Claire Clayton would like for her lab results to be emailed or mailed to her.  [email or web address]  Or [place] [phone]

## 2018-01-22 NOTE — Telephone Encounter (Signed)
Labs have mailed to the address given below

## 2018-03-19 DIAGNOSIS — H40153 Residual stage of open-angle glaucoma, bilateral: Secondary | ICD-10-CM | POA: Diagnosis not present

## 2018-05-31 ENCOUNTER — Ambulatory Visit: Payer: PPO

## 2018-06-05 ENCOUNTER — Ambulatory Visit (INDEPENDENT_AMBULATORY_CARE_PROVIDER_SITE_OTHER): Payer: PPO | Admitting: Internal Medicine

## 2018-06-05 ENCOUNTER — Encounter: Payer: Self-pay | Admitting: Internal Medicine

## 2018-06-05 VITALS — BP 158/82 | HR 84 | Temp 97.2°F | Resp 18 | Ht 63.0 in | Wt 117.0 lb

## 2018-06-05 DIAGNOSIS — E559 Vitamin D deficiency, unspecified: Secondary | ICD-10-CM | POA: Diagnosis not present

## 2018-06-05 DIAGNOSIS — M810 Age-related osteoporosis without current pathological fracture: Secondary | ICD-10-CM

## 2018-06-05 DIAGNOSIS — Z1231 Encounter for screening mammogram for malignant neoplasm of breast: Secondary | ICD-10-CM | POA: Diagnosis not present

## 2018-06-05 DIAGNOSIS — Z23 Encounter for immunization: Secondary | ICD-10-CM | POA: Diagnosis not present

## 2018-06-05 DIAGNOSIS — Z0000 Encounter for general adult medical examination without abnormal findings: Secondary | ICD-10-CM | POA: Diagnosis not present

## 2018-06-05 DIAGNOSIS — I10 Essential (primary) hypertension: Secondary | ICD-10-CM | POA: Diagnosis not present

## 2018-06-05 DIAGNOSIS — E2839 Other primary ovarian failure: Secondary | ICD-10-CM

## 2018-06-05 DIAGNOSIS — R739 Hyperglycemia, unspecified: Secondary | ICD-10-CM

## 2018-06-05 DIAGNOSIS — R238 Other skin changes: Secondary | ICD-10-CM

## 2018-06-05 DIAGNOSIS — R233 Spontaneous ecchymoses: Secondary | ICD-10-CM | POA: Insufficient documentation

## 2018-06-05 LAB — CBC WITH DIFFERENTIAL/PLATELET
BASOS PCT: 0.6 % (ref 0.0–3.0)
Basophils Absolute: 0 10*3/uL (ref 0.0–0.1)
Eosinophils Absolute: 0.1 10*3/uL (ref 0.0–0.7)
Eosinophils Relative: 2.3 % (ref 0.0–5.0)
HCT: 42.1 % (ref 36.0–46.0)
Hemoglobin: 14.8 g/dL (ref 12.0–15.0)
LYMPHS ABS: 1.7 10*3/uL (ref 0.7–4.0)
Lymphocytes Relative: 27.2 % (ref 12.0–46.0)
MCHC: 35 g/dL (ref 30.0–36.0)
MCV: 92.9 fl (ref 78.0–100.0)
MONO ABS: 0.5 10*3/uL (ref 0.1–1.0)
Monocytes Relative: 8.7 % (ref 3.0–12.0)
NEUTROS ABS: 3.8 10*3/uL (ref 1.4–7.7)
NEUTROS PCT: 61.2 % (ref 43.0–77.0)
PLATELETS: 227 10*3/uL (ref 150.0–400.0)
RBC: 4.54 Mil/uL (ref 3.87–5.11)
RDW: 13.4 % (ref 11.5–15.5)
WBC: 6.2 10*3/uL (ref 4.0–10.5)

## 2018-06-05 LAB — LIPID PANEL
CHOLESTEROL: 180 mg/dL (ref 0–200)
HDL: 64.1 mg/dL (ref >39.00)
LDL CALC: 100 mg/dL — AB (ref 0–99)
NonHDL: 115.7
TRIGLYCERIDES: 79 mg/dL (ref 0.0–149.0)
Total CHOL/HDL Ratio: 3
VLDL: 15.8 mg/dL (ref 0.0–40.0)

## 2018-06-05 LAB — HEMOGLOBIN A1C: HEMOGLOBIN A1C: 5.6 % (ref 4.6–6.5)

## 2018-06-05 LAB — BASIC METABOLIC PANEL
BUN: 16 mg/dL (ref 6–23)
CHLORIDE: 103 meq/L (ref 96–112)
CO2: 29 meq/L (ref 19–32)
Calcium: 9.3 mg/dL (ref 8.4–10.5)
Creatinine, Ser: 0.81 mg/dL (ref 0.40–1.20)
GFR: 71.53 mL/min (ref >60.00)
Glucose, Bld: 113 mg/dL — ABNORMAL HIGH (ref 70–99)
Potassium: 4.5 mEq/L (ref 3.5–5.1)
Sodium: 138 mEq/L (ref 135–145)

## 2018-06-05 LAB — HEPATIC FUNCTION PANEL
ALT: 15 U/L (ref 0–35)
AST: 19 U/L (ref 0–37)
Albumin: 4.1 g/dL (ref 3.5–5.2)
Alkaline Phosphatase: 82 U/L (ref 39–117)
BILIRUBIN TOTAL: 0.5 mg/dL (ref 0.2–1.2)
Bilirubin, Direct: 0.1 mg/dL (ref 0.0–0.3)
Total Protein: 6.9 g/dL (ref 6.0–8.3)

## 2018-06-05 LAB — PROTIME-INR
INR: 0.9 ratio (ref 0.8–1.0)
Prothrombin Time: 10.7 s (ref 9.6–13.1)

## 2018-06-05 NOTE — Assessment & Plan Note (Signed)
Physical today 06/05/18.  Colonoscopy 06/09/09 - diverticulosis with internal hemorrhoids.  mammmogram 07/10/17 - [facility] I.

## 2018-06-05 NOTE — Progress Notes (Signed)
Patient ID: Claire Clayton, female   DOB: 1931/10/31, 84 y.o.   MRN: [id number]   Subjective:    Patient ID: Claire Clayton, female    DOB: 1931-12-21, 84 y.o.   MRN: [id number]  HPI  Patient here for her physical exam.  She reports she is doing well.  Feels she is handling stress well.  Discussed with her today.  Stays active.  No chest pain.  No sob.  No acid reflux.  No abdominal pain.  Bowels moving.  Has lost some weight.  Has dental implants and has been having problems with her teeth.  Has had to adjust her diet.  She is concerned about some increased bruising.  Outside blood pressure readings averaging mostly 120-130s/70-80s.     Past Medical History:  Diagnosis Date  . Cancer (HCC)    squamous cell skin  . Diverticulosis   . Glaucoma   . Hypertension   . Osteoporosis   . Seasonal allergies    Past Surgical History:  Procedure Laterality Date  . BREAST EXCISIONAL BIOPSY Left 1970   benign  . CATARACT EXTRACTION, BILATERAL    . SKIN CANCER EXCISION     carcinoma (3 surgeries)  . TONSILLECTOMY  1948   Family History  Problem Relation Age of Onset  . Colon cancer Brother   . Colon polyps Sister   . Breast cancer Neg Hx    Social History   Socioeconomic History  . Marital status: Widowed    Spouse name: Not on file  . Number of children: 3  . Years of education: Not on file  . Highest education level: Not on file  Occupational History  . Not on file  Social Needs  . Financial resource strain: Not on file  . Food insecurity:    Worry: Not on file    Inability: Not on file  . Transportation needs:    Medical: Not on file    Non-medical: Not on file  Tobacco Use  . Smoking status: Never Smoker  . Smokeless tobacco: Never Used  Substance and Sexual Activity  . Alcohol use: Yes    Alcohol/week: 0.0 standard drinks    Comment: rare - glass of wine  . Drug use: No  . Sexual activity: Never  Lifestyle  . Physical activity:    Days per week: Not on  file    Minutes per session: Not on file  . Stress: Not on file  Relationships  . Social connections:    Talks on phone: Not on file    Gets together: Not on file    Attends religious service: Not on file    Active member of club or organization: Not on file    Attends meetings of clubs or organizations: Not on file    Relationship status: Not on file  Other Topics Concern  . Not on file  Social History Narrative  . Not on file    Outpatient Encounter Medications as of 06/05/2018  Medication Sig  . aspirin EC 81 MG tablet Take 81 mg by mouth daily.  Marland Kitchen atenolol (TENORMIN) 25 MG tablet Take 1 tablet (25 mg total) by mouth daily.  . busPIRone (BUSPAR) 5 MG tablet Take 1 tablet (5 mg total) by mouth daily as needed.  . Calcium Carbonate-Vitamin D 600-400 MG-UNIT per tablet Take 1 tablet by mouth daily.   . hydrocortisone (CORTEF) 10 MG tablet SWISH AND EXPECTORATE TWO TEASPOONFULS THREE TIMES A DAY.  Marland Kitchen lisinopril (PRINIVIL,ZESTRIL) 20 MG  tablet Take 0.5 tablets (10 mg total) by mouth daily.  . Multiple Vitamins-Minerals (ICAPS PO) Take by mouth.  . mupirocin ointment (BACTROBAN) 2 % Apply to affected area bid  . timolol (BETIMOL) 0.5 % ophthalmic solution Place 1 drop into both eyes 2 (two) times daily.  . valACYclovir (VALTREX) 1000 MG tablet Take 2 tablets twice a day for 1 day with each outbreak  . [DISCONTINUED] Fish Oil-Cholecalciferol (FISH OIL + D3 PO) Take by mouth daily.   No facility-administered encounter medications on file as of 06/05/2018.     Review of Systems  Constitutional: Negative for appetite change and unexpected weight change.  HENT: Negative for congestion and sinus pressure.   Respiratory: Negative for cough, chest tightness and shortness of breath.   Cardiovascular: Negative for chest pain, palpitations and leg swelling.  Gastrointestinal: Negative for abdominal pain, diarrhea, nausea and vomiting.  Genitourinary: Negative for difficulty urinating and  dysuria.  Musculoskeletal: Negative for joint swelling and myalgias.  Skin: Negative for color change and rash.  Neurological: Negative for dizziness, light-headedness and headaches.  Psychiatric/Behavioral: Negative for agitation and dysphoric mood.       Objective:    Physical Exam Constitutional:      General: She is not in acute distress.    Appearance: Normal appearance. She is well-developed.  HENT:     Nose: Nose normal. No congestion.     Mouth/Throat:     Pharynx: No oropharyngeal exudate or posterior oropharyngeal erythema.  Eyes:     General: No scleral icterus.       Right eye: No discharge.        Left eye: No discharge.  Neck:     Musculoskeletal: Neck supple. No muscular tenderness.     Thyroid: No thyromegaly.  Cardiovascular:     Rate and Rhythm: Normal rate and regular rhythm.  Pulmonary:     Effort: No tachypnea, accessory muscle usage or respiratory distress.     Breath sounds: Normal breath sounds. No decreased breath sounds or wheezing.  Chest:     Breasts:        Right: No inverted nipple, mass, nipple discharge or tenderness (no axillary adenopathy).        Left: No inverted nipple, mass, nipple discharge or tenderness (no axilarry adenopathy).  Abdominal:     General: Bowel sounds are normal.     Palpations: Abdomen is soft.     Tenderness: There is no abdominal tenderness.  Musculoskeletal:        General: No swelling or tenderness.  Lymphadenopathy:     Cervical: No cervical adenopathy.  Skin:    Findings: No erythema or rash.  Neurological:     Mental Status: She is alert and oriented to person, place, and time.  Psychiatric:        Mood and Affect: Mood normal.        Behavior: Behavior normal.     BP (!) 158/82   Pulse 84   Temp (!) 97.2 F (36.2 C) (Oral)   Resp 18   Ht 5\' 3"  (1.6 m)   Wt 117 lb (53.1 kg)   SpO2 96%   BMI 20.73 kg/m  Wt Readings from Last 3 Encounters:  06/05/18 117 lb (53.1 kg)  01/07/18 116 lb 6.4 oz  (52.8 kg)  08/30/17 118 lb 12.8 oz (53.9 kg)     Lab Results  Component Value Date   WBC 6.2 06/05/2018   HGB 14.8 06/05/2018   HCT 42.1 06/05/2018  PLT 227.0 06/05/2018   GLUCOSE 113 (H) 06/05/2018   CHOL 180 06/05/2018   TRIG 79.0 06/05/2018   HDL 64.10 06/05/2018   LDLCALC 100 (H) 06/05/2018   ALT 15 06/05/2018   AST 19 06/05/2018   NA 138 06/05/2018   K 4.5 06/05/2018   CL 103 06/05/2018   CREATININE 0.81 06/05/2018   BUN 16 06/05/2018   CO2 29 06/05/2018   TSH 4.33 01/07/2018   INR 0.9 06/05/2018   HGBA1C 5.6 06/05/2018    Mm Digital Screening Bilateral  Result Date: 07/10/2017 CLINICAL DATA:  Screening. EXAM: DIGITAL SCREENING BILATERAL MAMMOGRAM WITH CAD COMPARISON:  Previous exam(s). ACR Breast Density Category c: The breast tissue is heterogeneously dense, which may obscure small masses. FINDINGS: There are no findings suspicious for malignancy. Images were processed with CAD. IMPRESSION: No mammographic evidence of malignancy. A result letter of this screening mammogram will be mailed directly to the patient. RECOMMENDATION: Screening mammogram in one year. (Code:SM-B-01Y) BI-RADS CATEGORY  1: Negative. Electronically Signed   By: Fidela Salisbury M.D.   On: 07/10/2017 14:50       Assessment & Plan:   Problem List Items Addressed This Visit    Easy bruising    Easy bruising.  Check cbc and pt/inr.        Relevant Orders   CBC with Differential/Platelet (Completed)   Protime-INR (Completed)   Essential hypertension, benign    Blood pressures on outside checks under reasonable control.  Discussed increasing lisinopril.  She is only taking 1/2 lisinopril 20mg  q day.  Agreed to increase to 20mg  q day.  Follow pressures.  Follow metabolic panel.        Relevant Orders   Hepatic function panel (Completed)   Lipid panel (Completed)   Basic metabolic panel (Completed)   Health care maintenance    Physical today 06/05/18.  Colonoscopy 06/09/09 -  diverticulosis with internal hemorrhoids.  mammmogram 07/10/17 - [facility] I.        Osteoporosis    Declines medication.  conitnue weight beating exercise.        Vitamin D deficiency    Follow vitamin D level.         Other Visit Diagnoses    Visit for screening mammogram    -  Primary   Relevant Orders   MM 3D SCREEN BREAST BILATERAL   Estrogen deficiency       Relevant Orders   DG Bone Density   Hyperglycemia       Relevant Orders   Hemoglobin A1c (Completed)   Need for 23-polyvalent pneumococcal polysaccharide vaccine       Relevant Orders   Pneumococcal polysaccharide vaccine 23-valent greater than or equal to 2yo subcutaneous/IM (Completed)       Einar Pheasant, MD

## 2018-06-09 ENCOUNTER — Encounter: Payer: Self-pay | Admitting: Internal Medicine

## 2018-06-09 NOTE — Assessment & Plan Note (Signed)
Easy bruising.  Check cbc and pt/inr.

## 2018-06-09 NOTE — Assessment & Plan Note (Signed)
Declines medication.  conitnue weight beating exercise.

## 2018-06-09 NOTE — Assessment & Plan Note (Signed)
Blood pressures on outside checks under reasonable control.  Discussed increasing lisinopril.  She is only taking 1/2 lisinopril 20mg  q day.  Agreed to increase to 20mg  q day.  Follow pressures.  Follow metabolic panel.

## 2018-06-09 NOTE — Assessment & Plan Note (Signed)
Follow vitamin D level.  

## 2018-07-24 ENCOUNTER — Other Ambulatory Visit: Payer: PPO

## 2018-07-30 DIAGNOSIS — H40153 Residual stage of open-angle glaucoma, bilateral: Secondary | ICD-10-CM | POA: Diagnosis not present

## 2018-07-31 ENCOUNTER — Other Ambulatory Visit: Payer: Self-pay | Admitting: Internal Medicine

## 2018-08-12 ENCOUNTER — Other Ambulatory Visit: Payer: Self-pay | Admitting: Pharmacist

## 2018-08-12 NOTE — Patient Outreach (Signed)
[facility]) Care Management  [place] - Medication Adherence   08/12/2018  Claire Clayton [birth year] [id number]  [facility] Medication: lisinopril 20 mg Current insurance:[employer]   Outreach:  Incoming call from [facility] in response to the [facility] Medication Adherence Campaign. Speak with patient. HIPAA identifiers verified.  Subjective:  Claire Clayton reports that she has been taking her lisinopril 20 mg now once daily as directed. Per patient and medication list in chart, patient has been taking 1/2 tablet (10 mg) daily. Reports that at her last visit, her PCP instruct that she try taking a full tablet once daily until her next appointment. Per note in chart from 06/05/18 PCP stated regarding her lisinopril that patient "agreed to increase to 20mg  q day." Claire Clayton reports that she just had the prescription refilled and currently has enough to last her until her next PCP appointment in March.  Counsel patient about the importance of medication adherence. Also counsel about the importance of having her prescription dosing be up to date with her pharmacy.    Objective: Lab Results  Component Value Date   CREATININE 0.81 06/05/2018   CREATININE 0.83 01/07/2018   CREATININE 0.80 05/30/2017    Lab Results  Component Value Date   HGBA1C 5.6 06/05/2018    Lipid Panel     Component Value Date/Time   CHOL 180 06/05/2018 0943   TRIG 79.0 06/05/2018 0943   HDL 64.10 06/05/2018 0943   CHOLHDL 3 06/05/2018 0943   VLDL 15.8 06/05/2018 0943   LDLCALC 100 (H) 06/05/2018 0943    BP Readings from Last 3 Encounters:  06/09/18 (!) 158/82  01/07/18 (!) 160/96  09/02/17 (!) 158/88    Allergies  Allergen Reactions  . Scopolamine Palpitations  . Penicillins Itching  . Sulfa Antibiotics Itching     Assessment:  . Need to update dosing of lisinopril with patient's pharmacy   Plan:  1) Will send an InBasket message to  patient's PCP requesting that an updated lisinopril prescription be sent to the patient's pharmacy reflecting her current lisinopril dosing, lisinopril 20 mg daily.  2) Will close pharmacy episode at this time.  Harlow Asa, PharmD, [place] [facility] [phone]

## 2018-08-13 ENCOUNTER — Telehealth: Payer: Self-pay | Admitting: Internal Medicine

## 2018-08-13 ENCOUNTER — Other Ambulatory Visit: Payer: Self-pay

## 2018-08-13 MED ORDER — LISINOPRIL 20 MG PO TABS: 20.0000 mg | ORAL_TABLET | Freq: Every day | ORAL | 1 refills | Status: DC

## 2018-08-13 NOTE — Telephone Encounter (Signed)
-----   Message from Lars Masson, LPN sent at [phone]  3:38 PM EST ----- Regarding: RE: Lisinopril Prescription Pt is taking 20 mg q day and tolerating well. Changed in chart ----- Message ----- From: Einar Pheasant, MD Sent: 08/13/2018   5:57 AM EST To: Lars Masson, LPN Subject: FW: Lisinopril Prescription                    Please clarify with pt what dose of lisinopril she is taking and update chart and pharmacy rx.  Please send message back to me once updated.  Thanks    Dr Nicki Reaper ----- Message ----- From: Vella Raring, [facility] Sent: 08/12/2018  11:15 AM EST To: Einar Pheasant, MD Subject: Lisinopril Prescription                        Dr. Nicki Reaper,  I had the opportunity to speak with Claire Clayton this morning regarding her medication adherence to her lisinopril. Per patient and chart note from 06/05/18, she is currently taking lisinopril 20 mg - 1 full tablet once daily. Note that patient's chart and current prescription at her pharmacy reflect the  tablet (10 mg) dosing.   If patient is to continue the lisinopril 20 mg once daily dosing at this time, would you please send an updated prescription to her pharmacy?  Thank you!  Harlow Asa, PharmD, [place] [facility] [phone]

## 2018-08-15 ENCOUNTER — Other Ambulatory Visit: Payer: Self-pay | Admitting: Internal Medicine

## 2018-08-22 ENCOUNTER — Ambulatory Visit
Admission: RE | Admit: 2018-08-22 | Discharge: 2018-08-22 | Disposition: A | Discharge status: Home / Self Care | Payer: PPO | Source: Ambulatory Visit | Attending: Internal Medicine | Admitting: Internal Medicine

## 2018-08-22 DIAGNOSIS — E2839 Other primary ovarian failure: Secondary | ICD-10-CM | POA: Diagnosis not present

## 2018-08-22 DIAGNOSIS — Z1231 Encounter for screening mammogram for malignant neoplasm of breast: Secondary | ICD-10-CM

## 2018-08-22 DIAGNOSIS — M810 Age-related osteoporosis without current pathological fracture: Secondary | ICD-10-CM | POA: Diagnosis not present

## 2018-09-13 ENCOUNTER — Ambulatory Visit: Payer: PPO | Admitting: Internal Medicine

## 2018-09-22 ENCOUNTER — Other Ambulatory Visit: Payer: Self-pay | Admitting: Internal Medicine

## 2018-11-06 ENCOUNTER — Encounter: Payer: Self-pay | Admitting: *Deleted

## 2018-11-20 ENCOUNTER — Other Ambulatory Visit: Payer: Self-pay

## 2018-11-20 ENCOUNTER — Encounter: Payer: Self-pay | Admitting: Internal Medicine

## 2018-11-20 ENCOUNTER — Ambulatory Visit (INDEPENDENT_AMBULATORY_CARE_PROVIDER_SITE_OTHER): Payer: PPO | Admitting: Internal Medicine

## 2018-11-20 DIAGNOSIS — D582 Other hemoglobinopathies: Secondary | ICD-10-CM

## 2018-11-20 DIAGNOSIS — F439 Reaction to severe stress, unspecified: Secondary | ICD-10-CM

## 2018-11-20 DIAGNOSIS — E559 Vitamin D deficiency, unspecified: Secondary | ICD-10-CM

## 2018-11-20 DIAGNOSIS — I10 Essential (primary) hypertension: Secondary | ICD-10-CM

## 2018-11-20 MED ORDER — LISINOPRIL 20 MG PO TABS: 20.0000 mg | ORAL_TABLET | Freq: Every day | ORAL | 1 refills | Status: DC

## 2018-11-20 NOTE — Progress Notes (Signed)
Patient ID: Claire Clayton, female   DOB: 02/14/1932, 85 y.o.   MRN: [id number]   Virtual Visit via video Note  This visit type was conducted due to national recommendations for restrictions regarding the COVID-19 pandemic (e.g. social distancing).  This format is felt to be most appropriate for this patient at this time.  All issues noted in this document were discussed and addressed.  No physical exam was performed (except for noted visual exam findings with Video Visits).   I connected with Anette Barra today by a video enabled telemedicine application and verified that I am speaking with the correct person using two identifiers. Location patient: home Location provider: work Persons participating in the virtual visit: patient, provider  I discussed the limitations, risks, security and privacy concerns of performing an evaluation and management service by video and the availability of in person appointments. The patient expressed understanding and agreed to proceed.   Reason for visit: scheduled follow up.   HPI: She reports she is doing relatively well.  Still with increased stress with her granddaughter's health issues.  Discussed with her today.  Overall she feels she is handling things relatively well.  Does not feel needs any further intervention.  She is eating.  Good appetite.  Weight is stable.  States her weight today is 117.4 pounds.  Has the dental implants.  Eating better.  Taking lisinopril 20mg  q day now.  Feels blood pressure is doing well.  States had episode of diarrhea.  Had started some otc supplements.  Her daughter also had GI issues.  Has resolved now.  No abdominal pain or bowel change now.  Overall feels good.  Has f/u with [facility] dermatology 11/28/18 - for f/u skin cancer.     ROS: See pertinent positives and negatives per HPI.  Past Medical History:  Diagnosis Date  . Cancer (HCC)    squamous cell skin  . Diverticulosis   . Glaucoma   . Hypertension   .  Osteoporosis   . Seasonal allergies     Past Surgical History:  Procedure Laterality Date  . BREAST EXCISIONAL BIOPSY Left 1970   benign  . CATARACT EXTRACTION, BILATERAL    . SKIN CANCER EXCISION     carcinoma (3 surgeries)  . TONSILLECTOMY  1948    Family History  Problem Relation Age of Onset  . Colon cancer Brother   . Colon polyps Sister   . Breast cancer Neg Hx     SOCIAL HX: reviewed.    Current Outpatient Medications:  .  aspirin EC 81 MG tablet, Take 81 mg by mouth daily., Disp: , Rfl:  .  atenolol (TENORMIN) 25 MG tablet, Take 1 tablet (25 mg total) by mouth daily., Disp: 90 tablet, Rfl: 0 .  busPIRone (BUSPAR) 5 MG tablet, Take 1 tablet (5 mg total) by mouth daily as needed., Disp: 30 tablet, Rfl: 0 .  Calcium Carbonate-Vitamin D 600-400 MG-UNIT per tablet, Take 1 tablet by mouth daily. , Disp: , Rfl:  .  hydrocortisone (CORTEF) 10 MG tablet, SWISH AND EXPECTORATE TWO TEASPOONFULS THREE TIMES A DAY., Disp: 240 tablet, Rfl: 0 .  lisinopril (ZESTRIL) 20 MG tablet, Take 1 tablet (20 mg total) by mouth daily., Disp: 90 tablet, Rfl: 1 .  Multiple Vitamins-Minerals (ICAPS PO), Take by mouth., Disp: , Rfl:  .  mupirocin ointment (BACTROBAN) 2 %, Apply to affected area twicd a day, Disp: 22 g, Rfl: 0 .  timolol (BETIMOL) 0.5 % ophthalmic solution, Place 1  drop into both eyes 2 (two) times daily., Disp: , Rfl:  .  valACYclovir (VALTREX) 1000 MG tablet, Take 2 tablets twice a day for 1 day with each outbreak, Disp: , Rfl:   EXAM:  GENERAL: alert, oriented, appears well and in no acute distress  HEENT: atraumatic, conjunttiva clear, no obvious abnormalities on inspection of external nose and ears  NECK: normal movements of the head and neck  LUNGS: on inspection no signs of respiratory distress, breathing rate appears normal, no obvious gross SOB, gasping or wheezing  CV: no obvious cyanosis  PSYCH/NEURO: pleasant and cooperative, no obvious depression or anxiety,  speech and thought processing grossly intact  ASSESSMENT AND PLAN:  Discussed the following assessment and plan:  Elevated hemoglobin (HCC)  Essential hypertension, benign  Stress  Vitamin D deficiency  Elevated hemoglobin (HCC) Follow cbc.   Essential hypertension, benign On lisinopril 20mg  q day now.  Continue current medication regimen.  Follow pressures.  Follow metabolic panel.    Stress Increased stress as outlined.  Discussed with her today.  She does not feel she needs any further intervention.  Follow.    Vitamin D deficiency Follow vitamin D level.      I discussed the assessment and treatment plan with the patient. The patient was provided an opportunity to ask questions and all were answered. The patient agreed with the plan and demonstrated an understanding of the instructions.   The patient was advised to call back or seek an in-person evaluation if the symptoms worsen or if the condition fails to improve as anticipated.   Einar Pheasant, MD

## 2018-11-24 ENCOUNTER — Encounter: Payer: Self-pay | Admitting: Internal Medicine

## 2018-11-24 NOTE — Assessment & Plan Note (Signed)
Follow cbc.  

## 2018-11-24 NOTE — Assessment & Plan Note (Signed)
Increased stress as outlined.  Discussed with her today.  She does not feel she needs any further intervention.  Follow.   

## 2018-11-24 NOTE — Assessment & Plan Note (Signed)
Follow vitamin D level.  

## 2018-11-24 NOTE — Assessment & Plan Note (Signed)
On lisinopril 20mg  q day now.  Continue current medication regimen.  Follow pressures.  Follow metabolic panel.

## 2018-11-26 ENCOUNTER — Telehealth: Payer: Self-pay

## 2018-11-26 NOTE — Telephone Encounter (Signed)
BP readings in folder for review.  °

## 2018-11-26 NOTE — Telephone Encounter (Signed)
Reviewed blood pressure readings.  Overall under reasonable control.  Continue current medication regimen.  Continue to spot check.

## 2018-11-27 NOTE — Telephone Encounter (Signed)
Left detailed message for pt 

## 2018-11-28 DIAGNOSIS — L814 Other melanin hyperpigmentation: Secondary | ICD-10-CM | POA: Diagnosis not present

## 2018-11-28 DIAGNOSIS — C44529 Squamous cell carcinoma of skin of other part of trunk: Secondary | ICD-10-CM | POA: Diagnosis not present

## 2018-11-28 DIAGNOSIS — C44729 Squamous cell carcinoma of skin of left lower limb, including hip: Secondary | ICD-10-CM | POA: Diagnosis not present

## 2018-11-28 DIAGNOSIS — L821 Other seborrheic keratosis: Secondary | ICD-10-CM | POA: Diagnosis not present

## 2018-11-28 DIAGNOSIS — Z85828 Personal history of other malignant neoplasm of skin: Secondary | ICD-10-CM | POA: Diagnosis not present

## 2018-11-28 DIAGNOSIS — D229 Melanocytic nevi, unspecified: Secondary | ICD-10-CM | POA: Diagnosis not present

## 2018-11-28 DIAGNOSIS — L858 Other specified epidermal thickening: Secondary | ICD-10-CM | POA: Diagnosis not present

## 2018-11-28 DIAGNOSIS — D489 Neoplasm of uncertain behavior, unspecified: Secondary | ICD-10-CM | POA: Diagnosis not present

## 2019-01-22 DIAGNOSIS — H40153 Residual stage of open-angle glaucoma, bilateral: Secondary | ICD-10-CM | POA: Diagnosis not present

## 2019-02-19 DIAGNOSIS — C44319 Basal cell carcinoma of skin of other parts of face: Secondary | ICD-10-CM | POA: Diagnosis not present

## 2019-02-19 DIAGNOSIS — Z85828 Personal history of other malignant neoplasm of skin: Secondary | ICD-10-CM | POA: Diagnosis not present

## 2019-02-19 DIAGNOSIS — C44729 Squamous cell carcinoma of skin of left lower limb, including hip: Secondary | ICD-10-CM | POA: Diagnosis not present

## 2019-02-19 DIAGNOSIS — L814 Other melanin hyperpigmentation: Secondary | ICD-10-CM | POA: Diagnosis not present

## 2019-02-19 DIAGNOSIS — C44722 Squamous cell carcinoma of skin of right lower limb, including hip: Secondary | ICD-10-CM | POA: Diagnosis not present

## 2019-02-19 DIAGNOSIS — L570 Actinic keratosis: Secondary | ICD-10-CM | POA: Diagnosis not present

## 2019-02-19 DIAGNOSIS — D485 Neoplasm of uncertain behavior of skin: Secondary | ICD-10-CM | POA: Diagnosis not present

## 2019-02-19 DIAGNOSIS — L578 Other skin changes due to chronic exposure to nonionizing radiation: Secondary | ICD-10-CM | POA: Diagnosis not present

## 2019-03-03 ENCOUNTER — Encounter: Payer: Self-pay | Admitting: Internal Medicine

## 2019-03-03 ENCOUNTER — Other Ambulatory Visit: Payer: Self-pay

## 2019-03-03 ENCOUNTER — Ambulatory Visit (INDEPENDENT_AMBULATORY_CARE_PROVIDER_SITE_OTHER): Payer: PPO | Admitting: Internal Medicine

## 2019-03-03 VITALS — BP 126/70 | Ht 63.0 in | Wt 117.0 lb

## 2019-03-03 DIAGNOSIS — M810 Age-related osteoporosis without current pathological fracture: Secondary | ICD-10-CM

## 2019-03-03 DIAGNOSIS — R42 Dizziness and giddiness: Secondary | ICD-10-CM | POA: Diagnosis not present

## 2019-03-03 DIAGNOSIS — E559 Vitamin D deficiency, unspecified: Secondary | ICD-10-CM | POA: Diagnosis not present

## 2019-03-03 DIAGNOSIS — R739 Hyperglycemia, unspecified: Secondary | ICD-10-CM | POA: Diagnosis not present

## 2019-03-03 DIAGNOSIS — F439 Reaction to severe stress, unspecified: Secondary | ICD-10-CM | POA: Diagnosis not present

## 2019-03-03 DIAGNOSIS — D582 Other hemoglobinopathies: Secondary | ICD-10-CM

## 2019-03-03 DIAGNOSIS — I10 Essential (primary) hypertension: Secondary | ICD-10-CM | POA: Diagnosis not present

## 2019-03-03 NOTE — Progress Notes (Signed)
Patient ID: Claire Clayton, female   DOB: 09-10-1931, 85 y.o.   MRN: [id number]   Virtual Visit via video Note  This visit type was conducted due to national recommendations for restrictions regarding the COVID-19 pandemic (e.g. social distancing).  This format is felt to be most appropriate for this patient at this time.  All issues noted in this document were discussed and addressed.  No physical exam was performed (except for noted visual exam findings with Video Visits).   I connected with Jerelyn Josselin Gaulin by a video enabled telemedicine application and verified that I am speaking with the correct person using two identifiers. Location patient: home Location provider: work  Persons participating in the virtual visit: patient, provider  I discussed the limitations, risks, security and privacy concerns of performing an evaluation and management service by telephone and the availability of in person appointments.  The patient expressed understanding and agreed to proceed.   Reason for visit: scheduled follow up.   HPI: Reports she is seeing dermatology - f/u skin center.  Recently evaluated.  Apparently going to have Moh's surgery.  Staying active.  No chest pain.  No sob.  No acid reflux.  No abdominal pain.  Bowels moving.  Blood pressures vary.  States averages:  117-130s/70s.  Weight stable.  Eating well.  She did have an episode recently where she got up to go to the bathroom and noticed room spinning.  Had to lie down.  Resolved that day.  Has not had any recurrence.  No headache.  No light headedness.  Reports no other neurological changes.  Has been under increased stress.  She is still dealing with family stress with her granddaughter.  Also increased stress with some house issues.  Discussed with her today.  Overall she feels she is handling things relatively well.  Does not feel needs any further intervention.     ROS: See pertinent positives and negatives per HPI.  Past Medical  History:  Diagnosis Date  . Cancer (HCC)    squamous cell skin  . Diverticulosis   . Glaucoma   . Hypertension   . Osteoporosis   . Seasonal allergies     Past Surgical History:  Procedure Laterality Date  . BREAST EXCISIONAL BIOPSY Left 1970   benign  . CATARACT EXTRACTION, BILATERAL    . SKIN CANCER EXCISION     carcinoma (3 surgeries)  . TONSILLECTOMY  1948    Family History  Problem Relation Age of Onset  . Colon cancer Brother   . Colon polyps Sister   . Breast cancer Neg Hx     SOCIAL HX: reviewed.    Current Outpatient Medications:  .  aspirin EC 81 MG tablet, Take 81 mg by mouth daily., Disp: , Rfl:  .  atenolol (TENORMIN) 25 MG tablet, Take 1 tablet (25 mg total) by mouth daily., Disp: 90 tablet, Rfl: 0 .  busPIRone (BUSPAR) 5 MG tablet, Take 1 tablet (5 mg total) by mouth daily as needed., Disp: 30 tablet, Rfl: 0 .  Calcium Carbonate-Vitamin D 600-400 MG-UNIT per tablet, Take 1 tablet by mouth daily. , Disp: , Rfl:  .  hydrocortisone (CORTEF) 10 MG tablet, SWISH AND EXPECTORATE TWO TEASPOONFULS THREE TIMES A DAY., Disp: 240 tablet, Rfl: 0 .  lisinopril (ZESTRIL) 20 MG tablet, Take 1 tablet (20 mg total) by mouth daily., Disp: 90 tablet, Rfl: 1 .  Multiple Vitamins-Minerals (ICAPS PO), Take by mouth., Disp: , Rfl:  .  mupirocin ointment (BACTROBAN)  2 %, Apply to affected area twicd a day, Disp: 22 g, Rfl: 0 .  timolol (BETIMOL) 0.5 % ophthalmic solution, Place 1 drop into both eyes 2 (two) times daily., Disp: , Rfl:  .  valACYclovir (VALTREX) 1000 MG tablet, Take 2 tablets twice a day for 1 day with each outbreak, Disp: , Rfl:   EXAM:  VITALS per patient if applicable:  [id number], weight 116.4lbs  GENERAL: alert.  Sounds to be in no acute distress.  Answering questions appropriately.    PSYCH/NEURO: pleasant and cooperative, no obvious depression or anxiety, speech and thought processing grossly intact  ASSESSMENT AND PLAN:  Discussed the following  assessment and plan:  Elevated hemoglobin (HCC) hgb 06/05/18 - 14.8 (wnl). Follow.    Essential hypertension, benign Blood pressures as outlined.  Continue current medication regimen.  Follow pressures.  Follow metabolic panel.    Osteoporosis Has declined medication.  Continue weight bearing exercise.  Discuss calcium and vitamin D.   Stress Increased stress as outlined.  Discussed with her today.  She does not feel needs any further intervention.  Follow.    Vitamin D deficiency Follow vitamin D level.   Dizziness Dizziness as outlined.  Resolved.  No recurrence.  Unclear etiology.  Described room spinning - vertigo.  Discussed further evaluation and w/up.  She wants to monitor.  Will notify me if return of symptoms.  Did report some minimal sinus symptoms and congestion. Discussed saline nasal spray.  No significant symptoms.  Follow.     I discussed the assessment and treatment plan with the patient. The patient was provided an opportunity to ask questions and all were answered. The patient agreed with the plan and demonstrated an understanding of the instructions.   The patient was advised to call back or seek an in-person evaluation if the symptoms worsen or if the condition fails to improve as anticipated.  I provided 25 minutes of non-face-to-face time during this encounter.   Einar Pheasant, MD

## 2019-03-09 ENCOUNTER — Encounter: Payer: Self-pay | Admitting: Internal Medicine

## 2019-03-09 DIAGNOSIS — R42 Dizziness and giddiness: Secondary | ICD-10-CM | POA: Insufficient documentation

## 2019-03-09 NOTE — Assessment & Plan Note (Signed)
Follow vitamin D level.  

## 2019-03-09 NOTE — Assessment & Plan Note (Signed)
Increased stress as outlined.  Discussed with her today.  She does not feel needs any further intervention.  Follow.   

## 2019-03-09 NOTE — Assessment & Plan Note (Signed)
Blood pressures as outlined.  Continue current medication regimen.  Follow pressures.  Follow metabolic panel.   

## 2019-03-09 NOTE — Assessment & Plan Note (Signed)
hgb 06/05/18 - 14.8 (wnl). Follow.

## 2019-03-09 NOTE — Assessment & Plan Note (Signed)
Has declined medication.  Continue weight bearing exercise.  Discuss calcium and vitamin D.

## 2019-03-09 NOTE — Assessment & Plan Note (Addendum)
Dizziness as outlined.  Resolved.  No recurrence.  Unclear etiology.  Described room spinning - vertigo.  Discussed further evaluation and w/up.  She wants to monitor.  Will notify me if return of symptoms.  Did report some minimal sinus symptoms and congestion. Discussed saline nasal spray.  No significant symptoms.  Follow.

## 2019-03-25 DIAGNOSIS — C44729 Squamous cell carcinoma of skin of left lower limb, including hip: Secondary | ICD-10-CM | POA: Diagnosis not present

## 2019-03-25 DIAGNOSIS — C44319 Basal cell carcinoma of skin of other parts of face: Secondary | ICD-10-CM | POA: Diagnosis not present

## 2019-03-25 DIAGNOSIS — C44722 Squamous cell carcinoma of skin of right lower limb, including hip: Secondary | ICD-10-CM | POA: Diagnosis not present

## 2019-04-16 DIAGNOSIS — L578 Other skin changes due to chronic exposure to nonionizing radiation: Secondary | ICD-10-CM | POA: Diagnosis not present

## 2019-04-16 DIAGNOSIS — Z85828 Personal history of other malignant neoplasm of skin: Secondary | ICD-10-CM | POA: Diagnosis not present

## 2019-04-16 DIAGNOSIS — L821 Other seborrheic keratosis: Secondary | ICD-10-CM | POA: Diagnosis not present

## 2019-04-16 DIAGNOSIS — L570 Actinic keratosis: Secondary | ICD-10-CM | POA: Diagnosis not present

## 2019-04-16 DIAGNOSIS — L814 Other melanin hyperpigmentation: Secondary | ICD-10-CM | POA: Diagnosis not present

## 2019-04-25 ENCOUNTER — Other Ambulatory Visit: Payer: Self-pay

## 2019-04-29 ENCOUNTER — Other Ambulatory Visit: Payer: Self-pay

## 2019-04-29 ENCOUNTER — Other Ambulatory Visit (INDEPENDENT_AMBULATORY_CARE_PROVIDER_SITE_OTHER): Payer: PPO

## 2019-04-29 DIAGNOSIS — I10 Essential (primary) hypertension: Secondary | ICD-10-CM

## 2019-04-29 DIAGNOSIS — R739 Hyperglycemia, unspecified: Secondary | ICD-10-CM | POA: Diagnosis not present

## 2019-04-29 LAB — BASIC METABOLIC PANEL
BUN: 15 mg/dL (ref 6–23)
CO2: 29 mEq/L (ref 19–32)
Calcium: 9.4 mg/dL (ref 8.4–10.5)
Chloride: 101 mEq/L (ref 96–112)
Creatinine, Ser: 0.8 mg/dL (ref 0.40–1.20)
GFR: 68.13 mL/min (ref >60.00)
Glucose, Bld: 117 mg/dL — ABNORMAL HIGH (ref 70–99)
Potassium: 4.4 mEq/L (ref 3.5–5.1)
Sodium: 136 mEq/L (ref 135–145)

## 2019-04-29 LAB — HEPATIC FUNCTION PANEL
ALT: 14 U/L (ref 0–35)
AST: 19 U/L (ref 0–37)
Albumin: 4.3 g/dL (ref 3.5–5.2)
Alkaline Phosphatase: 88 U/L (ref 39–117)
Bilirubin, Direct: 0.1 mg/dL (ref 0.0–0.3)
Total Bilirubin: 0.5 mg/dL (ref 0.2–1.2)
Total Protein: 6.7 g/dL (ref 6.0–8.3)

## 2019-04-29 LAB — LIPID PANEL
Cholesterol: 185 mg/dL (ref 0–200)
HDL: 58 mg/dL (ref >39.00)
LDL Cholesterol: 108 mg/dL — ABNORMAL HIGH (ref 0–99)
NonHDL: 126.57
Total CHOL/HDL Ratio: 3
Triglycerides: 91 mg/dL (ref 0.0–149.0)
VLDL: 18.2 mg/dL (ref 0.0–40.0)

## 2019-04-29 LAB — HEMOGLOBIN A1C: Hgb A1c MFr Bld: 5.7 % (ref 4.6–6.5)

## 2019-04-29 LAB — TSH: TSH: 6.39 u[IU]/mL — ABNORMAL HIGH (ref 0.35–4.50)

## 2019-06-03 ENCOUNTER — Telehealth: Payer: Self-pay | Admitting: *Deleted

## 2019-06-03 DIAGNOSIS — R7989 Other specified abnormal findings of blood chemistry: Secondary | ICD-10-CM

## 2019-06-03 NOTE — Telephone Encounter (Signed)
Placed future order for TSH based on lab note

## 2019-06-06 ENCOUNTER — Other Ambulatory Visit: Payer: Self-pay

## 2019-06-06 ENCOUNTER — Other Ambulatory Visit (INDEPENDENT_AMBULATORY_CARE_PROVIDER_SITE_OTHER): Payer: PPO

## 2019-06-06 DIAGNOSIS — R7989 Other specified abnormal findings of blood chemistry: Secondary | ICD-10-CM | POA: Diagnosis not present

## 2019-06-06 LAB — TSH: TSH: 4.8 u[IU]/mL — ABNORMAL HIGH (ref 0.35–4.50)

## 2019-06-23 DIAGNOSIS — L820 Inflamed seborrheic keratosis: Secondary | ICD-10-CM | POA: Diagnosis not present

## 2019-06-23 DIAGNOSIS — Z85828 Personal history of other malignant neoplasm of skin: Secondary | ICD-10-CM | POA: Diagnosis not present

## 2019-06-23 DIAGNOSIS — L570 Actinic keratosis: Secondary | ICD-10-CM | POA: Diagnosis not present

## 2019-07-03 ENCOUNTER — Ambulatory Visit (INDEPENDENT_AMBULATORY_CARE_PROVIDER_SITE_OTHER): Payer: PPO | Admitting: Internal Medicine

## 2019-07-03 ENCOUNTER — Other Ambulatory Visit: Payer: Self-pay

## 2019-07-03 VITALS — BP 132/84 | Temp 96.2°F | Ht 63.0 in | Wt 115.0 lb

## 2019-07-03 DIAGNOSIS — M810 Age-related osteoporosis without current pathological fracture: Secondary | ICD-10-CM

## 2019-07-03 DIAGNOSIS — F439 Reaction to severe stress, unspecified: Secondary | ICD-10-CM

## 2019-07-03 DIAGNOSIS — D582 Other hemoglobinopathies: Secondary | ICD-10-CM | POA: Diagnosis not present

## 2019-07-03 DIAGNOSIS — E559 Vitamin D deficiency, unspecified: Secondary | ICD-10-CM | POA: Diagnosis not present

## 2019-07-03 DIAGNOSIS — I10 Essential (primary) hypertension: Secondary | ICD-10-CM

## 2019-07-03 DIAGNOSIS — R739 Hyperglycemia, unspecified: Secondary | ICD-10-CM | POA: Diagnosis not present

## 2019-07-03 NOTE — Progress Notes (Signed)
Patient ID: Claire Clayton, female   DOB: [birth year], 86 y.o.   MRN: [id number]   Virtual Visit via video Note  This visit type was conducted due to national recommendations for restrictions regarding the COVID-19 pandemic (e.g. social distancing).  This format is felt to be most appropriate for this patient at this time.  All issues noted in this document were discussed and addressed.  No physical exam was performed (except for noted visual exam findings with Video Visits).   I connected with Claire Clayton by a video enabled telemedicine application and verified that I am speaking with the correct person using two identifiers. Location patient: home Location provider: work Persons participating in the virtual visit: patient, provider  The limitations, risks, security and privacy concerns of performing an evaluation and management service by video and the availability of in person appointments have been discussed.   The patient expressed understanding and agreed to proceed.   Reason for visit:  Scheduled follow up.    HPI: Increased stress with family issues - her granddaughter.  Discussed with her today.  Overall she feels she is handling things relatively well.  Does not feel needs anything more at this time.  Stays active.  No chest pain or sob reported.  Eating.  No nausea or vomiting reported. No abdominal pain or bowel change.  Blood pressure overall doing well.  Varying - recent blood pressures - 105-130s/70-80s.  Taking her medication.     ROS: See pertinent positives and negatives per HPI.  Past Medical History:  Diagnosis Date  . Cancer (HCC)    squamous cell skin  . Diverticulosis   . Glaucoma   . Hypertension   . Osteoporosis   . Seasonal allergies     Past Surgical History:  Procedure Laterality Date  . BREAST EXCISIONAL BIOPSY Left 1970   benign  . CATARACT EXTRACTION, BILATERAL    . SKIN CANCER EXCISION     carcinoma (3 surgeries)  . TONSILLECTOMY  1948      Family History  Problem Relation Age of Onset  . Colon cancer Brother   . Colon polyps Sister   . Breast cancer Neg Hx     SOCIAL HX: reviewed.    Current Outpatient Medications:  .  aspirin EC 81 MG tablet, Take 81 mg by mouth daily., Disp: , Rfl:  .  atenolol (TENORMIN) 25 MG tablet, Take 1 tablet (25 mg total) by mouth daily., Disp: 90 tablet, Rfl: 0 .  busPIRone (BUSPAR) 5 MG tablet, Take 1 tablet (5 mg total) by mouth daily as needed., Disp: 30 tablet, Rfl: 0 .  Calcium Carbonate-Vitamin D 600-400 MG-UNIT per tablet, Take 1 tablet by mouth daily. , Disp: , Rfl:  .  hydrocortisone (CORTEF) 10 MG tablet, SWISH AND EXPECTORATE TWO TEASPOONFULS THREE TIMES A DAY., Disp: 240 tablet, Rfl: 0 .  lisinopril (ZESTRIL) 20 MG tablet, Take 1 tablet (20 mg total) by mouth daily., Disp: 90 tablet, Rfl: 1 .  Multiple Vitamins-Minerals (ICAPS PO), Take by mouth., Disp: , Rfl:  .  mupirocin ointment (BACTROBAN) 2 %, Apply to affected area twicd a day, Disp: 22 g, Rfl: 0 .  timolol (BETIMOL) 0.5 % ophthalmic solution, Place 1 drop into both eyes 2 (two) times daily., Disp: , Rfl:  .  valACYclovir (VALTREX) 1000 MG tablet, Take 2 tablets twice a day for 1 day with each outbreak, Disp: , Rfl:   EXAM:  VITALS per patient if applicable:  [id number]  GENERAL: alert,  oriented, appears well and in no acute distress  HEENT: atraumatic, conjunttiva clear, no obvious abnormalities on inspection of external nose and ears  NECK: normal movements of the head and neck  LUNGS: on inspection no signs of respiratory distress, breathing rate appears normal, no obvious gross SOB, gasping or wheezing  CV: no obvious cyanosis  PSYCH/NEURO: pleasant and cooperative, no obvious depression or anxiety, speech and thought processing grossly intact  ASSESSMENT AND PLAN:  Discussed the following assessment and plan:  Essential hypertension, benign Blood pressures as outlined.  Continue to spot check her pressure.   Same medication regimen.  Follow pressures.  Follow metabolic panel.   Elevated hemoglobin (HCC) Last hgb 14.8.  Follow cbc.   Osteoporosis Declines medication.  Continue calcium, vitamin D and weight bearing exercise.  Follow.    Stress Increased stress as outlined.  Discussed with her today.  Overall she feels she is handling things relatively well.  Does not feel needs any further intervention.  Follow.    Vitamin D deficiency Follow vitamin D level.    Orders Placed This Encounter  Procedures  . Hemoglobin A1c    Standing Status:   Future    Standing Expiration Date:   07/02/2020  . Hepatic function panel    Standing Status:   Future    Standing Expiration Date:   07/02/2020  . Lipid panel    Standing Status:   Future    Standing Expiration Date:   07/02/2020  . TSH    Standing Status:   Future    Standing Expiration Date:   07/02/2020  . Basic metabolic panel (future)    Standing Status:   Future    Standing Expiration Date:   07/02/2020  . VITAMIN D 25 Hydroxy (Vit-D Deficiency, Fractures)    Standing Status:   Future    Standing Expiration Date:   07/02/2020    I discussed the assessment and treatment plan with the patient. The patient was provided an opportunity to ask questions and all were answered. The patient agreed with the plan and demonstrated an understanding of the instructions.   The patient was advised to call back or seek an in-person evaluation if the symptoms worsen or if the condition fails to improve as anticipated.   Einar Pheasant, MD

## 2019-07-06 ENCOUNTER — Encounter: Payer: Self-pay | Admitting: Internal Medicine

## 2019-07-06 NOTE — Assessment & Plan Note (Signed)
Follow vitamin D level.  

## 2019-07-06 NOTE — Assessment & Plan Note (Signed)
Blood pressures as outlined.  Continue to spot check her pressure.  Same medication regimen.  Follow pressures.  Follow metabolic panel.

## 2019-07-06 NOTE — Assessment & Plan Note (Addendum)
Declines medication.  Continue calcium, vitamin D and weight bearing exercise.  Follow.

## 2019-07-06 NOTE — Assessment & Plan Note (Signed)
Increased stress as outlined.  Discussed with her today.  Overall she feels she is handling things relatively well.  Does not feel needs any further intervention.  Follow.   

## 2019-07-06 NOTE — Assessment & Plan Note (Signed)
Last hgb 14.8.  Follow cbc.

## 2019-07-28 DIAGNOSIS — L570 Actinic keratosis: Secondary | ICD-10-CM | POA: Diagnosis not present

## 2019-07-28 DIAGNOSIS — Z85828 Personal history of other malignant neoplasm of skin: Secondary | ICD-10-CM | POA: Diagnosis not present

## 2019-07-28 DIAGNOSIS — L858 Other specified epidermal thickening: Secondary | ICD-10-CM | POA: Diagnosis not present

## 2019-07-28 DIAGNOSIS — D492 Neoplasm of unspecified behavior of bone, soft tissue, and skin: Secondary | ICD-10-CM | POA: Diagnosis not present

## 2019-07-29 ENCOUNTER — Other Ambulatory Visit: Payer: Self-pay | Admitting: Internal Medicine

## 2019-07-31 DIAGNOSIS — H40153 Residual stage of open-angle glaucoma, bilateral: Secondary | ICD-10-CM | POA: Diagnosis not present

## 2019-08-25 DIAGNOSIS — Z85828 Personal history of other malignant neoplasm of skin: Secondary | ICD-10-CM | POA: Diagnosis not present

## 2019-08-25 DIAGNOSIS — L858 Other specified epidermal thickening: Secondary | ICD-10-CM | POA: Diagnosis not present

## 2019-08-25 DIAGNOSIS — L570 Actinic keratosis: Secondary | ICD-10-CM | POA: Diagnosis not present

## 2019-09-18 ENCOUNTER — Other Ambulatory Visit: Payer: Self-pay | Admitting: Internal Medicine

## 2019-09-29 DIAGNOSIS — L858 Other specified epidermal thickening: Secondary | ICD-10-CM | POA: Diagnosis not present

## 2019-09-29 DIAGNOSIS — Z85828 Personal history of other malignant neoplasm of skin: Secondary | ICD-10-CM | POA: Diagnosis not present

## 2019-09-29 DIAGNOSIS — L570 Actinic keratosis: Secondary | ICD-10-CM | POA: Diagnosis not present

## 2019-09-29 DIAGNOSIS — D492 Neoplasm of unspecified behavior of bone, soft tissue, and skin: Secondary | ICD-10-CM | POA: Diagnosis not present

## 2019-10-22 ENCOUNTER — Encounter: Payer: Self-pay | Admitting: Internal Medicine

## 2019-10-22 ENCOUNTER — Telehealth (INDEPENDENT_AMBULATORY_CARE_PROVIDER_SITE_OTHER): Payer: PPO | Admitting: Internal Medicine

## 2019-10-22 VITALS — Temp 96.6°F | Ht 63.0 in | Wt 115.0 lb

## 2019-10-22 DIAGNOSIS — K649 Unspecified hemorrhoids: Secondary | ICD-10-CM

## 2019-10-22 DIAGNOSIS — K921 Melena: Secondary | ICD-10-CM | POA: Diagnosis not present

## 2019-10-22 DIAGNOSIS — R197 Diarrhea, unspecified: Secondary | ICD-10-CM | POA: Diagnosis not present

## 2019-10-22 NOTE — Progress Notes (Signed)
Virtual Visit via Video Note  I connected with Jerelyn Scott  on 10/22/19 at  1:30 PM EDT by a video enabled telemedicine application and verified that I am speaking with the correct person using two identifiers.  Location patient: home Location provider:work or home office Persons participating in the virtual visit: patient, provider  I discussed the limitations of evaluation and management by telemedicine and the availability of in person appointments. The patient expressed understanding and agreed to proceed.   HPI: 1. Diarrhea since 10/20/19 at lunch had 3-4 x watery significant amount of stool with red blood. Denies n/v/fever. She did have ab cramps and felt sweat and like she was about to pass out and she has been doing water, pedialyte, propel water and crackers better. She was able to tolerate milkshake 10/21/19 and oatmeal this am w/o diarrhea/loose stool. She did eat out [place] last week, she has city water, and no Ab recently. 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     ROS: See pertinent positives and negatives per HPI.  Past Medical History:  Diagnosis Date  . Cancer (HCC)    squamous cell skin  . Diverticulosis   . Glaucoma   . Hypertension   . Osteoporosis   . Seasonal allergies     Past Surgical History:  Procedure Laterality Date  . BREAST EXCISIONAL BIOPSY Left 1970   benign  . CATARACT EXTRACTION, BILATERAL    . SKIN CANCER EXCISION     carcinoma (3 surgeries)  . TONSILLECTOMY  1948    Family History  Problem Relation Age of Onset  . Colon cancer Brother   . Colon polyps Sister   . Breast cancer Neg Hx     SOCIAL HX: lives at home   Current Outpatient Medications:  .  aspirin EC 81 MG tablet, Take 81 mg by mouth daily., Disp: , Rfl:  .  atenolol (TENORMIN) 25 MG tablet, Take 1 tablet (25 mg total) by mouth daily., Disp: 90  tablet, Rfl: 0 .  busPIRone (BUSPAR) 5 MG tablet, Take 1 tablet (5 mg total) by mouth daily as needed., Disp: 30 tablet, Rfl: 0 .  Calcium Carbonate-Vitamin D 600-400 MG-UNIT per tablet, Take 1 tablet by mouth daily. , Disp: , Rfl:  .  hydrocortisone (CORTEF) 10 MG tablet, SWISH AND EXPECTORATE TWO TEASPOONFULS THREE TIMES A DAY., Disp: 240 tablet, Rfl: 0 .  lisinopril (ZESTRIL) 20 MG tablet, Take 1 tablet (20 mg total) by mouth daily., Disp: 45 tablet, Rfl: 0 .  mupirocin ointment (BACTROBAN) 2 %, Apply to affected area twicd a day, Disp: 22 g, Rfl: 0 .  timolol (BETIMOL) 0.5 % ophthalmic solution, Place 1 drop into both eyes 2 (two) times daily., Disp: , Rfl:  .  valACYclovir (VALTREX) 1000 MG tablet, Take 2 tablets twice a day for 1 day with each outbreak, Disp: , Rfl:  .  Multiple Vitamins-Minerals (ICAPS PO), Take by mouth., Disp: , Rfl:   EXAM:  VITALS per patient if applicable:  GENERAL: alert, oriented, appears well and in no acute distress  HEENT: atraumatic, conjunttiva clear, no obvious abnormalities on inspection of external nose and ears  NECK: normal movements of the head and neck  LUNGS: on inspection no signs of respiratory distress, breathing rate appears normal, no obvious gross SOB, gasping or wheezing  CV: no obvious cyanosis  MS: moves all visible extremities without  noticeable abnormality  PSYCH/NEURO: pleasant and cooperative, no obvious depression or anxiety, speech and thought processing grossly intact  ASSESSMENT AND PLAN:  Discussed the following assessment and plan:  Diarrhea, unspecified type - Plan: CBC with Differential/Platelet, Urinalysis, Routine w reflex microscopic Brat diet, peptobismol to try  If continues rec stool culture or GI path panel, C diff  Consider GI in future [place] if still has blood in stool  Labs pt wants to wait until 11/25/19   -we discussed possible serious and likely etiologies, options for evaluation and workup, limitations of  telemedicine visit vs in person visit, treatment, treatment risks and precautions. Pt prefers to treat via telemedicine empirically rather then risking or undertaking an in person visit at this moment. Patient agrees to seek prompt in person care if worsening, new symptoms arise, or if is not improving with treatment.   I discussed the assessment and treatment plan with the patient. The patient was provided an opportunity to ask questions and all were answered. The patient agreed with the plan and demonstrated an understanding of the instructions.   The patient was advised to call back or seek an in-person evaluation if the symptoms worsen or if the condition fails to improve as anticipated.  Time 20 minutes Delorise Jackson, MD

## 2019-11-01 ENCOUNTER — Other Ambulatory Visit: Payer: Self-pay | Admitting: Internal Medicine

## 2019-11-03 DIAGNOSIS — L858 Other specified epidermal thickening: Secondary | ICD-10-CM | POA: Diagnosis not present

## 2019-11-03 DIAGNOSIS — L308 Other specified dermatitis: Secondary | ICD-10-CM | POA: Diagnosis not present

## 2019-11-03 DIAGNOSIS — Z85828 Personal history of other malignant neoplasm of skin: Secondary | ICD-10-CM | POA: Diagnosis not present

## 2019-11-24 DIAGNOSIS — L858 Other specified epidermal thickening: Secondary | ICD-10-CM | POA: Diagnosis not present

## 2019-11-24 DIAGNOSIS — L570 Actinic keratosis: Secondary | ICD-10-CM | POA: Diagnosis not present

## 2019-11-24 DIAGNOSIS — L820 Inflamed seborrheic keratosis: Secondary | ICD-10-CM | POA: Diagnosis not present

## 2019-11-24 DIAGNOSIS — Z85828 Personal history of other malignant neoplasm of skin: Secondary | ICD-10-CM | POA: Diagnosis not present

## 2019-11-25 ENCOUNTER — Other Ambulatory Visit (INDEPENDENT_AMBULATORY_CARE_PROVIDER_SITE_OTHER): Payer: PPO

## 2019-11-25 ENCOUNTER — Other Ambulatory Visit: Payer: Self-pay

## 2019-11-25 DIAGNOSIS — R739 Hyperglycemia, unspecified: Secondary | ICD-10-CM | POA: Diagnosis not present

## 2019-11-25 DIAGNOSIS — E559 Vitamin D deficiency, unspecified: Secondary | ICD-10-CM

## 2019-11-25 DIAGNOSIS — R197 Diarrhea, unspecified: Secondary | ICD-10-CM

## 2019-11-25 DIAGNOSIS — I10 Essential (primary) hypertension: Secondary | ICD-10-CM | POA: Diagnosis not present

## 2019-11-25 LAB — LIPID PANEL
Cholesterol: 168 mg/dL (ref 0–200)
HDL: 61.1 mg/dL (ref >39.00)
LDL Cholesterol: 92 mg/dL (ref 0–99)
NonHDL: 106.76
Total CHOL/HDL Ratio: 3
Triglycerides: 75 mg/dL (ref 0.0–149.0)
VLDL: 15 mg/dL (ref 0.0–40.0)

## 2019-11-25 LAB — URINALYSIS, ROUTINE W REFLEX MICROSCOPIC
Bilirubin Urine: NEGATIVE
Ketones, ur: NEGATIVE
Leukocytes,Ua: NEGATIVE
Nitrite: NEGATIVE
RBC / HPF: NONE SEEN (ref 0–?)
Specific Gravity, Urine: 1.01 (ref 1.000–1.030)
Total Protein, Urine: NEGATIVE
Urine Glucose: NEGATIVE
Urobilinogen, UA: 0.2 (ref 0.0–1.0)
WBC, UA: NONE SEEN (ref 0–?)
pH: 7 (ref 5.0–8.0)

## 2019-11-25 LAB — BASIC METABOLIC PANEL
BUN: 15 mg/dL (ref 6–23)
CO2: 29 mEq/L (ref 19–32)
Calcium: 9.2 mg/dL (ref 8.4–10.5)
Chloride: 104 mEq/L (ref 96–112)
Creatinine, Ser: 0.79 mg/dL (ref 0.40–1.20)
GFR: 69.03 mL/min (ref >60.00)
Glucose, Bld: 112 mg/dL — ABNORMAL HIGH (ref 70–99)
Potassium: 4.4 mEq/L (ref 3.5–5.1)
Sodium: 137 mEq/L (ref 135–145)

## 2019-11-25 LAB — CBC WITH DIFFERENTIAL/PLATELET
Basophils Absolute: 0 10*3/uL (ref 0.0–0.1)
Basophils Relative: 0.6 % (ref 0.0–3.0)
Eosinophils Absolute: 0.1 10*3/uL (ref 0.0–0.7)
Eosinophils Relative: 1.9 % (ref 0.0–5.0)
HCT: 41.2 % (ref 36.0–46.0)
Hemoglobin: 14.3 g/dL (ref 12.0–15.0)
Lymphocytes Relative: 32.7 % (ref 12.0–46.0)
Lymphs Abs: 1.9 10*3/uL (ref 0.7–4.0)
MCHC: 34.7 g/dL (ref 30.0–36.0)
MCV: 94.8 fl (ref 78.0–100.0)
Monocytes Absolute: 0.6 10*3/uL (ref 0.1–1.0)
Monocytes Relative: 10.6 % (ref 3.0–12.0)
Neutro Abs: 3.1 10*3/uL (ref 1.4–7.7)
Neutrophils Relative %: 54.2 % (ref 43.0–77.0)
Platelets: 210 10*3/uL (ref 150.0–400.0)
RBC: 4.35 Mil/uL (ref 3.87–5.11)
RDW: 13.6 % (ref 11.5–15.5)
WBC: 5.8 10*3/uL (ref 4.0–10.5)

## 2019-11-25 LAB — HEPATIC FUNCTION PANEL
ALT: 15 U/L (ref 0–35)
AST: 20 U/L (ref 0–37)
Albumin: 4.2 g/dL (ref 3.5–5.2)
Alkaline Phosphatase: 74 U/L (ref 39–117)
Bilirubin, Direct: 0.1 mg/dL (ref 0.0–0.3)
Total Bilirubin: 0.6 mg/dL (ref 0.2–1.2)
Total Protein: 6.5 g/dL (ref 6.0–8.3)

## 2019-11-25 LAB — TSH: TSH: 5.53 u[IU]/mL — ABNORMAL HIGH (ref 0.35–4.50)

## 2019-11-25 LAB — VITAMIN D 25 HYDROXY (VIT D DEFICIENCY, FRACTURES): VITD: 72.2 ng/mL (ref 30.00–100.00)

## 2019-11-25 LAB — HEMOGLOBIN A1C: Hgb A1c MFr Bld: 6 % (ref 4.6–6.5)

## 2019-11-27 ENCOUNTER — Ambulatory Visit (INDEPENDENT_AMBULATORY_CARE_PROVIDER_SITE_OTHER): Payer: PPO | Admitting: Internal Medicine

## 2019-11-27 ENCOUNTER — Other Ambulatory Visit: Payer: Self-pay

## 2019-11-27 VITALS — BP 150/98 | HR 100 | Temp 97.7°F | Resp 16 | Ht 63.0 in | Wt 111.0 lb

## 2019-11-27 DIAGNOSIS — Z1231 Encounter for screening mammogram for malignant neoplasm of breast: Secondary | ICD-10-CM | POA: Diagnosis not present

## 2019-11-27 DIAGNOSIS — E559 Vitamin D deficiency, unspecified: Secondary | ICD-10-CM

## 2019-11-27 DIAGNOSIS — Z0000 Encounter for general adult medical examination without abnormal findings: Secondary | ICD-10-CM

## 2019-11-27 DIAGNOSIS — F439 Reaction to severe stress, unspecified: Secondary | ICD-10-CM

## 2019-11-27 DIAGNOSIS — D582 Other hemoglobinopathies: Secondary | ICD-10-CM

## 2019-11-27 DIAGNOSIS — R739 Hyperglycemia, unspecified: Secondary | ICD-10-CM | POA: Diagnosis not present

## 2019-11-27 DIAGNOSIS — M810 Age-related osteoporosis without current pathological fracture: Secondary | ICD-10-CM

## 2019-11-27 DIAGNOSIS — R198 Other specified symptoms and signs involving the digestive system and abdomen: Secondary | ICD-10-CM | POA: Diagnosis not present

## 2019-11-27 DIAGNOSIS — I10 Essential (primary) hypertension: Secondary | ICD-10-CM | POA: Diagnosis not present

## 2019-11-27 NOTE — Progress Notes (Signed)
Patient ID: Claire Clayton, female   DOB: 03-05-1932, 86 y.o.   MRN: [id number]   Subjective:    Patient ID: Claire Clayton, female    DOB: [birth year], 86 y.o.   MRN: [id number]  HPI This visit occurred during the SARS-CoV-2 public health emergency.  Safety protocols were in place, including screening questions prior to the visit, additional usage of staff PPE, and extensive cleaning of exam room while observing appropriate contact time as indicated for disinfecting solutions.  Patient here for physical exam.  She reports she is doing relatively well.  Stays active.  No chest pain or sob reported.  Stomach is better.  Discussed probiotics.  Having smaller more frequent bowels movements.  No cough or congestion.  Handling stress.  Does not feel she needs any further intervention. Reviewed outside blood pressure checks - average 113-130s/70-80s.    Past Medical History:  Diagnosis Date  . Cancer (HCC)    squamous cell skin  . Diverticulosis   . Glaucoma   . Hypertension   . Osteoporosis   . Seasonal allergies    Past Surgical History:  Procedure Laterality Date  . BREAST EXCISIONAL BIOPSY Left 1970   benign  . CATARACT EXTRACTION, BILATERAL    . SKIN CANCER EXCISION     carcinoma (3 surgeries)  . TONSILLECTOMY  1948   Family History  Problem Relation Age of Onset  . Colon cancer Brother   . Colon polyps Sister   . Breast cancer Neg Hx    Social History   Socioeconomic History  . Marital status: Widowed    Spouse name: Not on file  . Number of children: 3  . Years of education: Not on file  . Highest education level: Not on file  Occupational History  . Not on file  Tobacco Use  . Smoking status: Never Smoker  . Smokeless tobacco: Never Used  Substance and Sexual Activity  . Alcohol use: Yes    Alcohol/week: 0.0 standard drinks    Comment: rare - glass of wine  . Drug use: No  . Sexual activity: Never  Other Topics Concern  . Not on file  Social History  Narrative  . Not on file   Social Determinants of Health   Financial Resource Strain:   . Difficulty of Paying Living Expenses:   Food Insecurity:   . Worried About Charity fundraiser in the Last Year:   . Arboriculturist in the Last Year:   Transportation Needs:   . Film/video editor (Medical):   Marland Kitchen Lack of Transportation (Non-Medical):   Physical Activity:   . Days of Exercise per Week:   . Minutes of Exercise per Session:   Stress:   . Feeling of Stress :   Social Connections:   . Frequency of Communication with Friends and Family:   . Frequency of Social Gatherings with Friends and Family:   . Attends Religious Services:   . Active Member of Clubs or Organizations:   . Attends Archivist Meetings:   Marland Kitchen Marital Status:     Outpatient Encounter Medications as of 11/27/2019  Medication Sig  . Niacin (VITAMIN B-3 PO) Take by mouth.  Marland Kitchen aspirin EC 81 MG tablet Take 81 mg by mouth daily.  Marland Kitchen atenolol (TENORMIN) 25 MG tablet Take 1 tablet (25 mg total) by mouth daily.  . busPIRone (BUSPAR) 5 MG tablet Take 1 tablet (5 mg total) by mouth daily as needed.  . Calcium  Carbonate-Vitamin D 600-400 MG-UNIT per tablet Take 1 tablet by mouth daily.   . hydrocortisone (CORTEF) 10 MG tablet SWISH AND EXPECTORATE TWO TEASPOONFULS THREE TIMES A DAY.  Marland Kitchen lisinopril (ZESTRIL) 20 MG tablet Take 1 tablet (20 mg total) by mouth daily.  . Multiple Vitamins-Minerals (ICAPS PO) Take by mouth.  . mupirocin ointment (BACTROBAN) 2 % Apply to affected area twicd a day  . timolol (BETIMOL) 0.5 % ophthalmic solution Place 1 drop into both eyes 2 (two) times daily.  . valACYclovir (VALTREX) 1000 MG tablet Take 2 tablets twice a day for 1 day with each outbreak   No facility-administered encounter medications on file as of 11/27/2019.    Review of Systems  Constitutional: Negative for appetite change and unexpected weight change.  HENT: Negative for congestion and sinus pressure.   Eyes:  Negative for pain and visual disturbance.  Respiratory: Negative for cough, chest tightness and shortness of breath.   Cardiovascular: Negative for chest pain, palpitations and leg swelling.  Gastrointestinal: Negative for abdominal pain, diarrhea, nausea and vomiting.  Genitourinary: Negative for difficulty urinating and frequency.  Musculoskeletal: Negative for back pain and joint swelling.  Skin: Negative for color change and rash.  Neurological: Negative for dizziness and headaches.  Hematological: Negative for adenopathy. Does not bruise/bleed easily.  Psychiatric/Behavioral: Negative for decreased concentration and dysphoric mood.       Objective:    Physical Exam Vitals reviewed.  Constitutional:      General: She is not in acute distress.    Appearance: Normal appearance. She is well-developed.  HENT:     Head: Normocephalic and atraumatic.     Right Ear: External ear normal.     Left Ear: External ear normal.  Eyes:     General: No scleral icterus.       Right eye: No discharge.        Left eye: No discharge.     Conjunctiva/sclera: Conjunctivae normal.  Neck:     Thyroid: No thyromegaly.  Cardiovascular:     Rate and Rhythm: Normal rate and regular rhythm.  Pulmonary:     Effort: No tachypnea, accessory muscle usage or respiratory distress.     Breath sounds: Normal breath sounds. No decreased breath sounds or wheezing.  Chest:     Breasts:        Right: No inverted nipple, mass, nipple discharge or tenderness (no axillary adenopathy).        Left: No inverted nipple, mass, nipple discharge or tenderness (no axilarry adenopathy).  Abdominal:     General: Bowel sounds are normal.     Palpations: Abdomen is soft.     Tenderness: There is no abdominal tenderness.  Musculoskeletal:        General: No swelling or tenderness.     Cervical back: Neck supple. No tenderness.  Lymphadenopathy:     Cervical: No cervical adenopathy.  Skin:    Findings: No erythema or  rash.  Neurological:     Mental Status: She is alert and oriented to person, place, and time.  Psychiatric:        Mood and Affect: Mood normal.        Behavior: Behavior normal.     BP (!) 150/98   Pulse 100   Temp 97.7 F (36.5 C)   Resp 16   Ht _0  (1.6 m)   Wt 111 lb (50.3 kg)   SpO2 98%   BMI 19.66 kg/m  Wt Readings from Last 3  Encounters:  11/27/19 111 lb (50.3 kg)  10/22/19 115 lb (52.2 kg)  07/03/19 115 lb (52.2 kg)     Lab Results  Component Value Date   WBC 5.8 11/25/2019   HGB 14.3 11/25/2019   HCT 41.2 11/25/2019   PLT 210.0 11/25/2019   GLUCOSE 112 (H) 11/25/2019   CHOL 168 11/25/2019   TRIG 75.0 11/25/2019   HDL 61.10 11/25/2019   LDLCALC 92 11/25/2019   ALT 15 11/25/2019   AST 20 11/25/2019   NA 137 11/25/2019   K 4.4 11/25/2019   CL 104 11/25/2019   CREATININE 0.79 11/25/2019   BUN 15 11/25/2019   CO2 29 11/25/2019   TSH 5.53 (H) 11/25/2019   INR 0.9 06/05/2018   HGBA1C 6.0 11/25/2019    DG Bone Density  Result Date: 08/22/2018 EXAM: DUAL X-RAY ABSORPTIOMETRY (DXA) FOR BONE MINERAL DENSITY IMPRESSION: Technologist: SCE PATIENT BIOGRAPHICAL: Name: Minnetta, Sandora Patient ID: [id number] Birth Date: 1931-10-26 Height: 63.0 in. Gender: Female Exam Date: 08/22/2018 Weight: 117.0 lbs. Indications: Advanced Age, Caucasian, Height Loss, History of Fracture (Adult), Osteoporotic, Postmenopausal Fractures: Left foot, Facial Bone Treatments: Oscal, Vitamin D ASSESSMENT: The BMD measured at AP Spine L1-L3 is 0.693 g/cm2 with a T-score of -4.0. This patient is considered osteoporotic according to [facility]) criteria. The quality of the scan is good. L4 was excluded due to degenerative changes. Site Region Measured Measured WHO Young Adult BMD Date       Age      Classification T-score AP Spine L1-L3 08/22/2018 84.5 Osteoporosis -4.0 0.693 g/cm2 AP Spine L1-L3 06/23/2015 81.4 Osteoporosis -3.5 0.756 g/cm2 DualFemur Neck Left 08/22/2018  84.5 Osteoporosis -2.6 0.681 g/cm2 DualFemur Neck Left 06/23/2015 81.4 Osteopenia -2.4 0.709 g/cm2 DualFemur Total Mean 08/22/2018 84.5 Osteopenia -2.3 0.718 g/cm2 DualFemur Total Mean 06/23/2015 81.4 Osteopenia -2.0 0.753 g/cm2 World Health Organization [facility]) criteria for post-menopausal, Caucasian Women: Normal:       T-score at or above -1 SD Osteopenia:   T-score between -1 and -2.5 SD Osteoporosis: T-score at or below -2.5 SD RECOMMENDATIONS: 1. All patients should optimize calcium and vitamin D intake. 2. Consider FDA-approved medical therapies in postmenopausal women and men aged 77 years and older, based on the following: a. A hip or vertebral(clinical or morphometric) fracture b. T-score < -2.5 at the femoral neck or spine after appropriate evaluation to exclude secondary causes c. Low bone mass (T-score between -1.0 and -2.5 at the femoral neck or spine) and a 10-year probability of a hip fracture > 3% or a 10-year probability of a major osteoporosis-related fracture > 20% based on the [place]-adapted WHO algorithm d. Clinician judgment and/or patient preferences may indicate treatment for people with 10-year fracture probabilities above or below these levels FOLLOW-UP: People with diagnosed cases of osteoporosis or at high risk for fracture should have regular bone mineral density tests. For patients eligible for Medicare, routine testing is allowed once every 2 years. The testing frequency can be increased to one year for patients who have rapidly progressing disease, those who are receiving or discontinuing medical therapy to restore bone mass, or have additional risk factors. Electronically Signed   By: Earle Gell M.D.   On: 08/22/2018 14:48   MM 3D SCREEN BREAST BILATERAL  Result Date: 08/23/2018 CLINICAL DATA:  Screening. EXAM: DIGITAL SCREENING BILATERAL MAMMOGRAM WITH TOMO AND CAD COMPARISON:  Previous exam(s). ACR Breast Density Category c: The breast tissue is heterogeneously dense, which may  obscure small masses. FINDINGS: There are  no findings suspicious for malignancy. Images were processed with CAD. IMPRESSION: No mammographic evidence of malignancy. A result letter of this screening mammogram will be mailed directly to the patient. RECOMMENDATION: Screening mammogram in one year. (Code:SM-B-01Y) BI-RADS CATEGORY  1: Negative. Electronically Signed   By: Dorise Bullion III M.D   On: 08/23/2018 14:01       Assessment & Plan:   Problem List Items Addressed This Visit    Change in bowel movement    Stomach overall is better.  Smaller, more frequent bowel movements.  Discussed probiotics daily.  Follow.        Elevated hemoglobin (HCC)    Follow cbc.       Essential hypertension, benign    Blood pressures from outside checks reviewed. Under good control as outlined.  Continue atenolol and lisinopril.  Follow pressures.  Follow metabolic panel.  Hold on making adjustments in medication.        Relevant Orders   TSH   Hepatic function panel   Lipid panel   Basic metabolic panel   Health care maintenance    Physical today 11/27/19.  Colonoscopy 06/09/09.  Mammogram 08/23/18 - birads I.  She will schedule.        Hyperglycemia    Follow met b and a1c.       Relevant Orders   Hemoglobin A1c   Osteoporosis    Has declined medication.  Continue calcium, vitamin D and weight bearing exercise.        Stress    Handling stress.  Does not feel needs anything more at this time.  Follow.        Vitamin D deficiency    Follow vitamin D level.         Other Visit Diagnoses    Visit for screening mammogram       Relevant Orders   MM 3D SCREEN BREAST BILATERAL       Einar Pheasant, MD

## 2019-11-27 NOTE — Patient Instructions (Signed)
Examples of probiotics:  Align, florastor, culturelle

## 2019-11-27 NOTE — Assessment & Plan Note (Signed)
Physical today 11/27/19.  Colonoscopy 06/09/09.  Mammogram 08/23/18 - birads I.  She will schedule.

## 2019-12-02 DIAGNOSIS — H40153 Residual stage of open-angle glaucoma, bilateral: Secondary | ICD-10-CM | POA: Diagnosis not present

## 2019-12-03 ENCOUNTER — Encounter: Payer: Self-pay | Admitting: Internal Medicine

## 2019-12-03 NOTE — Assessment & Plan Note (Addendum)
Blood pressures from outside checks reviewed. Under good control as outlined.  Continue atenolol and lisinopril.  Follow pressures.  Follow metabolic panel.  Hold on making adjustments in medication.

## 2019-12-03 NOTE — Assessment & Plan Note (Signed)
Follow vitamin D level.  

## 2019-12-03 NOTE — Assessment & Plan Note (Signed)
Has declined medication.  Continue calcium, vitamin D and weight bearing exercise.

## 2019-12-03 NOTE — Assessment & Plan Note (Signed)
Handling stress.  Does not feel needs anything more at this time.  Follow.

## 2019-12-05 ENCOUNTER — Encounter: Payer: Self-pay | Admitting: Internal Medicine

## 2019-12-05 DIAGNOSIS — R198 Other specified symptoms and signs involving the digestive system and abdomen: Secondary | ICD-10-CM | POA: Insufficient documentation

## 2019-12-05 DIAGNOSIS — R739 Hyperglycemia, unspecified: Secondary | ICD-10-CM | POA: Insufficient documentation

## 2019-12-05 NOTE — Addendum Note (Signed)
Addended by: Alisa Graff on: 12/05/2019 03:18 AM   Modules accepted: Level of Service

## 2019-12-05 NOTE — Assessment & Plan Note (Signed)
Follow met b and a1c.  

## 2019-12-05 NOTE — Assessment & Plan Note (Signed)
Stomach overall is better.  Smaller, more frequent bowel movements.  Discussed probiotics daily.  Follow.

## 2019-12-05 NOTE — Assessment & Plan Note (Signed)
Follow cbc.  

## 2019-12-09 ENCOUNTER — Telehealth: Payer: Self-pay

## 2019-12-09 ENCOUNTER — Ambulatory Visit: Payer: PPO

## 2019-12-09 NOTE — Telephone Encounter (Signed)
Failed attempt to reach patient for scheduled awv. No answer, left vm to call nurse back in allotted timeframe or reschedule. Please reschedule as appropriate.

## 2019-12-19 ENCOUNTER — Other Ambulatory Visit: Payer: Self-pay | Admitting: Internal Medicine

## 2019-12-19 DIAGNOSIS — H43813 Vitreous degeneration, bilateral: Secondary | ICD-10-CM | POA: Diagnosis not present

## 2019-12-19 DIAGNOSIS — D3132 Benign neoplasm of left choroid: Secondary | ICD-10-CM | POA: Diagnosis not present

## 2019-12-19 DIAGNOSIS — H353132 Nonexudative age-related macular degeneration, bilateral, intermediate dry stage: Secondary | ICD-10-CM | POA: Diagnosis not present

## 2019-12-25 DIAGNOSIS — L570 Actinic keratosis: Secondary | ICD-10-CM | POA: Diagnosis not present

## 2020-01-08 ENCOUNTER — Ambulatory Visit
Admission: RE | Admit: 2020-01-08 | Discharge: 2020-01-08 | Disposition: A | Discharge status: Home / Self Care | Payer: PPO | Source: Ambulatory Visit | Attending: Internal Medicine | Admitting: Internal Medicine

## 2020-01-08 DIAGNOSIS — Z1231 Encounter for screening mammogram for malignant neoplasm of breast: Secondary | ICD-10-CM | POA: Diagnosis not present

## 2020-02-04 ENCOUNTER — Other Ambulatory Visit: Payer: Self-pay | Admitting: Internal Medicine

## 2020-02-12 DIAGNOSIS — D492 Neoplasm of unspecified behavior of bone, soft tissue, and skin: Secondary | ICD-10-CM | POA: Diagnosis not present

## 2020-02-12 DIAGNOSIS — L858 Other specified epidermal thickening: Secondary | ICD-10-CM | POA: Diagnosis not present

## 2020-02-12 DIAGNOSIS — L820 Inflamed seborrheic keratosis: Secondary | ICD-10-CM | POA: Diagnosis not present

## 2020-02-12 DIAGNOSIS — Z85828 Personal history of other malignant neoplasm of skin: Secondary | ICD-10-CM | POA: Diagnosis not present

## 2020-02-17 ENCOUNTER — Telehealth: Payer: Self-pay | Admitting: Internal Medicine

## 2020-02-17 NOTE — Telephone Encounter (Signed)
Left message for patient to call back and schedule Medicare Annual Wellness Visit (AWV)   This should be a telephone visit only=30 minutes.  Last AWV 05/30/17; please schedule at anytime with Denisa O'Brien-Blaney at [facility].

## 2020-02-19 IMAGING — MG DIGITAL SCREENING BILATERAL MAMMOGRAM WITH TOMO AND CAD
8 series · 9 of 24 positions shown · non-contrast
Comparison: Previous exam(s).

CLINICAL DATA: Screening.

EXAM:
DIGITAL SCREENING BILATERAL MAMMOGRAM WITH TOMO AND CAD

[L MLO synth-2D]
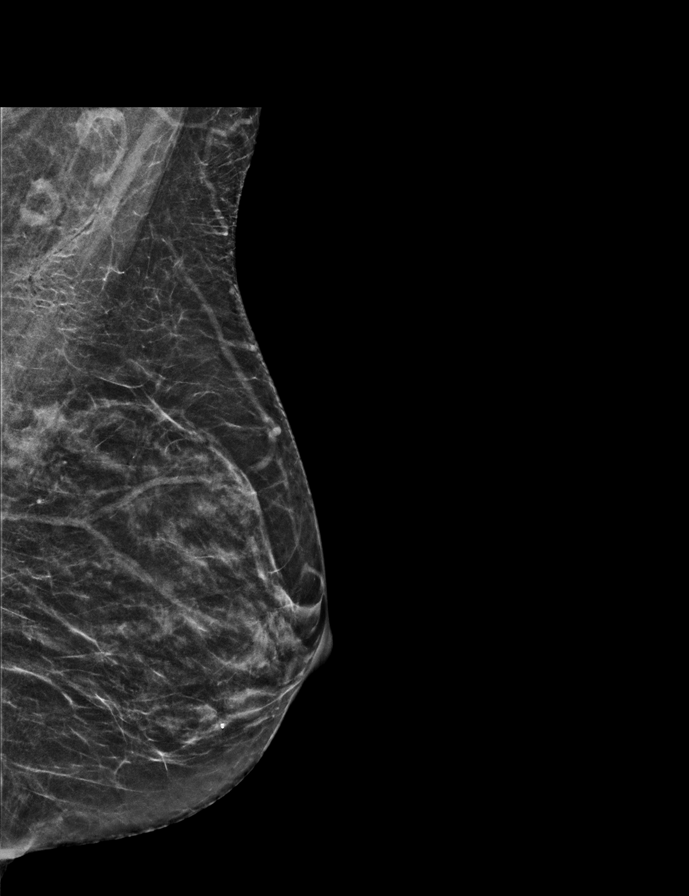

[R CC synth-2D]
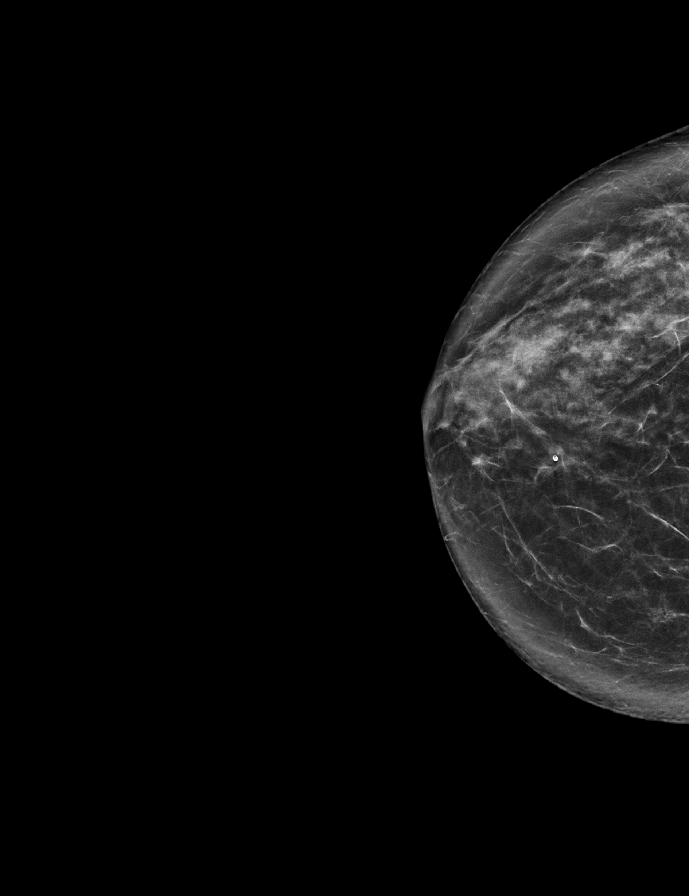

[L CC synth-2D]
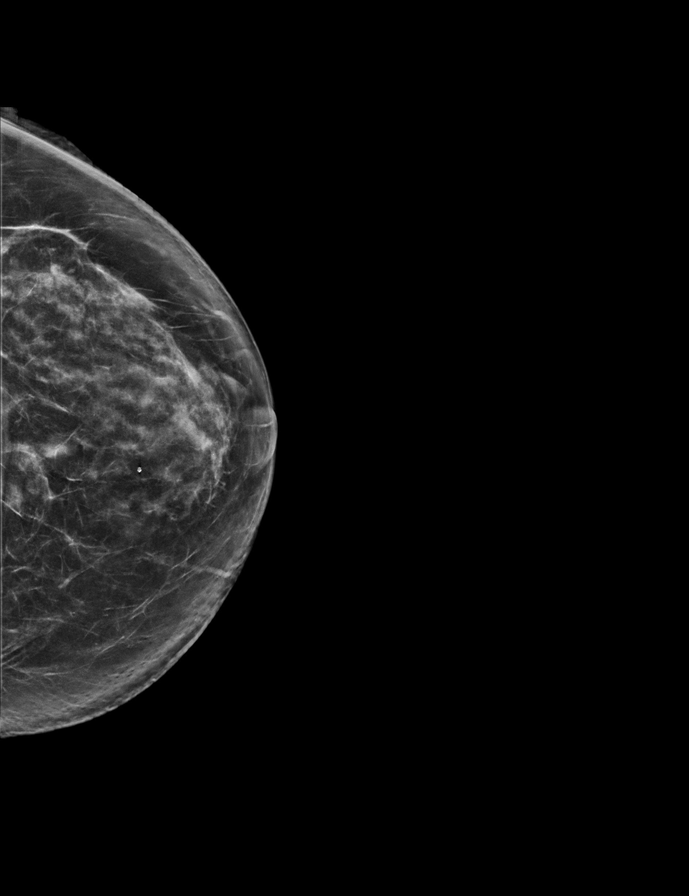

[R MLO synth-2D]
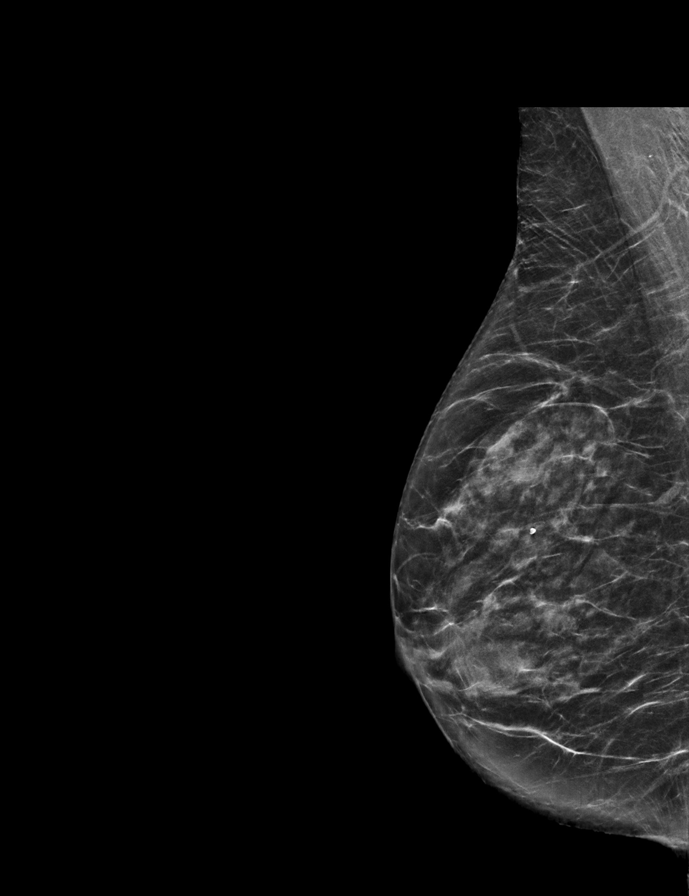

[R CC tomo · 2 of 65 frames shown]
[frame 21/65]
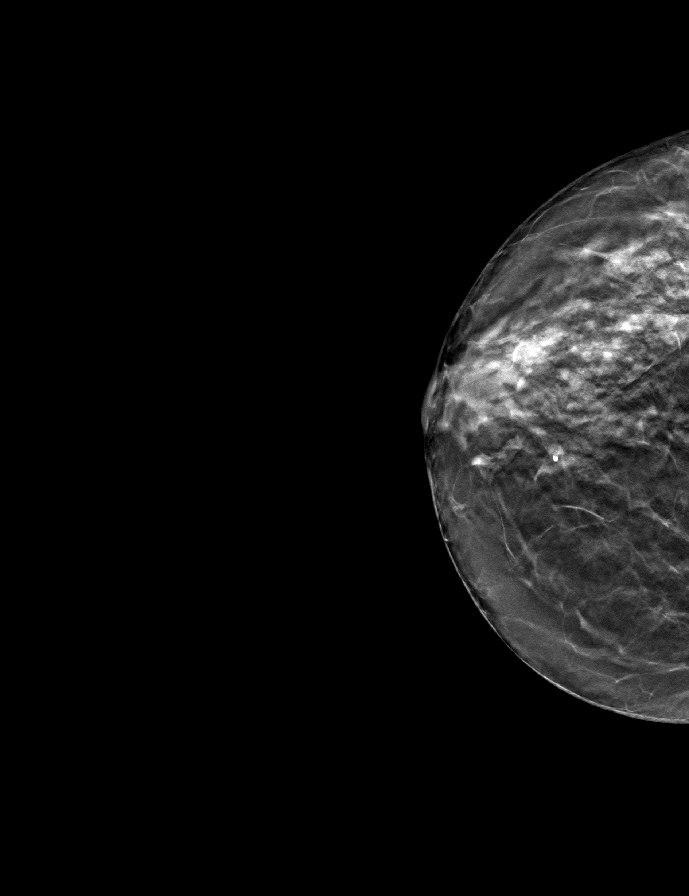
[frame 33/65]
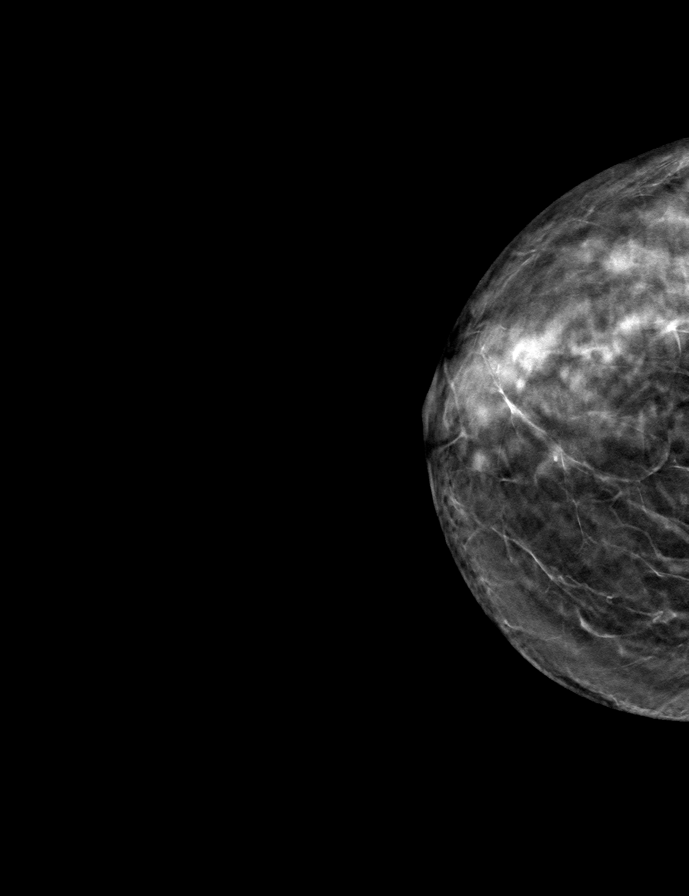

[L CC tomo · tomo slice 35/70.0]
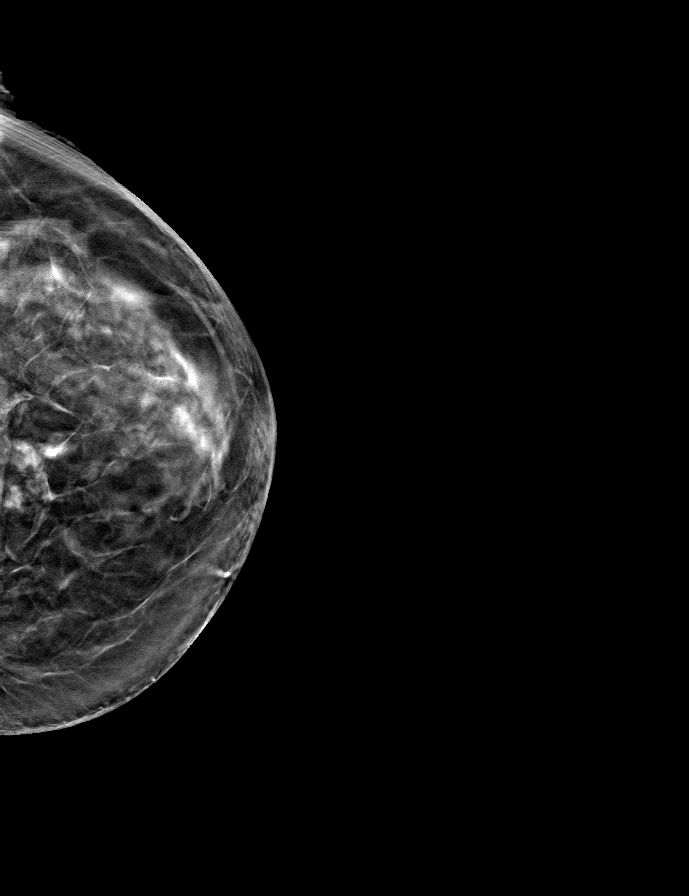

[L MLO tomo · tomo slice 23/46.0]
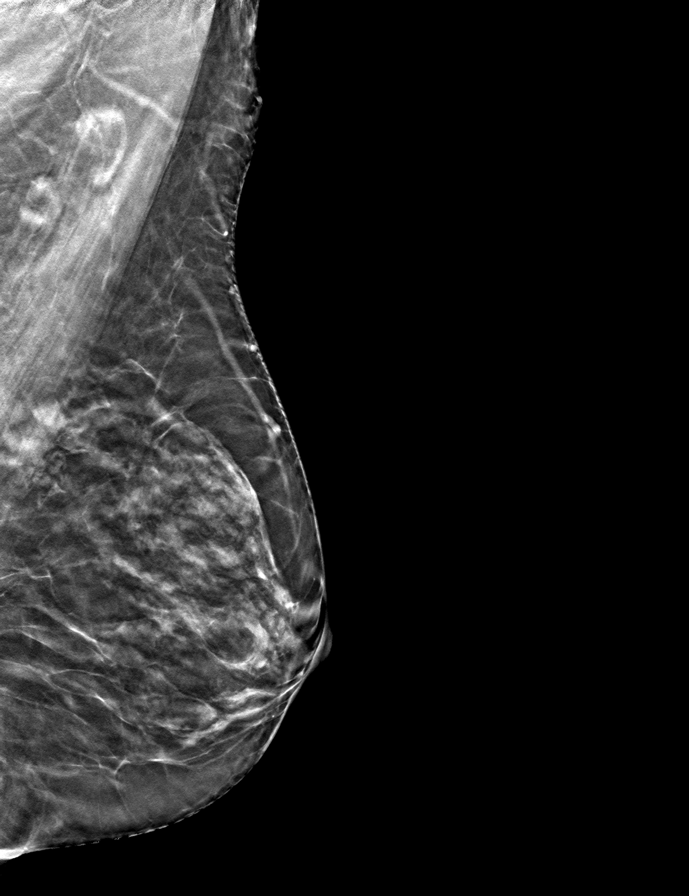

[R MLO tomo · tomo slice 31/62.0]
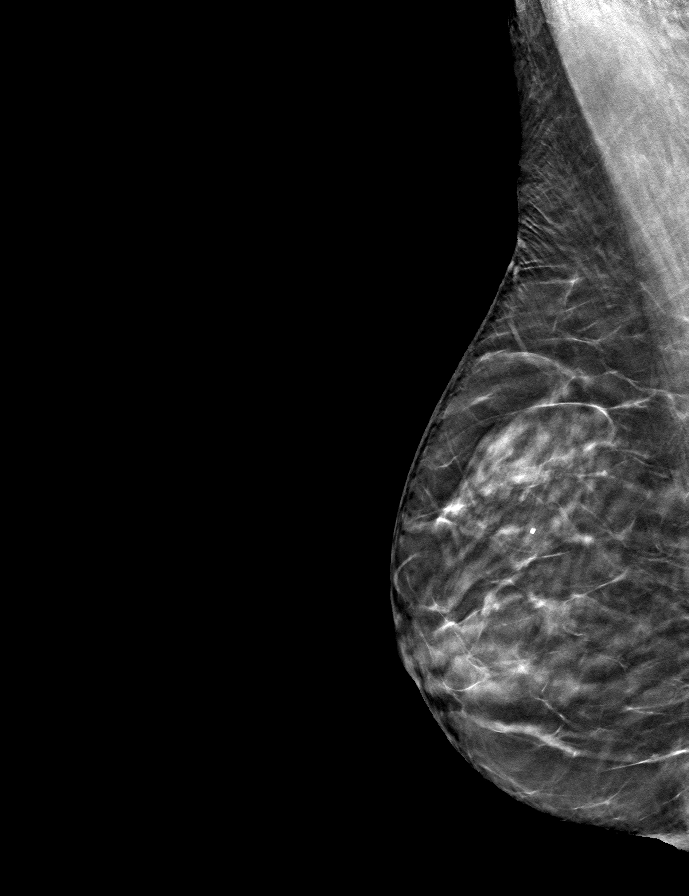

[9 of 24 positions shown; findings below may reference images not displayed]

ACR Breast Density Category c: The breast tissue is heterogeneously
dense, which may obscure small masses.
FINDINGS: There are no findings suspicious for malignancy. Images were
processed with CAD.
IMPRESSION: No mammographic evidence of malignancy. A result letter of this
screening mammogram will be mailed directly to the patient.

RECOMMENDATION:
Screening mammogram in one year. (Code:FT-U-LHB)

BI-RADS CATEGORY  1: Negative.

## 2020-03-18 ENCOUNTER — Other Ambulatory Visit: Payer: Self-pay | Admitting: Internal Medicine

## 2020-03-25 ENCOUNTER — Other Ambulatory Visit: Payer: Self-pay

## 2020-03-25 ENCOUNTER — Other Ambulatory Visit (INDEPENDENT_AMBULATORY_CARE_PROVIDER_SITE_OTHER): Payer: PPO

## 2020-03-25 DIAGNOSIS — I10 Essential (primary) hypertension: Secondary | ICD-10-CM | POA: Diagnosis not present

## 2020-03-25 DIAGNOSIS — R739 Hyperglycemia, unspecified: Secondary | ICD-10-CM

## 2020-03-25 LAB — LIPID PANEL
Cholesterol: 180 mg/dL (ref 0–200)
HDL: 55.9 mg/dL (ref >39.00)
LDL Cholesterol: 105 mg/dL — ABNORMAL HIGH (ref 0–99)
NonHDL: 124.41
Total CHOL/HDL Ratio: 3
Triglycerides: 97 mg/dL (ref 0.0–149.0)
VLDL: 19.4 mg/dL (ref 0.0–40.0)

## 2020-03-25 LAB — HEPATIC FUNCTION PANEL
ALT: 13 U/L (ref 0–35)
AST: 17 U/L (ref 0–37)
Albumin: 4.1 g/dL (ref 3.5–5.2)
Alkaline Phosphatase: 93 U/L (ref 39–117)
Bilirubin, Direct: 0.1 mg/dL (ref 0.0–0.3)
Total Bilirubin: 0.5 mg/dL (ref 0.2–1.2)
Total Protein: 6.6 g/dL (ref 6.0–8.3)

## 2020-03-25 LAB — HEMOGLOBIN A1C: Hgb A1c MFr Bld: 5.8 % (ref 4.6–6.5)

## 2020-03-25 LAB — BASIC METABOLIC PANEL
BUN: 16 mg/dL (ref 6–23)
CO2: 31 mEq/L (ref 19–32)
Calcium: 9.4 mg/dL (ref 8.4–10.5)
Chloride: 101 mEq/L (ref 96–112)
Creatinine, Ser: 0.74 mg/dL (ref 0.40–1.20)
GFR: 73.27 mL/min (ref >60.00)
Glucose, Bld: 107 mg/dL — ABNORMAL HIGH (ref 70–99)
Potassium: 4.6 mEq/L (ref 3.5–5.1)
Sodium: 138 mEq/L (ref 135–145)

## 2020-03-25 LAB — TSH: TSH: 6.76 u[IU]/mL — ABNORMAL HIGH (ref 0.35–4.50)

## 2020-03-29 ENCOUNTER — Ambulatory Visit: Payer: PPO | Admitting: Internal Medicine

## 2020-04-07 ENCOUNTER — Encounter: Payer: Self-pay | Admitting: Internal Medicine

## 2020-04-07 ENCOUNTER — Ambulatory Visit (INDEPENDENT_AMBULATORY_CARE_PROVIDER_SITE_OTHER): Payer: PPO | Admitting: Internal Medicine

## 2020-04-07 ENCOUNTER — Other Ambulatory Visit: Payer: Self-pay

## 2020-04-07 DIAGNOSIS — R7989 Other specified abnormal findings of blood chemistry: Secondary | ICD-10-CM

## 2020-04-07 DIAGNOSIS — E559 Vitamin D deficiency, unspecified: Secondary | ICD-10-CM | POA: Diagnosis not present

## 2020-04-07 DIAGNOSIS — I10 Essential (primary) hypertension: Secondary | ICD-10-CM

## 2020-04-07 DIAGNOSIS — R739 Hyperglycemia, unspecified: Secondary | ICD-10-CM

## 2020-04-07 DIAGNOSIS — F439 Reaction to severe stress, unspecified: Secondary | ICD-10-CM | POA: Diagnosis not present

## 2020-04-07 NOTE — Progress Notes (Signed)
Patient ID: Claire Clayton, female   DOB: Feb 02, 1932, 86 y.o.   MRN: [id number]   Subjective:    Patient ID: Claire Clayton, female    DOB: Nov 24, 1931, 86 y.o.   MRN: [id number]  HPI This visit occurred during the SARS-CoV-2 public health emergency.  Safety protocols were in place, including screening questions prior to the visit, additional usage of staff PPE, and extensive cleaning of exam room while observing appropriate contact time as indicated for disinfecting solutions.  Patient here for scheduled follow up.  She reports she is doing relatively well.  Here to f/u regarding her blood pressure.  Reviewed her outside blood pressure readings - averaging 115-130/60s-80.  Taking lisinopril and atenolol.  Stays active.  No chest pain or sob reported.  No abdominal pain or bowel change reported.  Handling stress.  Discussed labs.   Past Medical History:  Diagnosis Date   Cancer ([place])    squamous cell skin   Diverticulosis    Glaucoma    Hypertension    Osteoporosis    Seasonal allergies    Past Surgical History:  Procedure Laterality Date   BREAST EXCISIONAL BIOPSY Left 1970   benign   CATARACT EXTRACTION, BILATERAL     SKIN CANCER EXCISION     carcinoma (3 surgeries)   TONSILLECTOMY  1948   Family History  Problem Relation Age of Onset   Colon cancer Brother    Colon polyps Sister    Breast cancer Neg Hx    Social History   Socioeconomic History   Marital status: Widowed    Spouse name: Not on file   Number of children: 3   Years of education: Not on file   Highest education level: Not on file  Occupational History   Not on file  Tobacco Use   Smoking status: Never Smoker   Smokeless tobacco: Never Used  Substance and Sexual Activity   Alcohol use: Yes    Alcohol/week: 0.0 standard drinks    Comment: rare - glass of wine   Drug use: No   Sexual activity: Never  Other Topics Concern   Not on file  Social History Narrative   Not on  file   Social Determinants of Health   Financial Resource Strain:    Difficulty of Paying Living Expenses: Not on file  Food Insecurity:    Worried About [place] in the Last Year: Not on file   Ran Out of Food in the Last Year: Not on file  Transportation Needs:    Lack of Transportation (Medical): Not on file   Lack of Transportation (Non-Medical): Not on file  Physical Activity:    Days of Exercise per Week: Not on file   Minutes of Exercise per Session: Not on file  Stress:    Feeling of Stress : Not on file  Social Connections:    Frequency of Communication with Friends and Family: Not on file   Frequency of Social Gatherings with Friends and Family: Not on file   Attends Religious Services: Not on file   Active Member of Clubs or Organizations: Not on file   Attends Club or Organization Meetings: Not on file   Marital Status: Not on file    Outpatient Encounter Medications as of 04/07/2020  Medication Sig   aspirin EC 81 MG tablet Take 81 mg by mouth daily.   atenolol (TENORMIN) 25 MG tablet Take 1 tablet (25 mg total) by mouth daily.   busPIRone (BUSPAR)  5 MG tablet Take 1 tablet (5 mg total) by mouth daily as needed.   Calcium Carbonate-Vitamin D 600-400 MG-UNIT per tablet Take 1 tablet by mouth daily.    hydrocortisone (CORTEF) 10 MG tablet SWISH AND EXPECTORATE TWO TEASPOONFULS THREE TIMES A DAY.   lisinopril (ZESTRIL) 20 MG tablet Take 1 tablet (20 mg total) by mouth daily.   Multiple Vitamins-Minerals (ICAPS PO) Take by mouth.   mupirocin ointment (BACTROBAN) 2 % Apply to affected area twicd a day   Niacin (VITAMIN B-3 PO) Take by mouth.   timolol (BETIMOL) 0.5 % ophthalmic solution Place 1 drop into both eyes 2 (two) times daily.   valACYclovir (VALTREX) 1000 MG tablet Take 2 tablets twice a day for 1 day with each outbreak   No facility-administered encounter medications on file as of 04/07/2020.    Review of Systems    Constitutional: Negative for appetite change and unexpected weight change.  HENT: Negative for congestion and sinus pressure.   Respiratory: Negative for cough, chest tightness and shortness of breath.   Cardiovascular: Negative for chest pain and palpitations.  Gastrointestinal: Negative for abdominal pain, diarrhea, nausea and vomiting.  Genitourinary: Negative for difficulty urinating and dysuria.  Musculoskeletal: Negative for joint swelling and myalgias.  Skin: Negative for color change and rash.  Neurological: Negative for dizziness, light-headedness and headaches.  Psychiatric/Behavioral: Negative for agitation and dysphoric mood.       Objective:    Physical Exam Vitals reviewed.  Constitutional:      General: She is not in acute distress.    Appearance: Normal appearance.  HENT:     Head: Normocephalic and atraumatic.     Right Ear: External ear normal.     Left Ear: External ear normal.  Eyes:     General: No scleral icterus.       Right eye: No discharge.        Left eye: No discharge.     Conjunctiva/sclera: Conjunctivae normal.  Neck:     Thyroid: No thyromegaly.  Cardiovascular:     Rate and Rhythm: Normal rate and regular rhythm.  Pulmonary:     Effort: No respiratory distress.     Breath sounds: Normal breath sounds. No wheezing.  Abdominal:     General: Bowel sounds are normal.     Palpations: Abdomen is soft.     Tenderness: There is no abdominal tenderness.  Musculoskeletal:        General: No swelling or tenderness.     Cervical back: Neck supple. No tenderness.  Lymphadenopathy:     Cervical: No cervical adenopathy.  Skin:    Findings: No erythema or rash.  Neurological:     Mental Status: She is alert.  Psychiatric:        Mood and Affect: Mood normal.        Behavior: Behavior normal.     BP (!) 158/92    Pulse 79    Temp 98.1 F (36.7 C) (Oral)    Resp 16    Ht $R'5\' 3"'sh$  (1.6 m)    Wt 112 lb 12.8 oz (51.2 kg)    SpO2 98%    BMI 19.98 kg/m   Wt Readings from Last 3 Encounters:  04/07/20 112 lb 12.8 oz (51.2 kg)  11/27/19 111 lb (50.3 kg)  10/22/19 115 lb (52.2 kg)     Lab Results  Component Value Date   WBC 5.8 11/25/2019   HGB 14.3 11/25/2019   HCT 41.2 11/25/2019  PLT 210.0 11/25/2019   GLUCOSE 107 (H) 03/25/2020   CHOL 180 03/25/2020   TRIG 97.0 03/25/2020   HDL 55.90 03/25/2020   LDLCALC 105 (H) 03/25/2020   ALT 13 03/25/2020   AST 17 03/25/2020   NA 138 03/25/2020   K 4.6 03/25/2020   CL 101 03/25/2020   CREATININE 0.74 03/25/2020   BUN 16 03/25/2020   CO2 31 03/25/2020   TSH 6.76 (H) 03/25/2020   INR 0.9 06/05/2018   HGBA1C 5.8 03/25/2020    MM 3D SCREEN BREAST BILATERAL  Result Date: 01/09/2020 CLINICAL DATA:  Screening. EXAM: DIGITAL SCREENING BILATERAL MAMMOGRAM WITH TOMO AND CAD COMPARISON:  Previous exam(s). ACR Breast Density Category c: The breast tissue is heterogeneously dense, which may obscure small masses. FINDINGS: There are no findings suspicious for malignancy. Images were processed with CAD. IMPRESSION: No mammographic evidence of malignancy. A result letter of this screening mammogram will be mailed directly to the patient. RECOMMENDATION: Screening mammogram in one year. (Code:SM-B-01Y) BI-RADS CATEGORY  1: Negative. Electronically Signed   By: Evangeline Dakin M.D.   On: 01/09/2020 12:59       Assessment & Plan:   Problem List Items Addressed This Visit    Vitamin D deficiency    Follow vitamin D level.       Stress    Overall doing well.  Does not feel needs any further intervention.  Follow.       Hyperglycemia    Recent a1c 5.8.  Follow met b and a1c.        Essential hypertension, benign    Blood pressure on outside checks under good control.  Elevated in the office.  Continue atenolol and lisinopril. Hold on making changes after review of outside checks.  Have her continue to spot check her pressure.  Send in readings.  Follow metabolic panel.       Elevated TSH     Slightly elevated tsh.  Recheck tsh and free T4 in 6 weeks.       Relevant Orders   TSH   T4, free       Einar Pheasant, MD

## 2020-04-15 DIAGNOSIS — C44712 Basal cell carcinoma of skin of right lower limb, including hip: Secondary | ICD-10-CM | POA: Diagnosis not present

## 2020-04-15 DIAGNOSIS — L858 Other specified epidermal thickening: Secondary | ICD-10-CM | POA: Diagnosis not present

## 2020-04-18 ENCOUNTER — Telehealth: Payer: Self-pay | Admitting: Internal Medicine

## 2020-04-18 ENCOUNTER — Encounter: Payer: Self-pay | Admitting: Internal Medicine

## 2020-04-18 DIAGNOSIS — R7989 Other specified abnormal findings of blood chemistry: Secondary | ICD-10-CM | POA: Insufficient documentation

## 2020-04-18 NOTE — Assessment & Plan Note (Signed)
Slightly elevated tsh.  Recheck tsh and free T4 in 6 weeks.

## 2020-04-18 NOTE — Assessment & Plan Note (Signed)
Recent a1c 5.8.  Follow met b and a1c.

## 2020-04-18 NOTE — Assessment & Plan Note (Signed)
Blood pressure on outside checks under good control.  Elevated in the office.  Continue atenolol and lisinopril. Hold on making changes after review of outside checks.  Have her continue to spot check her pressure.  Send in readings.  Follow metabolic panel.

## 2020-04-18 NOTE — Telephone Encounter (Signed)
Please schedule pt for a non fasting lab appt in 6 weeks.  I wants to recheck her thyroid.  Thanks

## 2020-04-18 NOTE — Assessment & Plan Note (Signed)
Overall doing well.  Does not feel needs any further intervention.  Follow.

## 2020-04-18 NOTE — Assessment & Plan Note (Signed)
Follow vitamin D level.  

## 2020-04-20 NOTE — Telephone Encounter (Signed)
Called and scheduled pt

## 2020-04-28 DIAGNOSIS — H40153 Residual stage of open-angle glaucoma, bilateral: Secondary | ICD-10-CM | POA: Diagnosis not present

## 2020-05-04 ENCOUNTER — Other Ambulatory Visit: Payer: Self-pay | Admitting: Internal Medicine

## 2020-06-01 ENCOUNTER — Other Ambulatory Visit: Payer: Self-pay

## 2020-06-01 ENCOUNTER — Other Ambulatory Visit (INDEPENDENT_AMBULATORY_CARE_PROVIDER_SITE_OTHER): Payer: PPO

## 2020-06-01 DIAGNOSIS — R7989 Other specified abnormal findings of blood chemistry: Secondary | ICD-10-CM | POA: Diagnosis not present

## 2020-06-01 LAB — TSH: TSH: 4.52 u[IU]/mL — ABNORMAL HIGH (ref 0.35–4.50)

## 2020-06-01 LAB — T4, FREE: Free T4: 0.82 ng/dL (ref 0.60–1.60)

## 2020-06-21 ENCOUNTER — Other Ambulatory Visit: Payer: Self-pay | Admitting: Internal Medicine

## 2020-07-15 ENCOUNTER — Telehealth: Payer: Self-pay | Admitting: Internal Medicine

## 2020-07-15 NOTE — Telephone Encounter (Signed)
Left message for patient to call back and schedule Medicare Annual Wellness Visit (AWV)   This should be a virtual visit only=30 minutes.  Last AWV 05/30/17; please schedule at anytime with Denisa O'Brien-Blaney at [facility].

## 2020-07-20 ENCOUNTER — Other Ambulatory Visit: Payer: Self-pay | Admitting: Internal Medicine

## 2020-07-22 DIAGNOSIS — C44722 Squamous cell carcinoma of skin of right lower limb, including hip: Secondary | ICD-10-CM | POA: Diagnosis not present

## 2020-07-22 DIAGNOSIS — D492 Neoplasm of unspecified behavior of bone, soft tissue, and skin: Secondary | ICD-10-CM | POA: Diagnosis not present

## 2020-07-22 DIAGNOSIS — L570 Actinic keratosis: Secondary | ICD-10-CM | POA: Diagnosis not present

## 2020-07-28 DIAGNOSIS — H353132 Nonexudative age-related macular degeneration, bilateral, intermediate dry stage: Secondary | ICD-10-CM | POA: Diagnosis not present

## 2020-08-03 ENCOUNTER — Telehealth: Payer: Self-pay | Admitting: *Deleted

## 2020-08-03 ENCOUNTER — Other Ambulatory Visit: Payer: Self-pay | Admitting: Internal Medicine

## 2020-08-03 DIAGNOSIS — I10 Essential (primary) hypertension: Secondary | ICD-10-CM

## 2020-08-03 DIAGNOSIS — R739 Hyperglycemia, unspecified: Secondary | ICD-10-CM

## 2020-08-03 DIAGNOSIS — Z13220 Encounter for screening for lipoid disorders: Secondary | ICD-10-CM

## 2020-08-03 DIAGNOSIS — R7989 Other specified abnormal findings of blood chemistry: Secondary | ICD-10-CM

## 2020-08-03 NOTE — Telephone Encounter (Signed)
Orders placed for f/u labs.  

## 2020-08-03 NOTE — Telephone Encounter (Signed)
Please place future orders for lab appt.  

## 2020-08-05 ENCOUNTER — Other Ambulatory Visit: Payer: Self-pay

## 2020-08-05 ENCOUNTER — Other Ambulatory Visit (INDEPENDENT_AMBULATORY_CARE_PROVIDER_SITE_OTHER): Payer: PPO

## 2020-08-05 DIAGNOSIS — Z13220 Encounter for screening for lipoid disorders: Secondary | ICD-10-CM

## 2020-08-05 DIAGNOSIS — R739 Hyperglycemia, unspecified: Secondary | ICD-10-CM

## 2020-08-05 DIAGNOSIS — R7989 Other specified abnormal findings of blood chemistry: Secondary | ICD-10-CM

## 2020-08-05 DIAGNOSIS — I10 Essential (primary) hypertension: Secondary | ICD-10-CM | POA: Diagnosis not present

## 2020-08-05 LAB — HEPATIC FUNCTION PANEL
ALT: 16 U/L (ref 0–35)
AST: 19 U/L (ref 0–37)
Albumin: 4.2 g/dL (ref 3.5–5.2)
Alkaline Phosphatase: 83 U/L (ref 39–117)
Bilirubin, Direct: 0.1 mg/dL (ref 0.0–0.3)
Total Bilirubin: 0.6 mg/dL (ref 0.2–1.2)
Total Protein: 6.5 g/dL (ref 6.0–8.3)

## 2020-08-05 LAB — BASIC METABOLIC PANEL
BUN: 13 mg/dL (ref 6–23)
CO2: 32 mEq/L (ref 19–32)
Calcium: 9.3 mg/dL (ref 8.4–10.5)
Chloride: 100 mEq/L (ref 96–112)
Creatinine, Ser: 0.71 mg/dL (ref 0.40–1.20)
GFR: 76.93 mL/min (ref >60.00)
Glucose, Bld: 105 mg/dL — ABNORMAL HIGH (ref 70–99)
Potassium: 3.8 mEq/L (ref 3.5–5.1)
Sodium: 136 mEq/L (ref 135–145)

## 2020-08-05 LAB — LIPID PANEL
Cholesterol: 189 mg/dL (ref 0–200)
HDL: 64.8 mg/dL (ref >39.00)
LDL Cholesterol: 106 mg/dL — ABNORMAL HIGH (ref 0–99)
NonHDL: 124.18
Total CHOL/HDL Ratio: 3
Triglycerides: 93 mg/dL (ref 0.0–149.0)
VLDL: 18.6 mg/dL (ref 0.0–40.0)

## 2020-08-05 LAB — HEMOGLOBIN A1C: Hgb A1c MFr Bld: 5.8 % (ref 4.6–6.5)

## 2020-08-05 LAB — T4, FREE: Free T4: 0.8 ng/dL (ref 0.60–1.60)

## 2020-08-05 LAB — TSH: TSH: 6.76 u[IU]/mL — ABNORMAL HIGH (ref 0.35–4.50)

## 2020-08-09 ENCOUNTER — Other Ambulatory Visit: Payer: Self-pay

## 2020-08-09 ENCOUNTER — Ambulatory Visit (INDEPENDENT_AMBULATORY_CARE_PROVIDER_SITE_OTHER): Payer: PPO | Admitting: Internal Medicine

## 2020-08-09 DIAGNOSIS — E559 Vitamin D deficiency, unspecified: Secondary | ICD-10-CM

## 2020-08-09 DIAGNOSIS — D582 Other hemoglobinopathies: Secondary | ICD-10-CM | POA: Diagnosis not present

## 2020-08-09 DIAGNOSIS — R739 Hyperglycemia, unspecified: Secondary | ICD-10-CM | POA: Diagnosis not present

## 2020-08-09 DIAGNOSIS — F439 Reaction to severe stress, unspecified: Secondary | ICD-10-CM | POA: Diagnosis not present

## 2020-08-09 DIAGNOSIS — E7800 Pure hypercholesterolemia, unspecified: Secondary | ICD-10-CM

## 2020-08-09 DIAGNOSIS — I10 Essential (primary) hypertension: Secondary | ICD-10-CM | POA: Diagnosis not present

## 2020-08-09 DIAGNOSIS — R7989 Other specified abnormal findings of blood chemistry: Secondary | ICD-10-CM | POA: Diagnosis not present

## 2020-08-09 MED ORDER — BUSPIRONE HCL 5 MG PO TABS: 5.0000 mg | ORAL_TABLET | Freq: Every day | ORAL | 0 refills | Status: DC | PRN

## 2020-08-09 NOTE — Patient Instructions (Signed)
Examples of probiotics:  Align, culturelle or florastor

## 2020-08-09 NOTE — Progress Notes (Signed)
Patient ID: SUANNE MINAHAN, female   DOB: Mar 10, 1932, 87 y.o.   MRN: [id number]   Subjective:    Patient ID: SAPHIA VANDERFORD, female    DOB: 1931/07/27, 87 y.o.   MRN: [id number]  HPI This visit occurred during the SARS-CoV-2 public health emergency.  Safety protocols were in place, including screening questions prior to the visit, additional usage of staff PPE, and extensive cleaning of exam room while observing appropriate contact time as indicated for disinfecting solutions.  Patient here for a scheduled follow up.  Here to follow up regarding her blood pressure and cholesterol.  Still with increased stress.  Her son's dog is sick. Family stress with her granddaughter's health issues.  She feels she is handling things relatively well.  Stays active.  No chest pain or sob reported.  No abdominal pain or bowel change reported.  Some increased gas. Discussed monitoring diet and possible good triggers.  Discussed taking probiotics.  Outside blood pressures - ok.  Elevated here.    Past Medical History:  Diagnosis Date  . Cancer (HCC)    squamous cell skin  . Diverticulosis   . Glaucoma   . Hypertension   . Osteoporosis   . Seasonal allergies    Past Surgical History:  Procedure Laterality Date  . BREAST EXCISIONAL BIOPSY Left 1970   benign  . CATARACT EXTRACTION, BILATERAL    . SKIN CANCER EXCISION     carcinoma (3 surgeries)  . TONSILLECTOMY  1948   Family History  Problem Relation Age of Onset  . Colon cancer Brother   . Colon polyps Sister   . Breast cancer Neg Hx    Social History   Socioeconomic History  . Marital status: Widowed    Spouse name: Not on file  . Number of children: 3  . Years of education: Not on file  . Highest education level: Not on file  Occupational History  . Not on file  Tobacco Use  . Smoking status: Never Smoker  . Smokeless tobacco: Never Used  Substance and Sexual Activity  . Alcohol use: Yes    Alcohol/week: 0.0 standard drinks     Comment: rare - glass of wine  . Drug use: No  . Sexual activity: Never  Other Topics Concern  . Not on file  Social History Narrative  . Not on file   Social Determinants of Health   Financial Resource Strain: Not on file  Food Insecurity: Not on file  Transportation Needs: Not on file  Physical Activity: Not on file  Stress: Not on file  Social Connections: Not on file    Outpatient Encounter Medications as of 08/09/2020  Medication Sig  . aspirin EC 81 MG tablet Take 81 mg by mouth daily.  Marland Kitchen atenolol (TENORMIN) 25 MG tablet Take 1 tablet (25 mg total) by mouth daily.  . busPIRone (BUSPAR) 5 MG tablet Take 1 tablet (5 mg total) by mouth daily as needed.  . Calcium Carbonate-Vitamin D 600-400 MG-UNIT per tablet Take 1 tablet by mouth daily.   . hydrocortisone (CORTEF) 10 MG tablet SWISH AND EXPECTORATE TWO TEASPOONFULS THREE TIMES A DAY.  Marland Kitchen lisinopril (ZESTRIL) 20 MG tablet Take 1 tablet (20 mg total) by mouth daily.  . Multiple Vitamins-Minerals (ICAPS PO) Take by mouth.  . mupirocin ointment (BACTROBAN) 2 % Apply to affected area twicd a day  . Niacin (VITAMIN B-3 PO) Take by mouth.  . timolol (BETIMOL) 0.5 % ophthalmic solution Place 1 drop into both  eyes 2 (two) times daily.  . valACYclovir (VALTREX) 1000 MG tablet Take 2 tablets twice a day for 1 day with each outbreak  . [DISCONTINUED] busPIRone (BUSPAR) 5 MG tablet Take 1 tablet (5 mg total) by mouth daily as needed.   No facility-administered encounter medications on file as of 08/09/2020.    Review of Systems  Constitutional: Negative for appetite change and unexpected weight change.  HENT: Negative for congestion and sinus pressure.   Respiratory: Negative for cough, chest tightness and shortness of breath.   Cardiovascular: Negative for chest pain, palpitations and leg swelling.  Gastrointestinal: Negative for abdominal pain, diarrhea, nausea and vomiting.  Genitourinary: Negative for difficulty urinating and  dysuria.  Musculoskeletal: Negative for joint swelling and myalgias.  Skin: Negative for color change and rash.  Neurological: Negative for dizziness, light-headedness and headaches.  Psychiatric/Behavioral: Negative for agitation and dysphoric mood.       Objective:    Physical Exam Vitals reviewed.  Constitutional:      General: She is not in acute distress.    Appearance: Normal appearance.  HENT:     Right Ear: External ear normal.     Left Ear: External ear normal.     Mouth/Throat:     Mouth: Oropharynx is clear and moist.  Eyes:     General: No scleral icterus.       Right eye: No discharge.        Left eye: No discharge.     Conjunctiva/sclera: Conjunctivae normal.  Neck:     Thyroid: No thyromegaly.  Cardiovascular:     Rate and Rhythm: Normal rate and regular rhythm.  Pulmonary:     Effort: No respiratory distress.     Breath sounds: Normal breath sounds. No wheezing.  Abdominal:     General: Bowel sounds are normal.     Palpations: Abdomen is soft.     Tenderness: There is no abdominal tenderness.  Musculoskeletal:        General: No swelling, tenderness or edema.     Cervical back: Neck supple. No tenderness.  Lymphadenopathy:     Cervical: No cervical adenopathy.  Skin:    Findings: No erythema or rash.  Neurological:     Mental Status: She is alert.  Psychiatric:        Mood and Affect: Mood normal.        Behavior: Behavior normal.     BP (!) 172/88   Pulse 94   Temp 98.1 F (36.7 C) (Oral)   Resp 16   Ht _0  (1.6 m)   Wt 115 lb (52.2 kg)   SpO2 97%   BMI 20.37 kg/m  Wt Readings from Last 3 Encounters:  08/09/20 115 lb (52.2 kg)  04/07/20 112 lb 12.8 oz (51.2 kg)  11/27/19 111 lb (50.3 kg)     Lab Results  Component Value Date   WBC 5.8 11/25/2019   HGB 14.3 11/25/2019   HCT 41.2 11/25/2019   PLT 210.0 11/25/2019   GLUCOSE 105 (H) 08/05/2020   CHOL 189 08/05/2020   TRIG 93.0 08/05/2020   HDL 64.80 08/05/2020   LDLCALC 106  (H) 08/05/2020   ALT 16 08/05/2020   AST 19 08/05/2020   NA 136 08/05/2020   K 3.8 08/05/2020   CL 100 08/05/2020   CREATININE 0.71 08/05/2020   BUN 13 08/05/2020   CO2 32 08/05/2020   TSH 6.76 (H) 08/05/2020   INR 0.9 06/05/2018   HGBA1C 5.8 08/05/2020  MM 3D SCREEN BREAST BILATERAL  Result Date: 01/09/2020 CLINICAL DATA:  Screening. EXAM: DIGITAL SCREENING BILATERAL MAMMOGRAM WITH TOMO AND CAD COMPARISON:  Previous exam(s). ACR Breast Density Category c: The breast tissue is heterogeneously dense, which may obscure small masses. FINDINGS: There are no findings suspicious for malignancy. Images were processed with CAD. IMPRESSION: No mammographic evidence of malignancy. A result letter of this screening mammogram will be mailed directly to the patient. RECOMMENDATION: Screening mammogram in one year. (Code:SM-B-01Y) BI-RADS CATEGORY  1: Negative. Electronically Signed   By: Evangeline Dakin M.D.   On: 01/09/2020 12:59       Assessment & Plan:   Problem List Items Addressed This Visit    Elevated hemoglobin ([place])    11/25/19 hgb 14/3.  Follow cbc.       Relevant Orders   CBC with Differential/Platelet   Elevated TSH    TSH slightly elevated.  Free T4 wnl.  Follow tsh and free T4. Recheck with next labs.       Relevant Orders   TSH   T4, free   Essential hypertension, benign    Blood pressure elevated here in the office.  Reviewed outside readings - 120-140/70-80s.  Currently on atenolol and lisinopril.  Discussed with her regarding needing better blood pressure control.  Outside checks are not significantly elevated like when in the office, but still need better control.  She declines.  Wants to follow pressures.  Will notify me if changes her mind.       Relevant Orders   Basic metabolic panel   Hypercholesteremia    Discussed calculated cholesterol risk and recommendation to start cholesterol medication.  She declines at this time.  Follow lipid panel.       Relevant  Orders   Lipid panel   Hepatic function panel   Hyperglycemia    Follow met b and a1c.       Relevant Orders   Hemoglobin A1c   Stress    Increased stress as outlined. Discussed. She does not feel needs any further intervention.  Follow.       Vitamin D deficiency    Last vitamin D level wnl.           Einar Pheasant, MD

## 2020-08-14 ENCOUNTER — Encounter: Payer: Self-pay | Admitting: Internal Medicine

## 2020-08-14 NOTE — Assessment & Plan Note (Signed)
Follow met b and a1c.  

## 2020-08-14 NOTE — Assessment & Plan Note (Signed)
Increased stress as outlined. Discussed. She does not feel needs any further intervention.  Follow.

## 2020-08-15 ENCOUNTER — Encounter: Payer: Self-pay | Admitting: Internal Medicine

## 2020-08-15 NOTE — Assessment & Plan Note (Signed)
Blood pressure elevated here in the office.  Reviewed outside readings - 120-140/70-80s.  Currently on atenolol and lisinopril.  Discussed with her regarding needing better blood pressure control.  Outside checks are not significantly elevated like when in the office, but still need better control.  She declines.  Wants to follow pressures.  Will notify me if changes her mind.

## 2020-08-15 NOTE — Assessment & Plan Note (Signed)
11/25/19 hgb 14/3.  Follow cbc.

## 2020-08-15 NOTE — Assessment & Plan Note (Signed)
TSH slightly elevated.  Free T4 wnl.  Follow tsh and free T4. Recheck with next labs.

## 2020-08-15 NOTE — Assessment & Plan Note (Signed)
Discussed calculated cholesterol risk and recommendation to start cholesterol medication.  She declines at this time.  Follow lipid panel.

## 2020-08-15 NOTE — Assessment & Plan Note (Signed)
Last vitamin D level wnl.  

## 2020-09-06 DIAGNOSIS — H40153 Residual stage of open-angle glaucoma, bilateral: Secondary | ICD-10-CM | POA: Diagnosis not present

## 2020-09-18 ENCOUNTER — Other Ambulatory Visit: Payer: Self-pay | Admitting: Internal Medicine

## 2020-10-12 ENCOUNTER — Ambulatory Visit (INDEPENDENT_AMBULATORY_CARE_PROVIDER_SITE_OTHER): Payer: PPO

## 2020-10-12 VITALS — Ht 63.0 in | Wt 115.0 lb

## 2020-10-12 DIAGNOSIS — Z1231 Encounter for screening mammogram for malignant neoplasm of breast: Secondary | ICD-10-CM

## 2020-10-12 DIAGNOSIS — Z0000 Encounter for general adult medical examination without abnormal findings: Secondary | ICD-10-CM | POA: Diagnosis not present

## 2020-10-12 NOTE — Progress Notes (Signed)
Subjective:   Claire Clayton is a 87 y.o. female who presents for Medicare Annual (Subsequent) preventive examination.  Review of Systems    No ROS.  Medicare Wellness Virtual Visit.    Cardiac Risk Factors include: advanced age (>58men, >22 women)     Objective:    Today's Vitals   10/12/20 0947  Weight: 115 lb (52.2 kg)  Height: 5\' 3"  (1.6 m)   Body mass index is 20.37 kg/m.  Advanced Directives 10/12/2020 05/30/2017  Does Patient Have a Medical Advance Directive? Yes Yes  Type of 14/05/2017 of [place];Living will Living will;Healthcare Power of Attorney  Does patient want to make changes to medical advance directive? No - Patient declined No - Patient declined  Copy of Healthcare Power of Attorney in Chart? No - copy requested No - copy requested    Current Medications (verified) Outpatient Encounter Medications as of 10/12/2020  Medication Sig  . aspirin EC 81 MG tablet Take 81 mg by mouth daily.  10/14/2020 atenolol (TENORMIN) 25 MG tablet Take 1 tablet (25 mg total) by mouth daily.  . busPIRone (BUSPAR) 5 MG tablet Take 1 tablet (5 mg total) by mouth daily as needed.  . Calcium Carbonate-Vitamin D 600-400 MG-UNIT per tablet Take 1 tablet by mouth daily.   . hydrocortisone (CORTEF) 10 MG tablet SWISH AND EXPECTORATE TWO TEASPOONFULS THREE TIMES A DAY.  Marland Kitchen lisinopril (ZESTRIL) 20 MG tablet Take 1 tablet (20 mg total) by mouth daily.  . Multiple Vitamins-Minerals (ICAPS PO) Take by mouth.  . mupirocin ointment (BACTROBAN) 2 % Apply to affected area twicd a day  . Niacin (VITAMIN B-3 PO) Take by mouth.  . timolol (BETIMOL) 0.5 % ophthalmic solution Place 1 drop into both eyes 2 (two) times daily.  . valACYclovir (VALTREX) 1000 MG tablet Take 2 tablets twice a day for 1 day with each outbreak   No facility-administered encounter medications on file as of 10/12/2020.    Allergies (verified) Scopolamine, Penicillins, and Sulfa antibiotics    History: Past Medical History:  Diagnosis Date  . Cancer (HCC)    squamous cell skin  . Diverticulosis   . Glaucoma   . Hypertension   . Osteoporosis   . Seasonal allergies    Past Surgical History:  Procedure Laterality Date  . BREAST EXCISIONAL BIOPSY Left 1970   benign  . CATARACT EXTRACTION, BILATERAL    . SKIN CANCER EXCISION     carcinoma (3 surgeries)  . TONSILLECTOMY  1948   Family History  Problem Relation Age of Onset  . Colon cancer Brother   . Colon polyps Sister   . Breast cancer Neg Hx    Social History   Socioeconomic History  . Marital status: Widowed    Spouse name: Not on file  . Number of children: 3  . Years of education: Not on file  . Highest education level: Not on file  Occupational History  . Not on file  Tobacco Use  . Smoking status: Never Smoker  . Smokeless tobacco: Never Used  Substance and Sexual Activity  . Alcohol use: Yes    Alcohol/week: 0.0 standard drinks    Comment: rare - glass of wine  . Drug use: No  . Sexual activity: Never  Other Topics Concern  . Not on file  Social History Narrative  . Not on file   Social Determinants of Health   Financial Resource Strain: Low Risk   . Difficulty of Paying Living Expenses:  Not hard at all  Food Insecurity: No Food Insecurity  . Worried About Charity fundraiser in the Last Year: Never true  . Ran Out of Food in the Last Year: Never true  Transportation Needs: No Transportation Needs  . Lack of Transportation (Medical): No  . Lack of Transportation (Non-Medical): No  Physical Activity: Not on file  Stress: No Stress Concern Present  . Feeling of Stress : Not at all  Social Connections: Unknown  . Frequency of Communication with Friends and Family: More than three times a week  . Frequency of Social Gatherings with Friends and Family: More than three times a week  . Attends Religious Services: More than 4 times per year  . Active Member of Clubs or Organizations: Not on  file  . Attends Archivist Meetings: Not on file  . Marital Status: Not on file    Tobacco Counseling Counseling given: Not Answered   Clinical Intake:  Pre-visit preparation completed: Yes        Diabetes: No  How often do you need to have someone help you when you read instructions, pamphlets, or other written materials from your doctor or pharmacy?: 1 - Never    Interpreter Needed?: No      Activities of Daily Living In your present state of health, do you have any difficulty performing the following activities: 10/12/2020  Hearing? N  Vision? N  Difficulty concentrating or making decisions? N  Walking or climbing stairs? N  Dressing or bathing? N  Doing errands, shopping? N  Preparing Food and eating ? N  Using the Toilet? N  In the past six months, have you accidently leaked urine? N  Do you have problems with loss of bowel control? N  Managing your Medications? N  Managing your Finances? N  Housekeeping or managing your Housekeeping? N  Some recent data might be hidden    Patient Care Team: Einar Pheasant, MD as PCP - General (Internal Medicine)  Indicate any recent Medical Services you may have received from other than [facility] providers in the past year (date may be approximate).     Assessment:   This is a routine wellness examination for [place].  I connected with [name] today by telephone and verified that I am speaking with the correct person using two identifiers. Location patient: home Location provider: work Persons participating in the virtual visit: patient, Marine scientist.    I discussed the limitations, risks, security and privacy concerns of performing an evaluation and management service by telephone and the availability of in person appointments. The patient expressed understanding and verbally consented to this telephonic visit.    Interactive audio and video telecommunications were attempted between this provider and patient, however  failed, due to patient having technical difficulties OR patient did not have access to video capability.  We continued and completed visit with audio only.  Hearing/Vision screen  Hearing Screening   125Hz  250Hz  500Hz  1000Hz  2000Hz  3000Hz  4000Hz  6000Hz  8000Hz   Right ear:           Left ear:           Comments: Patient is able to hear conversational tones without difficulty.  No issues reported.   Vision Screening Comments: Wears lenses when reading  Visits every 4 months Glaucoma; drops in use  Cataract extraction, bilateral  Virtual visit   Dietary issues and exercise activities discussed: Current Exercise Habits: Home exercise routine, Type of exercise: calisthenics;walking, Intensity: Mild  Regular diet Good  water intake  Goals    . Healthy Lifestyle     Stay hydrated Low carb diet Stay active      Depression Screen PHQ 2/9 Scores 10/12/2020 08/09/2020 03/03/2019 06/01/2017 05/30/2017 11/21/2016 11/21/2016  PHQ - 2 Score 0 0 0 0 0 1 0  PHQ- 9 Score - - - - - 2 -    Fall Risk Fall Risk  10/12/2020 10/22/2019 07/03/2019 06/01/2017 05/30/2017  Falls in the past year? 0 0 0 No No  Number falls in past yr: 0 0 - - -  Injury with Fall? 0 0 - - -  Risk for fall due to : - No Fall Risks - - -  Risk for fall due to: Comment - - - - -  Follow up Falls evaluation completed Falls evaluation completed Falls evaluation completed - -    FALL RISK PREVENTION PERTAINING TO THE HOME: Handrails in use when climbing stairs? Yes Home free of loose throw rugs in walkways, pet beds, electrical cords, etc? Yes  Adequate lighting in your home to reduce risk of falls? Yes   ASSISTIVE DEVICES UTILIZED TO PREVENT FALLS: Use of a cane, walker or w/c? No   TIMED UP AND GO: Was the test performed? No . Virtual visit.   Cognitive Function:  Patient is alert and oriented x3. Denies difficulty focusing, making decisions, memory loss.  Enjoys reading and other brain health activities.  MMSE/6CIT  deferred. Normal by direct communication/observation.    6CIT Screen 05/30/2017  What Year? 0 points  What month? 0 points  What time? 0 points  Count back from 20 0 points  Months in reverse 0 points  Repeat phrase 0 points  Total Score 0    Immunizations Immunization History  Administered Date(s) Administered  . Influenza Split 03/06/2014  . Influenza,inj,Quad PF,6+ Mos 03/25/2013, 05/17/2015  . Influenza,inj,quad, With Preservative 03/17/2019  . Influenza-Unspecified 05/12/2016, 04/23/2018, 03/15/2020  . PFIZER(Purple Top)SARS-COV-2 Vaccination 07/16/2019, 08/06/2019, 07/20/2020  . Pneumococcal Conjugate-13 01/29/2017  . Pneumococcal Polysaccharide-23 06/05/2018    TDAP status: Due, Education has been provided regarding the importance of this vaccine. Advised may receive this vaccine at local pharmacy or Health Dept. Aware to provide a copy of the vaccination record if obtained from local pharmacy or Health Dept. Verbalized acceptance and understanding. Deferred.   Health Maintenance Health Maintenance  Topic Date Due  . TETANUS/TDAP  12/08/2020 (Originally 01/23/1951)  . MAMMOGRAM  01/07/2021  . INFLUENZA VACCINE  01/17/2021  . COVID-19 Vaccine (4 - Booster for Pfizer series) 01/17/2021  . DEXA SCAN  Completed  . PNA vac Low Risk Adult  Completed  . HPV VACCINES  Aged Out   Colorectal cancer screening: No longer required.   Mammogram status: Completed 01/08/20. Repeat every year. Ordered.  Lung Cancer Screening: (Low Dose CT Chest recommended if Age 77-80 years, 30 pack-year currently smoking OR have quit w/in 15years.) does not qualify.   Vision Screening: Recommended annual ophthalmology exams for early detection of glaucoma and other disorders of the eye. Is the patient up to date with their annual eye exam?  Yes  Who is the provider or what is the name of the office in which the patient attends annual eye exams? Dr. Gloriann Loan  Dental Screening: Recommended annual dental  exams for proper oral hygiene.  Community Resource Referral / Chronic Care Management: CRR required this visit?  No   CCM required this visit?  No      Plan:   Keep all routine  maintenance appointments.   Next scheduled lab 12/03/20 @ 9:15  Follow up 12/08/20 @ 10:00  I have personally reviewed and noted the following in the patient's chart:   . Medical and social history . Use of alcohol, tobacco or illicit drugs  . Current medications and supplements . Functional ability and status . Nutritional status . Physical activity . Advanced directives . List of other physicians . Hospitalizations, surgeries, and ER visits in previous 12 months . Vitals . Screenings to include cognitive, depression, and falls . Referrals and appointments  In addition, I have reviewed and discussed with patient certain preventive protocols, quality metrics, and best practice recommendations. A written personalized care plan for preventive services as well as general preventive health recommendations were provided to patient via mychart.     Varney Biles, LPN   [id number]

## 2020-10-12 NOTE — Patient Instructions (Addendum)
[name] , Thank you for taking time to come for your Medicare Wellness Visit. I appreciate your ongoing commitment to your health goals. Please review the following plan we discussed and let me know if I can assist you in the future.   These are the goals we discussed: Goals    . Healthy Lifestyle     Stay hydrated Low carb diet Stay active       This is a list of the screening recommended for you and due dates:  Health Maintenance  Topic Date Due  . Tetanus Vaccine  12/08/2020*  . Mammogram  01/07/2021  . Flu Shot  01/17/2021  . COVID-19 Vaccine (4 - Booster for Pfizer series) 01/17/2021  . DEXA scan (bone density measurement)  Completed  . Pneumonia vaccines  Completed  . HPV Vaccine  Aged Out  *Topic was postponed. The date shown is not the original due date.   Immunizations Immunization History  Administered Date(s) Administered  . Influenza Split 03/06/2014  . Influenza,inj,Quad PF,6+ Mos 03/25/2013, 05/17/2015  . Influenza,inj,quad, With Preservative 03/17/2019  . Influenza-Unspecified 05/12/2016, 04/23/2018, 03/15/2020  . PFIZER(Purple Top)SARS-COV-2 Vaccination 07/16/2019, 08/06/2019, 07/20/2020  . Pneumococcal Conjugate-13 01/29/2017  . Pneumococcal Polysaccharide-23 06/05/2018   Keep all routine maintenance appointments.   Next scheduled lab 12/03/20 @ 9:15  Follow up 12/08/20 @ 10:00  Advanced directives: End of life planning; Advance aging; Advanced directives discussed.  Copy of current HCPOA/Living Will requested.    Conditions/risks identified: none new  Follow up in one year for your annual wellness visit    Preventive Care 65 Years and Older, Female Preventive care refers to lifestyle choices and visits with your health care provider that can promote health and wellness. What does preventive care include?  A yearly physical exam. This is also called an annual well check.  Dental exams once or twice a year.  Routine eye exams. Ask your  health care provider how often you should have your eyes checked.  Personal lifestyle choices, including:  Daily care of your teeth and gums.  Regular physical activity.  Eating a healthy diet.  Avoiding tobacco and drug use.  Limiting alcohol use.  Practicing safe sex.  Taking low-dose aspirin every day.  Taking vitamin and mineral supplements as recommended by your health care provider. What happens during an annual well check? The services and screenings done by your health care provider during your annual well check will depend on your age, overall health, lifestyle risk factors, and family history of disease. Counseling  Your health care provider may ask you questions about your:  Alcohol use.  Tobacco use.  Drug use.  Emotional well-being.  Home and relationship well-being.  Sexual activity.  Eating habits.  History of falls.  Memory and ability to understand (cognition).  Work and work Statistician.  Reproductive health. Screening  You may have the following tests or measurements:  Height, weight, and BMI.  Blood pressure.  Lipid and cholesterol levels. These may be checked every 5 years, or more frequently if you are over 85 years old.  Skin check.  Lung cancer screening. You may have this screening every year starting at age 75 if you have a 30-pack-year history of smoking and currently smoke or have quit within the past 15 years.  Fecal occult blood test (FOBT) of the stool. You may have this test every year starting at age 72.  Flexible sigmoidoscopy or colonoscopy. You may have a sigmoidoscopy every 5 years or a colonoscopy every  10 years starting at age 88.  Hepatitis C blood test.  Hepatitis B blood test.  Sexually transmitted disease (STD) testing.  Diabetes screening. This is done by checking your blood sugar (glucose) after you have not eaten for a while (fasting). You may have this done every 1-3 years.  Bone density scan. This is  done to screen for osteoporosis. You may have this done starting at age 86.  Mammogram. This may be done every 1-2 years. Talk to your health care provider about how often you should have regular mammograms. Talk with your health care provider about your test results, treatment options, and if necessary, the need for more tests. Vaccines  Your health care provider may recommend certain vaccines, such as:  Influenza vaccine. This is recommended every year.  Tetanus, diphtheria, and acellular pertussis (Tdap, Td) vaccine. You may need a Td booster every 10 years.  Zoster vaccine. You may need this after age 38.  Pneumococcal 13-valent conjugate (PCV13) vaccine. One dose is recommended after age 72.  Pneumococcal polysaccharide (PPSV23) vaccine. One dose is recommended after age 37. Talk to your health care provider about which screenings and vaccines you need and how often you need them. This information is not intended to replace advice given to you by your health care provider. Make sure you discuss any questions you have with your health care provider. Document Released: 07/02/2015 Document Revised: 02/23/2016 Document Reviewed: 04/06/2015 Elsevier Interactive Patient Education  2017 [place] Prevention in the Home Falls can cause injuries. They can happen to people of all ages. There are many things you can do to make your home safe and to help prevent falls. What can I do on the outside of my home?  Regularly fix the edges of walkways and driveways and fix any cracks.  Remove anything that might make you trip as you walk through a door, such as a raised step or threshold.  Trim any bushes or trees on the path to your home.  Use bright outdoor lighting.  Clear any walking paths of anything that might make someone trip, such as rocks or tools.  Regularly check to see if handrails are loose or broken. Make sure that both sides of any steps have handrails.  Any raised  decks and porches should have guardrails on the edges.  Have any leaves, snow, or ice cleared regularly.  Use sand or salt on walking paths during winter.  Clean up any spills in your garage right away. This includes oil or grease spills. What can I do in the bathroom?  Use night lights.  Install grab bars by the toilet and in the tub and shower. Do not use towel bars as grab bars.  Use non-skid mats or decals in the tub or shower.  If you need to sit down in the shower, use a plastic, non-slip stool.  Keep the floor dry. Clean up any water that spills on the floor as soon as it happens.  Remove soap buildup in the tub or shower regularly.  Attach bath mats securely with double-sided non-slip rug tape.  Do not have throw rugs and other things on the floor that can make you trip. What can I do in the bedroom?  Use night lights.  Make sure that you have a light by your bed that is easy to reach.  Do not use any sheets or blankets that are too big for your bed. They should not hang down onto the floor.  Have a firm chair that has side arms. You can use this for support while you get dressed.  Do not have throw rugs and other things on the floor that can make you trip. What can I do in the kitchen?  Clean up any spills right away.  Avoid walking on wet floors.  Keep items that you use a lot in easy-to-reach places.  If you need to reach something above you, use a strong step stool that has a grab bar.  Keep electrical cords out of the way.  Do not use floor polish or wax that makes floors slippery. If you must use wax, use non-skid floor wax.  Do not have throw rugs and other things on the floor that can make you trip. What can I do with my stairs?  Do not leave any items on the stairs.  Make sure that there are handrails on both sides of the stairs and use them. Fix handrails that are broken or loose. Make sure that handrails are as long as the stairways.  Check  any carpeting to make sure that it is firmly attached to the stairs. Fix any carpet that is loose or worn.  Avoid having throw rugs at the top or bottom of the stairs. If you do have throw rugs, attach them to the floor with carpet tape.  Make sure that you have a light switch at the top of the stairs and the bottom of the stairs. If you do not have them, ask someone to add them for you. What else can I do to help prevent falls?  Wear shoes that:  Do not have high heels.  Have rubber bottoms.  Are comfortable and fit you well.  Are closed at the toe. Do not wear sandals.  If you use a stepladder:  Make sure that it is fully opened. Do not climb a closed stepladder.  Make sure that both sides of the stepladder are locked into place.  Ask someone to hold it for you, if possible.  Clearly mark and make sure that you can see:  Any grab bars or handrails.  First and last steps.  Where the edge of each step is.  Use tools that help you move around (mobility aids) if they are needed. These include:  Canes.  Walkers.  Scooters.  Crutches.  Turn on the lights when you go into a dark area. Replace any light bulbs as soon as they burn out.  Set up your furniture so you have a clear path. Avoid moving your furniture around.  If any of your floors are uneven, fix them.  If there are any pets around you, be aware of where they are.  Review your medicines with your doctor. Some medicines can make you feel dizzy. This can increase your chance of falling. Ask your doctor what other things that you can do to help prevent falls. This information is not intended to replace advice given to you by your health care provider. Make sure you discuss any questions you have with your health care provider. Document Released: 04/01/2009 Document Revised: 11/11/2015 Document Reviewed: 07/10/2014 Elsevier Interactive Patient Education  2017 [place] A mammogram is a low  energy X-ray of the breasts that is done to check for abnormal changes. This procedure can screen for and detect any changes that may indicate breast cancer. Mammograms are regularly done on women. A man may have a mammogram if he has a lump or swelling in his  breast. A mammogram can also identify other changes and variations in the breast, such as:  Inflammation of the breast tissue (mastitis).  An infected area that contains a collection of pus (abscess).  A fluid-filled sac (cyst).  Fibrocystic changes. This is when breast tissue becomes denser, which can make the tissue feel rope-like or uneven under the skin.  Tumors that are not cancerous (benign). Tell a health care provider:  About any allergies you have.  If you have breast implants.  If you have had previous breast disease, biopsy, or surgery.  If you are breastfeeding.  If you are younger than age 67.  If you have a family history of breast cancer.  Whether you are pregnant or may be pregnant. What are the risks? Generally, this is a safe procedure. However, problems may occur, including:  Exposure to radiation. Radiation levels are very low with this test.  The results being misinterpreted.  The need for further tests.  The inability of the mammogram to detect certain cancers. What happens before the procedure?  Schedule your test about 1-2 weeks after your menstrual period if you are still menstruating. This is usually when your breasts are the least tender.  If you have had a mammogram done at a different facility in the past, get the mammogram X-rays or have them sent to your current exam facility. The new and old images will be compared.  Wash your breasts and underarms on the day of the test.  Do not wear deodorants, perfumes, lotions, or powders anywhere on your body on the day of the test.  Remove any jewelry from your neck.  Wear clothes that you can change into and out of easily. What happens  during the procedure?  You will undress from the waist up and put on a gown that opens in the front.  You will stand in front of the X-ray machine.  Each breast will be placed between two plastic or glass plates. The plates will compress your breast for a few seconds. Try to stay as relaxed as possible during the procedure. This does not cause any harm to your breasts and any discomfort you feel will be very brief.  X-rays will be taken from different angles of each breast. The procedure may vary among health care providers and hospitals.   What happens after the procedure?  The mammogram will be examined by a specialist (radiologist).  You may need to repeat certain parts of the test, depending on the quality of the images. This is commonly done if the radiologist needs a better view of the breast tissue.  You may resume your normal activities.  It is up to you to get the results of your procedure. Ask your health care provider, or the department that is doing the procedure, when your results will be ready. Summary  A mammogram is a low energy X-ray of the breasts that is done to check for abnormal changes. A man may have a mammogram if he has a lump or swelling in his breast.  If you have had a mammogram done at a different facility in the past, get the mammogram X-rays or have them sent to your current exam facility in order to compare them.  Schedule your test about 1-2 weeks after your menstrual period if you are still menstruating.  For this test, each breast will be placed between two plastic or glass plates. The plates will compress your breast for a few seconds.  Ask when your  test results will be ready. Make sure you get your test results. This information is not intended to replace advice given to you by your health care provider. Make sure you discuss any questions you have with your health care provider. Document Revised: 01/24/2018 Document Reviewed: 01/24/2018 Elsevier  Patient Education  [place].

## 2020-11-04 ENCOUNTER — Other Ambulatory Visit: Payer: Self-pay | Admitting: Internal Medicine

## 2020-12-03 ENCOUNTER — Other Ambulatory Visit: Payer: Self-pay

## 2020-12-03 ENCOUNTER — Other Ambulatory Visit (INDEPENDENT_AMBULATORY_CARE_PROVIDER_SITE_OTHER): Payer: PPO

## 2020-12-03 DIAGNOSIS — I10 Essential (primary) hypertension: Secondary | ICD-10-CM | POA: Diagnosis not present

## 2020-12-03 DIAGNOSIS — R7989 Other specified abnormal findings of blood chemistry: Secondary | ICD-10-CM | POA: Diagnosis not present

## 2020-12-03 DIAGNOSIS — E7800 Pure hypercholesterolemia, unspecified: Secondary | ICD-10-CM

## 2020-12-03 DIAGNOSIS — D582 Other hemoglobinopathies: Secondary | ICD-10-CM | POA: Diagnosis not present

## 2020-12-03 DIAGNOSIS — R739 Hyperglycemia, unspecified: Secondary | ICD-10-CM

## 2020-12-03 LAB — CBC WITH DIFFERENTIAL/PLATELET
Basophils Absolute: 0 10*3/uL (ref 0.0–0.1)
Basophils Relative: 0.6 % (ref 0.0–3.0)
Eosinophils Absolute: 0.2 10*3/uL (ref 0.0–0.7)
Eosinophils Relative: 3.4 % (ref 0.0–5.0)
HCT: 41.9 % (ref 36.0–46.0)
Hemoglobin: 14.8 g/dL (ref 12.0–15.0)
Lymphocytes Relative: 31.3 % (ref 12.0–46.0)
Lymphs Abs: 2.1 10*3/uL (ref 0.7–4.0)
MCHC: 35.4 g/dL (ref 30.0–36.0)
MCV: 93.8 fl (ref 78.0–100.0)
Monocytes Absolute: 0.7 10*3/uL (ref 0.1–1.0)
Monocytes Relative: 10.1 % (ref 3.0–12.0)
Neutro Abs: 3.6 10*3/uL (ref 1.4–7.7)
Neutrophils Relative %: 54.6 % (ref 43.0–77.0)
Platelets: 217 10*3/uL (ref 150.0–400.0)
RBC: 4.47 Mil/uL (ref 3.87–5.11)
RDW: 12.8 % (ref 11.5–15.5)
WBC: 6.6 10*3/uL (ref 4.0–10.5)

## 2020-12-03 LAB — HEPATIC FUNCTION PANEL
ALT: 14 U/L (ref 0–35)
AST: 18 U/L (ref 0–37)
Albumin: 4.3 g/dL (ref 3.5–5.2)
Alkaline Phosphatase: 86 U/L (ref 39–117)
Bilirubin, Direct: 0.1 mg/dL (ref 0.0–0.3)
Total Bilirubin: 0.6 mg/dL (ref 0.2–1.2)
Total Protein: 6.5 g/dL (ref 6.0–8.3)

## 2020-12-03 LAB — LIPID PANEL
Cholesterol: 182 mg/dL (ref 0–200)
HDL: 62.5 mg/dL (ref >39.00)
LDL Cholesterol: 100 mg/dL — ABNORMAL HIGH (ref 0–99)
NonHDL: 119.3
Total CHOL/HDL Ratio: 3
Triglycerides: 95 mg/dL (ref 0.0–149.0)
VLDL: 19 mg/dL (ref 0.0–40.0)

## 2020-12-03 LAB — BASIC METABOLIC PANEL
BUN: 15 mg/dL (ref 6–23)
CO2: 27 mEq/L (ref 19–32)
Calcium: 9.3 mg/dL (ref 8.4–10.5)
Chloride: 101 mEq/L (ref 96–112)
Creatinine, Ser: 0.73 mg/dL (ref 0.40–1.20)
GFR: 74.24 mL/min (ref >60.00)
Glucose, Bld: 110 mg/dL — ABNORMAL HIGH (ref 70–99)
Potassium: 4.3 mEq/L (ref 3.5–5.1)
Sodium: 137 mEq/L (ref 135–145)

## 2020-12-03 LAB — TSH: TSH: 6.51 u[IU]/mL — ABNORMAL HIGH (ref 0.35–4.50)

## 2020-12-03 LAB — T4, FREE: Free T4: 0.9 ng/dL (ref 0.60–1.60)

## 2020-12-03 LAB — HEMOGLOBIN A1C: Hgb A1c MFr Bld: 5.9 % (ref 4.6–6.5)

## 2020-12-08 ENCOUNTER — Encounter: Payer: Self-pay | Admitting: Internal Medicine

## 2020-12-08 ENCOUNTER — Ambulatory Visit (INDEPENDENT_AMBULATORY_CARE_PROVIDER_SITE_OTHER): Payer: PPO | Admitting: Internal Medicine

## 2020-12-08 ENCOUNTER — Other Ambulatory Visit: Payer: Self-pay

## 2020-12-08 VITALS — BP 178/88 | HR 90 | Temp 97.1°F | Resp 16 | Ht 63.0 in | Wt 113.2 lb

## 2020-12-08 DIAGNOSIS — M810 Age-related osteoporosis without current pathological fracture: Secondary | ICD-10-CM | POA: Diagnosis not present

## 2020-12-08 DIAGNOSIS — D582 Other hemoglobinopathies: Secondary | ICD-10-CM | POA: Diagnosis not present

## 2020-12-08 DIAGNOSIS — R739 Hyperglycemia, unspecified: Secondary | ICD-10-CM

## 2020-12-08 DIAGNOSIS — Z0000 Encounter for general adult medical examination without abnormal findings: Secondary | ICD-10-CM

## 2020-12-08 DIAGNOSIS — F439 Reaction to severe stress, unspecified: Secondary | ICD-10-CM

## 2020-12-08 DIAGNOSIS — E7800 Pure hypercholesterolemia, unspecified: Secondary | ICD-10-CM

## 2020-12-08 DIAGNOSIS — Z1231 Encounter for screening mammogram for malignant neoplasm of breast: Secondary | ICD-10-CM

## 2020-12-08 DIAGNOSIS — I10 Essential (primary) hypertension: Secondary | ICD-10-CM

## 2020-12-08 DIAGNOSIS — E2839 Other primary ovarian failure: Secondary | ICD-10-CM

## 2020-12-08 MED ORDER — AMLODIPINE BESYLATE 2.5 MG PO TABS: 2.5000 mg | ORAL_TABLET | Freq: Every day | ORAL | 3 refills | Status: DC

## 2020-12-08 NOTE — Assessment & Plan Note (Signed)
Physical today 12/08/20.  colonosocpy 05/2009.  Mammogram 01/09/20 - Birads I.

## 2020-12-08 NOTE — Progress Notes (Signed)
Patient ID: Claire Clayton, female   DOB: 11-28-1931, 87 y.o.   MRN: [id number]   Subjective:    Patient ID: Claire Clayton, female    DOB: 09/11/1931, 87 y.o.   MRN: [id number]  HPI This visit occurred during the SARS-CoV-2 public health emergency.  Safety protocols were in place, including screening questions prior to the visit, additional usage of staff PPE, and extensive cleaning of exam room while observing appropriate contact time as indicated for disinfecting solutions.   Patient here for her physical exam.  Increased stress with her granddaughter's health issues.  Discussed.  Overall she feels she is handling things relatively well.  Does not feel needs any further intervention.  Stays active.  No chest pain.  No sob.  No acid reflux.  No abdominal pain.  Bowels moving.  Mammogram due next month.  She will schedule.  Has glaucoma.  Followed by ophthalmology.  Increased gas.  Discussed increased fiber.     Past Medical History:  Diagnosis Date   Cancer ([place])    squamous cell skin   Diverticulosis    Glaucoma    Hypertension    Osteoporosis    Seasonal allergies    Past Surgical History:  Procedure Laterality Date   BREAST EXCISIONAL BIOPSY Left 1970   benign   CATARACT EXTRACTION, BILATERAL     SKIN CANCER EXCISION     carcinoma (3 surgeries)   TONSILLECTOMY  1948   Family History  Problem Relation Age of Onset   Colon cancer Brother    Colon polyps Sister    Breast cancer Neg Hx    Social History   Socioeconomic History   Marital status: Widowed    Spouse name: Not on file   Number of children: 3   Years of education: Not on file   Highest education level: Not on file  Occupational History   Not on file  Tobacco Use   Smoking status: Never   Smokeless tobacco: Never  Substance and Sexual Activity   Alcohol use: Yes    Alcohol/week: 0.0 standard drinks    Comment: rare - glass of wine   Drug use: No   Sexual activity: Never  Other Topics Concern    Not on file  Social History Narrative   Not on file   Social Determinants of Health   Financial Resource Strain: Low Risk    Difficulty of Paying Living Expenses: Not hard at all  Food Insecurity: No Food Insecurity   Worried About Charity fundraiser in the Last Year: Never true   [place] in the Last Year: Never true  Transportation Needs: No Transportation Needs   Lack of Transportation (Medical): No   Lack of Transportation (Non-Medical): No  Physical Activity: Not on file  Stress: No Stress Concern Present   Feeling of Stress : Not at all  Social Connections: Unknown   Frequency of Communication with Friends and Family: More than three times a week   Frequency of Social Gatherings with Friends and Family: More than three times a week   Attends Religious Services: More than 4 times per year   Active Member of [employer] or Organizations: Not on file   Attends Archivist Meetings: Not on file   Marital Status: Not on file    Outpatient Encounter Medications as of 12/08/2020  Medication Sig   amLODipine (NORVASC) 2.5 MG tablet Take 1 tablet (2.5 mg total) by mouth daily.   Travoprost, BAK  Free, (TRAVATAN) 0.004 % SOLN ophthalmic solution 1 drop at bedtime.   aspirin EC 81 MG tablet Take 81 mg by mouth daily.   atenolol (TENORMIN) 25 MG tablet Take 1 tablet (25 mg total) by mouth daily.   busPIRone (BUSPAR) 5 MG tablet Take 1 tablet (5 mg total) by mouth daily as needed.   Calcium Carbonate-Vitamin D 600-400 MG-UNIT per tablet Take 1 tablet by mouth daily.    hydrocortisone (CORTEF) 10 MG tablet SWISH AND EXPECTORATE TWO TEASPOONFULS THREE TIMES A DAY.   lisinopril (ZESTRIL) 20 MG tablet Take 1 tablet (20 mg total) by mouth daily.   Multiple Vitamins-Minerals (ICAPS PO) Take by mouth.   mupirocin ointment (BACTROBAN) 2 % Apply to affected area twicd a day   Niacin (VITAMIN B-3 PO) Take by mouth.   timolol (BETIMOL) 0.5 % ophthalmic solution Place 1 drop into both  eyes 2 (two) times daily.   valACYclovir (VALTREX) 1000 MG tablet Take 2 tablets twice a day for 1 day with each outbreak   No facility-administered encounter medications on file as of 12/08/2020.     Review of Systems  Constitutional:  Negative for fatigue and unexpected weight change.  HENT:  Negative for congestion and sinus pressure.   Respiratory:  Negative for cough, chest tightness and shortness of breath.   Cardiovascular:  Negative for chest pain, palpitations and leg swelling.  Gastrointestinal:  Positive for blood in stool. Negative for abdominal pain, diarrhea, nausea and vomiting.  Genitourinary:  Negative for difficulty urinating and dysuria.  Musculoskeletal:  Negative for joint swelling and myalgias.  Skin:  Negative for color change and rash.  Neurological:  Negative for dizziness, light-headedness and headaches.  Psychiatric/Behavioral:  Positive for dysphoric mood. Negative for agitation.        Increased stress as outlined.        Objective:    Physical Exam Vitals reviewed.  Constitutional:      General: She is not in acute distress.    Appearance: Normal appearance.  HENT:     Head: Normocephalic and atraumatic.     Right Ear: External ear normal.     Left Ear: External ear normal.  Eyes:     General: No scleral icterus.       Right eye: No discharge.        Left eye: No discharge.     Conjunctiva/sclera: Conjunctivae normal.  Neck:     Thyroid: No thyromegaly.  Cardiovascular:     Rate and Rhythm: Normal rate and regular rhythm.  Pulmonary:     Effort: No respiratory distress.     Breath sounds: Normal breath sounds. No wheezing.  Abdominal:     General: Bowel sounds are normal.     Palpations: Abdomen is soft.     Tenderness: There is no abdominal tenderness.  Musculoskeletal:        General: No swelling or tenderness.     Cervical back: Neck supple. No tenderness.  Lymphadenopathy:     Cervical: No cervical adenopathy.  Skin:    Findings:  No erythema or rash.  Neurological:     Mental Status: She is alert.  Psychiatric:        Mood and Affect: Mood normal.        Behavior: Behavior normal.    BP (!) 178/88   Pulse 90   Temp (!) 97.1 F (36.2 C)   Resp 16   Ht $R'5\' 3"'bt$  (1.6 m)   Wt 113 lb 3.2  oz (51.3 kg)   SpO2 98%   BMI 20.05 kg/m  Wt Readings from Last 3 Encounters:  12/08/20 113 lb 3.2 oz (51.3 kg)  10/12/20 115 lb (52.2 kg)  08/09/20 115 lb (52.2 kg)     Lab Results  Component Value Date   WBC 6.6 12/03/2020   HGB 14.8 12/03/2020   HCT 41.9 12/03/2020   PLT 217.0 12/03/2020   GLUCOSE 110 (H) 12/03/2020   CHOL 182 12/03/2020   TRIG 95.0 12/03/2020   HDL 62.50 12/03/2020   LDLCALC 100 (H) 12/03/2020   ALT 14 12/03/2020   AST 18 12/03/2020   NA 137 12/03/2020   K 4.3 12/03/2020   CL 101 12/03/2020   CREATININE 0.73 12/03/2020   BUN 15 12/03/2020   CO2 27 12/03/2020   TSH 6.51 (H) 12/03/2020   INR 0.9 06/05/2018   HGBA1C 5.9 12/03/2020    MM 3D SCREEN BREAST BILATERAL  Result Date: 01/09/2020 CLINICAL DATA:  Screening. EXAM: DIGITAL SCREENING BILATERAL MAMMOGRAM WITH TOMO AND CAD COMPARISON:  Previous exam(s). ACR Breast Density Category c: The breast tissue is heterogeneously dense, which may obscure small masses. FINDINGS: There are no findings suspicious for malignancy. Images were processed with CAD. IMPRESSION: No mammographic evidence of malignancy. A result letter of this screening mammogram will be mailed directly to the patient. RECOMMENDATION: Screening mammogram in one year. (Code:SM-B-01Y) BI-RADS CATEGORY  1: Negative. Electronically Signed   By: Evangeline Dakin M.D.   On: 01/09/2020 12:59       Assessment & Plan:   Problem List Items Addressed This Visit     Elevated hemoglobin ([place])    Follow cbc.        Essential hypertension, benign    Blood pressure elevated here in the office.  Reviewed outside readings - 120-130/70's.  Continue atenolol and lisinopril.  Follow  pressures.  Follow metabolic panel.        Relevant Medications   amLODipine (NORVASC) 2.5 MG tablet   Health care maintenance    Physical today 12/08/20.  colonosocpy 05/2009.  Mammogram 01/09/20 - Birads I.        Hypercholesteremia    Follow met b and a1c.        Relevant Medications   amLODipine (NORVASC) 2.5 MG tablet   Hyperglycemia    Follow met b and a1c.        Osteoporosis    Continue calcium, vitamin D and weight bearing exercise.         Stress    Increased stress as outlined.  Discussed.  Does not feel needs any further intervention.  Follow.         Other Visit Diagnoses     Visit for screening mammogram    -  Primary   Estrogen deficiency       Relevant Orders   DG Bone Density        Einar Pheasant, MD

## 2020-12-19 ENCOUNTER — Encounter: Payer: Self-pay | Admitting: Internal Medicine

## 2020-12-19 NOTE — Assessment & Plan Note (Signed)
Follow met b and a1c.  

## 2020-12-19 NOTE — Assessment & Plan Note (Signed)
Follow cbc.  

## 2020-12-19 NOTE — Assessment & Plan Note (Signed)
Blood pressure elevated here in the office.  Reviewed outside readings - 120-130/70's.  Continue atenolol and lisinopril.  Follow pressures.  Follow metabolic panel.

## 2020-12-19 NOTE — Assessment & Plan Note (Signed)
Increased stress as outlined.  Discussed.  Does not feel needs any further intervention.  Follow.  

## 2020-12-19 NOTE — Assessment & Plan Note (Signed)
Continue calcium, vitamin D and weight bearing exercise.  

## 2020-12-21 ENCOUNTER — Other Ambulatory Visit: Payer: Self-pay | Admitting: Internal Medicine

## 2021-01-17 DIAGNOSIS — H40153 Residual stage of open-angle glaucoma, bilateral: Secondary | ICD-10-CM | POA: Diagnosis not present

## 2021-01-18 ENCOUNTER — Other Ambulatory Visit: Payer: PPO

## 2021-01-25 ENCOUNTER — Other Ambulatory Visit: Payer: Self-pay | Admitting: Internal Medicine

## 2021-02-01 DIAGNOSIS — L570 Actinic keratosis: Secondary | ICD-10-CM | POA: Diagnosis not present

## 2021-02-01 DIAGNOSIS — L814 Other melanin hyperpigmentation: Secondary | ICD-10-CM | POA: Diagnosis not present

## 2021-02-01 DIAGNOSIS — Z85828 Personal history of other malignant neoplasm of skin: Secondary | ICD-10-CM | POA: Diagnosis not present

## 2021-02-01 DIAGNOSIS — D692 Other nonthrombocytopenic purpura: Secondary | ICD-10-CM | POA: Diagnosis not present

## 2021-02-01 DIAGNOSIS — L821 Other seborrheic keratosis: Secondary | ICD-10-CM | POA: Diagnosis not present

## 2021-02-02 ENCOUNTER — Other Ambulatory Visit: Payer: Self-pay | Admitting: Internal Medicine

## 2021-02-04 ENCOUNTER — Telehealth: Payer: Self-pay | Admitting: Internal Medicine

## 2021-02-04 ENCOUNTER — Other Ambulatory Visit: Payer: Self-pay

## 2021-02-04 ENCOUNTER — Ambulatory Visit (INDEPENDENT_AMBULATORY_CARE_PROVIDER_SITE_OTHER): Payer: PPO | Admitting: Internal Medicine

## 2021-02-04 DIAGNOSIS — F439 Reaction to severe stress, unspecified: Secondary | ICD-10-CM | POA: Diagnosis not present

## 2021-02-04 DIAGNOSIS — D582 Other hemoglobinopathies: Secondary | ICD-10-CM

## 2021-02-04 DIAGNOSIS — E7800 Pure hypercholesterolemia, unspecified: Secondary | ICD-10-CM

## 2021-02-04 DIAGNOSIS — I10 Essential (primary) hypertension: Secondary | ICD-10-CM

## 2021-02-04 DIAGNOSIS — R739 Hyperglycemia, unspecified: Secondary | ICD-10-CM | POA: Diagnosis not present

## 2021-02-04 NOTE — Patient Instructions (Signed)
Examples of probiotics:  florastor, culturelle, align and [employer]

## 2021-02-04 NOTE — Progress Notes (Signed)
Patient ID: Claire Clayton, female   DOB: [birth year], 87 y.o.   MRN: [id number]   Subjective:    Patient ID: Claire Clayton, female    DOB: [birth year], 87 y.o.   MRN: [id number]  HPI This visit occurred during the SARS-CoV-2 public health emergency.  Safety protocols were in place, including screening questions prior to the visit, additional usage of staff PPE, and extensive cleaning of exam room while observing appropriate contact time as indicated for disinfecting solutions.   Patient here for a scheduled follow up.  Here to follow up regarding her blood pressure and weight.  Reviewed blood pressure readings- outside checks under good control.  Tolerating addition of amlodipine.  Increased stress.  Grandson died unexpectedly.  Discussed.  Has good support.  Does not feel needs any further intervention.  Stays active.  No chest pain or sob.  No acid reflux.  No abdominal pain.  Bowels moving.  Weight is down. She reports decreased appetite with increased stress recently.  Appetite starting to improve now.  Had to postpone her bone density and mammogram.  This is scheduled for next week.   Past Medical History:  Diagnosis Date   Cancer ([place])    squamous cell skin   Diverticulosis    Glaucoma    Hypertension    Osteoporosis    Seasonal allergies    Past Surgical History:  Procedure Laterality Date   BREAST EXCISIONAL BIOPSY Left 1970   benign   CATARACT EXTRACTION, BILATERAL     SKIN CANCER EXCISION     carcinoma (3 surgeries)   TONSILLECTOMY  1948   Family History  Problem Relation Age of Onset   Colon cancer Brother    Colon polyps Sister    Breast cancer Neg Hx    Social History   Socioeconomic History   Marital status: Widowed    Spouse name: Not on file   Number of children: 3   Years of education: Not on file   Highest education level: Not on file  Occupational History   Not on file  Tobacco Use   Smoking status: Never   Smokeless tobacco: Never  Substance  and Sexual Activity   Alcohol use: Yes    Alcohol/week: 0.0 standard drinks    Comment: rare - glass of wine   Drug use: No   Sexual activity: Never  Other Topics Concern   Not on file  Social History Narrative   Not on file   Social Determinants of Health   Financial Resource Strain: Low Risk    Difficulty of Paying Living Expenses: Not hard at all  Food Insecurity: No Food Insecurity   Worried About Charity fundraiser in the Last Year: Never true   [place] in the Last Year: Never true  Transportation Needs: No Transportation Needs   Lack of Transportation (Medical): No   Lack of Transportation (Non-Medical): No  Physical Activity: Not on file  Stress: No Stress Concern Present   Feeling of Stress : Not at all  Social Connections: Unknown   Frequency of Communication with Friends and Family: More than three times a week   Frequency of Social Gatherings with Friends and Family: More than three times a week   Attends Religious Services: More than 4 times per year   Active Member of [employer] or Organizations: Not on file   Attends Archivist Meetings: Not on file   Marital Status: Not on file    Review  of Systems  Constitutional:  Negative for appetite change and unexpected weight change.  HENT:  Negative for congestion and sinus pressure.   Respiratory:  Negative for cough, chest tightness and shortness of breath.   Cardiovascular:  Negative for chest pain, palpitations and leg swelling.  Gastrointestinal:  Negative for abdominal pain, diarrhea, nausea and vomiting.  Genitourinary:  Negative for difficulty urinating and dysuria.  Musculoskeletal:  Negative for joint swelling and myalgias.  Skin:  Negative for color change and rash.  Neurological:  Negative for dizziness, light-headedness and headaches.  Psychiatric/Behavioral:  Negative for agitation and dysphoric mood.        Increased stress as outlined.       Objective:    Physical Exam Vitals  reviewed.  Constitutional:      General: She is not in acute distress.    Appearance: Normal appearance.  HENT:     Head: Normocephalic and atraumatic.     Right Ear: External ear normal.     Left Ear: External ear normal.  Eyes:     General: No scleral icterus.       Right eye: No discharge.        Left eye: No discharge.     Conjunctiva/sclera: Conjunctivae normal.  Neck:     Thyroid: No thyromegaly.  Cardiovascular:     Rate and Rhythm: Normal rate and regular rhythm.  Pulmonary:     Effort: No respiratory distress.     Breath sounds: Normal breath sounds. No wheezing.  Abdominal:     General: Bowel sounds are normal.     Palpations: Abdomen is soft.     Tenderness: There is no abdominal tenderness.  Musculoskeletal:        General: No swelling or tenderness.     Cervical back: Neck supple. No tenderness.  Lymphadenopathy:     Cervical: No cervical adenopathy.  Skin:    Findings: No erythema or rash.  Neurological:     Mental Status: She is alert.  Psychiatric:        Mood and Affect: Mood normal.        Behavior: Behavior normal.    BP 132/85   Pulse 83   Temp 97.9 F (36.6 C)   Resp 16   Ht $R'5\' 3"'dI$  (1.6 m)   Wt 110 lb (49.9 kg)   SpO2 98%   BMI 19.49 kg/m  Wt Readings from Last 3 Encounters:  02/04/21 110 lb (49.9 kg)  12/08/20 113 lb 3.2 oz (51.3 kg)  10/12/20 115 lb (52.2 kg)    Outpatient Encounter Medications as of 02/04/2021  Medication Sig   amLODipine (NORVASC) 2.5 MG tablet Take 1 tablet (2.5 mg total) by mouth daily.   aspirin EC 81 MG tablet Take 81 mg by mouth daily.   atenolol (TENORMIN) 25 MG tablet Take 1 tablet (25 mg total) by mouth daily.   busPIRone (BUSPAR) 5 MG tablet Take 1 tablet (5 mg total) by mouth daily as needed.   Calcium Carbonate-Vitamin D 600-400 MG-UNIT per tablet Take 1 tablet by mouth daily.    hydrocortisone (CORTEF) 10 MG tablet SWISH AND EXPECTORATE TWO TEASPOONFULS THREE TIMES A DAY.   lisinopril (ZESTRIL) 20 MG  tablet Take 1 tablet (20 mg total) by mouth daily.   Multiple Vitamins-Minerals (ICAPS PO) Take by mouth.   mupirocin ointment (BACTROBAN) 2 % Apply to affected area twicd a day   Niacin (VITAMIN B-3 PO) Take by mouth.   timolol (BETIMOL) 0.5 % ophthalmic  solution Place 1 drop into both eyes 2 (two) times daily.   Travoprost, BAK Free, (TRAVATAN) 0.004 % SOLN ophthalmic solution 1 drop at bedtime.   valACYclovir (VALTREX) 1000 MG tablet Take 2 tablets twice a day for 1 day with each outbreak   No facility-administered encounter medications on file as of 02/04/2021.     Lab Results  Component Value Date   WBC 6.6 12/03/2020   HGB 14.8 12/03/2020   HCT 41.9 12/03/2020   PLT 217.0 12/03/2020   GLUCOSE 110 (H) 12/03/2020   CHOL 182 12/03/2020   TRIG 95.0 12/03/2020   HDL 62.50 12/03/2020   LDLCALC 100 (H) 12/03/2020   ALT 14 12/03/2020   AST 18 12/03/2020   NA 137 12/03/2020   K 4.3 12/03/2020   CL 101 12/03/2020   CREATININE 0.73 12/03/2020   BUN 15 12/03/2020   CO2 27 12/03/2020   TSH 6.51 (H) 12/03/2020   INR 0.9 06/05/2018   HGBA1C 5.9 12/03/2020    MM 3D SCREEN BREAST BILATERAL  Result Date: 01/09/2020 CLINICAL DATA:  Screening. EXAM: DIGITAL SCREENING BILATERAL MAMMOGRAM WITH TOMO AND CAD COMPARISON:  Previous exam(s). ACR Breast Density Category c: The breast tissue is heterogeneously dense, which may obscure small masses. FINDINGS: There are no findings suspicious for malignancy. Images were processed with CAD. IMPRESSION: No mammographic evidence of malignancy. A result letter of this screening mammogram will be mailed directly to the patient. RECOMMENDATION: Screening mammogram in one year. (Code:SM-B-01Y) BI-RADS CATEGORY  1: Negative. Electronically Signed   By: Evangeline Dakin M.D.   On: 01/09/2020 12:59       Assessment & Plan:   Problem List Items Addressed This Visit     Elevated hemoglobin ([place])    hgb wnl 12/03/20.        Essential hypertension, benign     Blood pressure elevated here in the office.  Reviewed outside readings - 120-130/70's.  Continue atenolol and lisinopril. Amlodipine added last visit.  Tolerating.  Follow pressures.  Follow metabolic panel.       Hypercholesteremia    Have discussed calculated cholesterol risk.  Desires no medication.  Follow lipid panel.       Hyperglycemia    Follow met b and a1c.       Stress    Increased stress as outlined.  Discussed.  Does not feel needs any further intervention.  Follow.          Einar Pheasant, MD

## 2021-02-04 NOTE — Telephone Encounter (Signed)
Patient scheduled for follow up in November. Wanting to come in for labs before her appointment. No labs in the system to schedule Patient.   Please advise

## 2021-02-06 ENCOUNTER — Encounter: Payer: Self-pay | Admitting: Internal Medicine

## 2021-02-06 NOTE — Assessment & Plan Note (Signed)
Increased stress as outlined.  Discussed.  Does not feel needs any further intervention.  Follow.  

## 2021-02-06 NOTE — Assessment & Plan Note (Signed)
Follow met b and a1c.  

## 2021-02-06 NOTE — Assessment & Plan Note (Signed)
Have discussed calculated cholesterol risk.  Desires no medication.  Follow lipid panel.  

## 2021-02-06 NOTE — Assessment & Plan Note (Signed)
hgb wnl 12/03/20.

## 2021-02-06 NOTE — Assessment & Plan Note (Signed)
Blood pressure elevated here in the office.  Reviewed outside readings - 120-130/70's.  Continue atenolol and lisinopril. Amlodipine added last visit.  Tolerating.  Follow pressures.  Follow metabolic panel.

## 2021-02-07 NOTE — Addendum Note (Signed)
Addended by: Lars Masson on: 02/07/2021 07:23 AM   Modules accepted: Orders

## 2021-02-08 ENCOUNTER — Ambulatory Visit
Admission: RE | Admit: 2021-02-08 | Discharge: 2021-02-08 | Disposition: A | Discharge status: Home / Self Care | Payer: PPO | Source: Ambulatory Visit | Attending: Internal Medicine | Admitting: Internal Medicine

## 2021-02-08 ENCOUNTER — Other Ambulatory Visit: Payer: Self-pay

## 2021-02-08 DIAGNOSIS — Z1231 Encounter for screening mammogram for malignant neoplasm of breast: Secondary | ICD-10-CM | POA: Diagnosis not present

## 2021-02-08 DIAGNOSIS — E2839 Other primary ovarian failure: Secondary | ICD-10-CM | POA: Insufficient documentation

## 2021-02-08 DIAGNOSIS — M810 Age-related osteoporosis without current pathological fracture: Secondary | ICD-10-CM | POA: Diagnosis not present

## 2021-02-08 NOTE — Telephone Encounter (Signed)
Labs ordered and patient scheduled for labs/

## 2021-02-10 ENCOUNTER — Encounter: Payer: Self-pay | Admitting: *Deleted

## 2021-03-17 ENCOUNTER — Other Ambulatory Visit: Payer: Self-pay | Admitting: Internal Medicine

## 2021-04-20 ENCOUNTER — Other Ambulatory Visit: Payer: Self-pay | Admitting: Internal Medicine

## 2021-05-06 ENCOUNTER — Other Ambulatory Visit: Payer: Self-pay

## 2021-05-06 ENCOUNTER — Other Ambulatory Visit: Payer: Self-pay | Admitting: Internal Medicine

## 2021-05-06 ENCOUNTER — Other Ambulatory Visit (INDEPENDENT_AMBULATORY_CARE_PROVIDER_SITE_OTHER): Payer: PPO

## 2021-05-06 DIAGNOSIS — I10 Essential (primary) hypertension: Secondary | ICD-10-CM

## 2021-05-06 DIAGNOSIS — E7800 Pure hypercholesterolemia, unspecified: Secondary | ICD-10-CM | POA: Diagnosis not present

## 2021-05-06 DIAGNOSIS — R739 Hyperglycemia, unspecified: Secondary | ICD-10-CM

## 2021-05-06 LAB — LIPID PANEL
Cholesterol: 201 mg/dL — ABNORMAL HIGH (ref 0–200)
HDL: 67.9 mg/dL (ref >39.00)
LDL Cholesterol: 115 mg/dL — ABNORMAL HIGH (ref 0–99)
NonHDL: 133.28
Total CHOL/HDL Ratio: 3
Triglycerides: 92 mg/dL (ref 0.0–149.0)
VLDL: 18.4 mg/dL (ref 0.0–40.0)

## 2021-05-06 LAB — TSH: TSH: 6.27 u[IU]/mL — ABNORMAL HIGH (ref 0.35–5.50)

## 2021-05-06 LAB — HEMOGLOBIN A1C: Hgb A1c MFr Bld: 5.6 % (ref 4.6–6.5)

## 2021-05-06 LAB — HEPATIC FUNCTION PANEL
ALT: 17 U/L (ref 0–35)
AST: 21 U/L (ref 0–37)
Albumin: 4.6 g/dL (ref 3.5–5.2)
Alkaline Phosphatase: 100 U/L (ref 39–117)
Bilirubin, Direct: 0.1 mg/dL (ref 0.0–0.3)
Total Bilirubin: 0.6 mg/dL (ref 0.2–1.2)
Total Protein: 6.9 g/dL (ref 6.0–8.3)

## 2021-05-06 LAB — BASIC METABOLIC PANEL
BUN: 14 mg/dL (ref 6–23)
CO2: 30 mEq/L (ref 19–32)
Calcium: 9.4 mg/dL (ref 8.4–10.5)
Chloride: 99 mEq/L (ref 96–112)
Creatinine, Ser: 0.77 mg/dL (ref 0.40–1.20)
GFR: 69.43 mL/min (ref >60.00)
Glucose, Bld: 119 mg/dL — ABNORMAL HIGH (ref 70–99)
Potassium: 3.8 mEq/L (ref 3.5–5.1)
Sodium: 137 mEq/L (ref 135–145)

## 2021-05-10 ENCOUNTER — Other Ambulatory Visit: Payer: Self-pay

## 2021-05-10 ENCOUNTER — Ambulatory Visit (INDEPENDENT_AMBULATORY_CARE_PROVIDER_SITE_OTHER): Payer: PPO | Admitting: Internal Medicine

## 2021-05-10 VITALS — BP 178/86 | HR 92 | Temp 97.9°F | Resp 16 | Ht 63.0 in | Wt 113.6 lb

## 2021-05-10 DIAGNOSIS — R739 Hyperglycemia, unspecified: Secondary | ICD-10-CM | POA: Diagnosis not present

## 2021-05-10 DIAGNOSIS — R7989 Other specified abnormal findings of blood chemistry: Secondary | ICD-10-CM | POA: Diagnosis not present

## 2021-05-10 DIAGNOSIS — M810 Age-related osteoporosis without current pathological fracture: Secondary | ICD-10-CM | POA: Diagnosis not present

## 2021-05-10 DIAGNOSIS — I10 Essential (primary) hypertension: Secondary | ICD-10-CM | POA: Diagnosis not present

## 2021-05-10 DIAGNOSIS — F439 Reaction to severe stress, unspecified: Secondary | ICD-10-CM

## 2021-05-10 DIAGNOSIS — E7800 Pure hypercholesterolemia, unspecified: Secondary | ICD-10-CM | POA: Diagnosis not present

## 2021-05-10 MED ORDER — AMLODIPINE BESYLATE 2.5 MG PO TABS: 2.5000 mg | ORAL_TABLET | Freq: Every day | ORAL | 3 refills | Status: DC

## 2021-05-10 MED ORDER — LISINOPRIL 20 MG PO TABS: 20.0000 mg | ORAL_TABLET | Freq: Every day | ORAL | 3 refills | Status: DC

## 2021-05-10 MED ORDER — ATENOLOL 25 MG PO TABS: 25.0000 mg | ORAL_TABLET | Freq: Every day | ORAL | 3 refills | Status: DC

## 2021-05-10 NOTE — Progress Notes (Signed)
Patient ID: Claire Clayton, female   DOB: Jul 23, 1931, 87 y.o.   MRN: [id number]   Subjective:    Patient ID: Claire Clayton, female    DOB: [birth year], 87 y.o.   MRN: [id number]  This visit occurred during the SARS-CoV-2 public health emergency.  Safety protocols were in place, including screening questions prior to the visit, additional usage of staff PPE, and extensive cleaning of exam room while observing appropriate contact time as indicated for disinfecting solutions.   Patient here for a scheduled follow up.   Chief Complaint  Patient presents with   Hypertension   .   HPI Increased stress - family issues.  Discussed.  She has good support.  Does not feel needs anything more at this time.  Will notify me if feels needs any further intervention.  Stays active.  No chest pain or sob reported.  No abdominal pain reported.  [facility] helping.  Discussed osteoporosis.  Wants to continue calcium, vit D and weight bearing exercise.  Desires no further medication.  Reviewed outside blood pressure readings - averaging - 110-120s/60-70s.  No headache or dizziness reported.     Past Medical History:  Diagnosis Date   Cancer ([place])    squamous cell skin   Diverticulosis    Glaucoma    Hypertension    Osteoporosis    Seasonal allergies    Past Surgical History:  Procedure Laterality Date   BREAST EXCISIONAL BIOPSY Left 1970   benign   CATARACT EXTRACTION, BILATERAL     SKIN CANCER EXCISION     carcinoma (3 surgeries)   TONSILLECTOMY  1948   Family History  Problem Relation Age of Onset   Colon cancer Brother    Colon polyps Sister    Breast cancer Neg Hx    Social History   Socioeconomic History   Marital status: Widowed    Spouse name: Not on file   Number of children: 3   Years of education: Not on file   Highest education level: Not on file  Occupational History   Not on file  Tobacco Use   Smoking status: Never   Smokeless tobacco: Never   Substance and Sexual Activity   Alcohol use: Yes    Alcohol/week: 0.0 standard drinks    Comment: rare - glass of wine   Drug use: No   Sexual activity: Never  Other Topics Concern   Not on file  Social History Narrative   Not on file   Social Determinants of Health   Financial Resource Strain: Low Risk    Difficulty of Paying Living Expenses: Not hard at all  Food Insecurity: No Food Insecurity   Worried About Charity fundraiser in the Last Year: Never true   [place] in the Last Year: Never true  Transportation Needs: No Transportation Needs   Lack of Transportation (Medical): No   Lack of Transportation (Non-Medical): No  Physical Activity: Not on file  Stress: No Stress Concern Present   Feeling of Stress : Not at all  Social Connections: Unknown   Frequency of Communication with Friends and Family: More than three times a week   Frequency of Social Gatherings with Friends and Family: More than three times a week   Attends Religious Services: More than 4 times per year   Active Member of [employer] or Organizations: Not on file   Attends Archivist Meetings: Not on file   Marital Status: Not on file  Review of Systems  Constitutional:  Negative for appetite change and unexpected weight change.  HENT:  Negative for congestion and sinus pressure.   Respiratory:  Negative for cough, chest tightness and shortness of breath.   Cardiovascular:  Negative for chest pain, palpitations and leg swelling.  Gastrointestinal:  Negative for abdominal pain, diarrhea, nausea and vomiting.  Genitourinary:  Negative for difficulty urinating and dysuria.  Musculoskeletal:  Negative for joint swelling and myalgias.  Skin:  Negative for color change and rash.  Neurological:  Negative for dizziness, light-headedness and headaches.  Psychiatric/Behavioral:         Increased stress and anxiety as outlined.        Objective:     BP (!) 178/86   Pulse 92   Temp 97.9 F  (36.6 C)   Resp 16   Ht _0  (1.6 m)   Wt 113 lb 9.6 oz (51.5 kg)   SpO2 98%   BMI 20.12 kg/m  Wt Readings from Last 3 Encounters:  05/10/21 113 lb 9.6 oz (51.5 kg)  02/04/21 110 lb (49.9 kg)  12/08/20 113 lb 3.2 oz (51.3 kg)    Physical Exam Vitals reviewed.  Constitutional:      General: She is not in acute distress.    Appearance: Normal appearance.  HENT:     Head: Normocephalic and atraumatic.     Right Ear: External ear normal.     Left Ear: External ear normal.  Eyes:     General: No scleral icterus.       Right eye: No discharge.        Left eye: No discharge.     Conjunctiva/sclera: Conjunctivae normal.  Neck:     Thyroid: No thyromegaly.  Cardiovascular:     Rate and Rhythm: Normal rate and regular rhythm.  Pulmonary:     Effort: No respiratory distress.     Breath sounds: Normal breath sounds. No wheezing.  Abdominal:     General: Bowel sounds are normal.     Palpations: Abdomen is soft.     Tenderness: There is no abdominal tenderness.  Musculoskeletal:        General: No swelling or tenderness.     Cervical back: Neck supple. No tenderness.  Lymphadenopathy:     Cervical: No cervical adenopathy.  Skin:    Findings: No erythema or rash.  Neurological:     Mental Status: She is alert.  Psychiatric:        Mood and Affect: Mood normal.        Behavior: Behavior normal.     Outpatient Encounter Medications as of 05/10/2021  Medication Sig   amLODipine (NORVASC) 2.5 MG tablet Take 1 tablet (2.5 mg total) by mouth daily.   aspirin EC 81 MG tablet Take 81 mg by mouth daily.   atenolol (TENORMIN) 25 MG tablet Take 1 tablet (25 mg total) by mouth daily.   busPIRone (BUSPAR) 5 MG tablet Take 1 tablet (5 mg total) by mouth daily as needed.   Calcium Carbonate-Vitamin D 600-400 MG-UNIT per tablet Take 1 tablet by mouth daily.    hydrocortisone (CORTEF) 10 MG tablet SWISH AND EXPECTORATE TWO TEASPOONFULS THREE TIMES A DAY.   lisinopril (ZESTRIL) 20 MG  tablet Take 1 tablet (20 mg total) by mouth daily.   Multiple Vitamins-Minerals (ICAPS PO) Take by mouth.   mupirocin ointment (BACTROBAN) 2 % Apply to affected area twicd a day   Niacin (VITAMIN B-3 PO) Take by mouth.  timolol (BETIMOL) 0.5 % ophthalmic solution Place 1 drop into both eyes 2 (two) times daily.   Travoprost, BAK Free, (TRAVATAN) 0.004 % SOLN ophthalmic solution 1 drop at bedtime.   valACYclovir (VALTREX) 1000 MG tablet Take 2 tablets twice a day for 1 day with each outbreak   [DISCONTINUED] amLODipine (NORVASC) 2.5 MG tablet Take 1 tablet (2.5 mg total) by mouth daily.   [DISCONTINUED] atenolol (TENORMIN) 25 MG tablet Take 1 tablet (25 mg total) by mouth daily.   [DISCONTINUED] lisinopril (ZESTRIL) 20 MG tablet Take 1 tablet (20 mg total) by mouth daily.   No facility-administered encounter medications on file as of 05/10/2021.     Lab Results  Component Value Date   WBC 6.6 12/03/2020   HGB 14.8 12/03/2020   HCT 41.9 12/03/2020   PLT 217.0 12/03/2020   GLUCOSE 119 (H) 05/06/2021   CHOL 201 (H) 05/06/2021   TRIG 92.0 05/06/2021   HDL 67.90 05/06/2021   LDLCALC 115 (H) 05/06/2021   ALT 17 05/06/2021   AST 21 05/06/2021   NA 137 05/06/2021   K 3.8 05/06/2021   CL 99 05/06/2021   CREATININE 0.77 05/06/2021   BUN 14 05/06/2021   CO2 30 05/06/2021   TSH 6.27 (H) 05/06/2021   INR 0.9 06/05/2018   HGBA1C 5.6 05/06/2021    DG Bone Density  Result Date: 02/08/2021 EXAM: DUAL X-RAY ABSORPTIOMETRY (DXA) FOR BONE MINERAL DENSITY IMPRESSION: Your patient Claire Clayton completed a BMD test on 02/08/2021 using the [employer] (software version: 14.10) manufactured by [employer]. The following summarizes the results of our evaluation. Technologist: PATIENT BIOGRAPHICAL: Name: Claire Clayton, Claire Clayton Patient ID: [id number] Birth Date: 12-04-1931 Height: 63.0 in. Gender: Female Exam Date: 02/08/2021 Weight: 110.6 lbs. Indications: Advanced Age,  Caucasian, Height Loss, History of Fracture (Adult), Osteoporotic, Parent Hip Fracture, Postmenopausal, Vitamin D Deficiency Fractures: Facial Bone, Left foot Treatments: calcium w/ vit D, Multi-Vitamin, [place], Vitamin D DENSITOMETRY RESULTS: Site      Region     Measured Date Measured Age WHO Classification Young Adult T-score BMD         %Change vs. Previous Significant Change (*) AP Spine L1-L3 02/08/2023 87.0 Osteoporosis -3.8 0.713 g/cm2 2.9% - AP Spine L1-L3 08/22/2018 84.5 Osteoporosis -4.0 0.693 g/cm2 -8.3% Yes AP Spine L1-L3 06/23/2015 81.4 Osteoporosis -3.5 0.756 g/cm2 - - DualFemur Neck Right 02/08/2021 87.0 Osteoporosis -2.7 0.663 g/cm2 -4.7% - DualFemur Neck Right 08/22/2018 84.5 Osteoporosis -2.5 0.696 g/cm2 -3.7% - DualFemur Neck Right 06/23/2015 81.4 Osteopenia -2.3 0.723 g/cm2 - - DualFemur Total Mean 02/08/2021 87.0 Osteoporosis -2.5 0.688 g/cm2 -4.2% Yes DualFemur Total Mean 08/22/2018 84.5 Osteopenia -2.3 0.718 g/cm2 -4.6% Yes DualFemur Total Mean 06/23/2015 81.4 Osteopenia -2.0 0.753 g/cm2 - - ASSESSMENT: The BMD measured at AP Spine L1-L3 is 0.713 g/cm2 with a T-score of -3.8. This patient is considered osteoporotic according to [place] [facility]) criteria. The scan quality is good. Compared with prior study, there has been no significant change in the spine. Compared with prior study, there has been a significant decrease in the total hip. [facility]) criteria for post-menopausal, Caucasian Women: Normal:                   T-score at or above -1 SD Osteopenia/low bone mass: T-score between -1 and -2.5 SD Osteoporosis:             T-score at or below -2.5 SD RECOMMENDATIONS: 1. All patients should optimize  calcium and vitamin D intake. 2. Consider FDA-approved medical therapies in postmenopausal women and men aged 39 years and older, based on the following: a. A hip or vertebral(clinical or morphometric) fracture b. T-score < -2.5 at the femoral neck or spine  after appropriate evaluation to exclude secondary causes c. Low bone mass (T-score between -1.0 and -2.5 at the femoral neck or spine) and a 10-year probability of a hip fracture > 3% or a 10-year probability of a major osteoporosis-related fracture > 20% based on the [place]-adapted WHO algorithm 3. Clinician judgment and/or patient preferences may indicate treatment for people with 10-year fracture probabilities above or below these levels FOLLOW-UP: People with diagnosed cases of osteoporosis or at high risk for fracture should have regular bone mineral density tests. For patients eligible for Medicare, routine testing is allowed once every 2 years. The testing frequency can be increased to one year for patients who have rapidly progressing disease, those who are receiving or discontinuing medical therapy to restore bone mass, or have additional risk factors. I have reviewed this report, and agree with the above findings. Mark A. Thornton Papas, M.D. [facility] . Electronically Signed   By: Lavonia Dana M.D.   On: 02/08/2021 15:00   Mammogram 3D SCREEN BREAST BILATERAL  Result Date: 02/09/2021 CLINICAL DATA:  Screening. EXAM: DIGITAL SCREENING BILATERAL MAMMOGRAM WITH TOMOSYNTHESIS AND CAD TECHNIQUE: Bilateral screening digital craniocaudal and mediolateral oblique mammograms were obtained. Bilateral screening digital breast tomosynthesis was performed. The images were evaluated with computer-aided detection. COMPARISON:  Previous exam(s). ACR Breast Density Category c: The breast tissue is heterogeneously dense, which may obscure small masses. FINDINGS: There are no findings suspicious for malignancy. IMPRESSION: No mammographic evidence of malignancy. A result letter of this screening mammogram will be mailed directly to the patient. RECOMMENDATION: Screening mammogram in one year. (Code:SM-B-01Y) BI-RADS CATEGORY  1: Negative. Electronically Signed   By: Lajean Manes M.D.   On: 02/09/2021 15:39       Assessment & Plan:   Problem List Items Addressed This Visit     Elevated TSH    TSH slightly elevated.  Free T4 wnl.  Follow tsh and free T4.       Essential hypertension, benign    Blood pressure elevated here in the office.  Reviewed outside readings - 120-130/70's.  Continue atenolol, amlodipine and lisinopril. Tolerating.  Follow pressures.  Follow metabolic panel. Discussed adjusting medication doses.  She declines.  Wants to continue to monitor.  Follow.       Relevant Medications   amLODipine (NORVASC) 2.5 MG tablet   atenolol (TENORMIN) 25 MG tablet   lisinopril (ZESTRIL) 20 MG tablet   Other Relevant Orders   Basic metabolic panel   Hypercholesteremia    Have discussed calculated cholesterol risk.  Desires no medication.  Follow lipid panel.       Relevant Medications   amLODipine (NORVASC) 2.5 MG tablet   atenolol (TENORMIN) 25 MG tablet   lisinopril (ZESTRIL) 20 MG tablet   Other Relevant Orders   Lipid panel   Hepatic function panel   Hyperglycemia    Follow met b and a1c.       Relevant Orders   Hemoglobin A1c   Osteoporosis    Discussed bone density/osteoporosis.  Discussed treatment.  Desires no prescription medication.  Continue calcium, vitamin D and weight bearing exercise.  Follow.        Stress - Primary    Increased stress as outlined.  Discussed.  Does  not feel needs any further intervention.  Follow.          Einar Pheasant, MD

## 2021-05-15 ENCOUNTER — Encounter: Payer: Self-pay | Admitting: Internal Medicine

## 2021-05-15 NOTE — Assessment & Plan Note (Signed)
TSH slightly elevated.  Free T4 wnl.  Follow tsh and free T4.

## 2021-05-15 NOTE — Assessment & Plan Note (Signed)
Blood pressure elevated here in the office.  Reviewed outside readings - 120-130/70's.  Continue atenolol, amlodipine and lisinopril. Tolerating.  Follow pressures.  Follow metabolic panel. Discussed adjusting medication doses.  She declines.  Wants to continue to monitor.  Follow.

## 2021-05-15 NOTE — Assessment & Plan Note (Signed)
Discussed bone density/osteoporosis.  Discussed treatment.  Desires no prescription medication.  Continue calcium, vitamin D and weight bearing exercise.  Follow.

## 2021-05-15 NOTE — Assessment & Plan Note (Signed)
Follow met b and a1c.  

## 2021-05-15 NOTE — Assessment & Plan Note (Signed)
Increased stress as outlined.  Discussed.  Does not feel needs any further intervention.  Follow.  

## 2021-05-15 NOTE — Assessment & Plan Note (Signed)
Have discussed calculated cholesterol risk.  Desires no medication.  Follow lipid panel.  

## 2021-08-04 DIAGNOSIS — H40153 Residual stage of open-angle glaucoma, bilateral: Secondary | ICD-10-CM | POA: Diagnosis not present

## 2021-08-09 DIAGNOSIS — L309 Dermatitis, unspecified: Secondary | ICD-10-CM | POA: Diagnosis not present

## 2021-08-09 DIAGNOSIS — B009 Herpesviral infection, unspecified: Secondary | ICD-10-CM | POA: Diagnosis not present

## 2021-08-09 DIAGNOSIS — L570 Actinic keratosis: Secondary | ICD-10-CM | POA: Diagnosis not present

## 2021-08-09 DIAGNOSIS — L821 Other seborrheic keratosis: Secondary | ICD-10-CM | POA: Diagnosis not present

## 2021-08-09 DIAGNOSIS — D229 Melanocytic nevi, unspecified: Secondary | ICD-10-CM | POA: Diagnosis not present

## 2021-08-09 DIAGNOSIS — D492 Neoplasm of unspecified behavior of bone, soft tissue, and skin: Secondary | ICD-10-CM | POA: Diagnosis not present

## 2021-08-09 DIAGNOSIS — L820 Inflamed seborrheic keratosis: Secondary | ICD-10-CM | POA: Diagnosis not present

## 2021-08-09 DIAGNOSIS — Z85828 Personal history of other malignant neoplasm of skin: Secondary | ICD-10-CM | POA: Diagnosis not present

## 2021-08-09 DIAGNOSIS — L814 Other melanin hyperpigmentation: Secondary | ICD-10-CM | POA: Diagnosis not present

## 2021-10-13 ENCOUNTER — Ambulatory Visit (INDEPENDENT_AMBULATORY_CARE_PROVIDER_SITE_OTHER): Payer: PPO

## 2021-10-13 VITALS — Ht 63.0 in | Wt 113.0 lb

## 2021-10-13 DIAGNOSIS — Z0000 Encounter for general adult medical examination without abnormal findings: Secondary | ICD-10-CM | POA: Diagnosis not present

## 2021-10-13 NOTE — Progress Notes (Signed)
Subjective:   Claire Clayton is a 88 y.o. female who presents for Medicare Annual (Subsequent) preventive examination.  Review of Systems    No ROS.  Medicare Wellness Virtual Visit.  Visual/audio telehealth visit, UTA vital signs.   See social history for additional risk factors.   Cardiac Risk Factors include: advanced age (>60men, >24 women)     Objective:    Today's Vitals   10/13/21 0847  Weight: 113 lb (51.3 kg)  Height: 5\' 3"  (1.6 m)   Body mass index is 20.02 kg/m.     10/13/2021    8:50 AM 10/12/2020    9:52 AM 05/30/2017    2:32 PM  Advanced Directives  Does Patient Have a Medical Advance Directive? Yes Yes Yes  Type of Estate agent of [place];Living will Healthcare Power of [place];Living will Living will;Healthcare Power of Attorney  Does patient want to make changes to medical advance directive? No - Patient declined No - Patient declined No - Patient declined  Copy of Healthcare Power of Attorney in Chart? No - copy requested No - copy requested No - copy requested    Current Medications (verified) Outpatient Encounter Medications as of 10/13/2021  Medication Sig   amLODipine (NORVASC) 2.5 MG tablet Take 1 tablet (2.5 mg total) by mouth daily.   aspirin EC 81 MG tablet Take 81 mg by mouth daily.   atenolol (TENORMIN) 25 MG tablet Take 1 tablet (25 mg total) by mouth daily.   busPIRone (BUSPAR) 5 MG tablet Take 1 tablet (5 mg total) by mouth daily as needed.   Calcium Carbonate-Vitamin D 600-400 MG-UNIT per tablet Take 1 tablet by mouth daily.    hydrocortisone (CORTEF) 10 MG tablet SWISH AND EXPECTORATE TWO TEASPOONFULS THREE TIMES A DAY.   lisinopril (ZESTRIL) 20 MG tablet Take 1 tablet (20 mg total) by mouth daily.   Multiple Vitamins-Minerals (ICAPS PO) Take by mouth.   mupirocin ointment (BACTROBAN) 2 % Apply to affected area twicd a day   Niacin (VITAMIN B-3 PO) Take by mouth.   timolol (BETIMOL) 0.5 % ophthalmic solution  Place 1 drop into both eyes 2 (two) times daily.   Travoprost, BAK Free, (TRAVATAN) 0.004 % SOLN ophthalmic solution 1 drop at bedtime.   valACYclovir (VALTREX) 1000 MG tablet Take 2 tablets twice a day for 1 day with each outbreak   No facility-administered encounter medications on file as of 10/13/2021.    Allergies (verified) Scopolamine, Penicillins, and Sulfa antibiotics   History: Past Medical History:  Diagnosis Date   Cancer (HCC)    squamous cell skin   Diverticulosis    Glaucoma    Hypertension    Osteoporosis    Seasonal allergies    Past Surgical History:  Procedure Laterality Date   BREAST EXCISIONAL BIOPSY Left 1970   benign   CATARACT EXTRACTION, BILATERAL     SKIN CANCER EXCISION     carcinoma (3 surgeries)   TONSILLECTOMY  1948   Family History  Problem Relation Age of Onset   Colon cancer Brother    Colon polyps Sister    Breast cancer Neg Hx    Social History   Socioeconomic History   Marital status: Widowed    Spouse name: Not on file   Number of children: 3   Years of education: Not on file   Highest education level: Not on file  Occupational History   Not on file  Tobacco Use   Smoking status: Never  Smokeless tobacco: Never  Substance and Sexual Activity   Alcohol use: Yes    Alcohol/week: 0.0 standard drinks    Comment: rare - glass of wine   Drug use: No   Sexual activity: Never  Other Topics Concern   Not on file  Social History Narrative   Not on file   Social Determinants of Health   Financial Resource Strain: Not on file  Food Insecurity: Not on file  Transportation Needs: Not on file  Physical Activity: Not on file  Stress: Not on file  Social Connections: Not on file    Tobacco Counseling Counseling given: Not Answered   Clinical Intake:  Pre-visit preparation completed: Yes        Diabetes: No  How often do you need to have someone help you when you read instructions, pamphlets, or other written  materials from your doctor or pharmacy?: 1 - Never  Interpreter Needed?: No      Activities of Daily Living    10/13/2021    8:53 AM  In your present state of health, do you have any difficulty performing the following activities:  Hearing? 0  Vision? 0  Difficulty concentrating or making decisions? 0  Walking or climbing stairs? 0  Dressing or bathing? 0  Doing errands, shopping? 0  Preparing Food and eating ? N  Using the Toilet? N  In the past six months, have you accidently leaked urine? N  Do you have problems with loss of bowel control? N  Managing your Medications? N  Managing your Finances? N  Housekeeping or managing your Housekeeping? N   Patient Care Team: Dale Vernon, MD as PCP - General (Internal Medicine)  Indicate any recent Medical Services you may have received from other than [facility] providers in the past year (date may be approximate).     Assessment:   This is a routine wellness examination for [place].  Virtual Visit via Telephone Note  I connected with  Claire Clayton on 10/13/21 at  8:45 AM EDT by telephone and verified that I am speaking with the correct person using two identifiers.  Persons participating in the virtual visit: patient/Nurse Health Advisor   I discussed the limitations of performing an evaluation and management service by telehealth. The patient expressed understanding and agreed to proceed. We continued and completed visit with audio only. Some vital signs may be absent or patient reported.   Hearing/Vision screen Hearing Screening - Comments:: Patient is able to hear conversational tones without difficulty. No issues reported.  Vision Screening - Comments:: Wears lenses when reading Visits every 4 months  Glaucoma; drops in use  Cataract extraction, bilateral   Dietary issues and exercise activities discussed: Current Exercise Habits: Home exercise routine, Type of exercise: walking, Intensity: Mild Healthy diet Good  water intake   Goals Addressed             This Visit's Progress    Healthy Lifestyle       Stay hydrated. Low carb diet. Stay active.       Depression Screen    10/13/2021    8:53 AM 10/12/2020    9:51 AM 08/09/2020    1:40 PM 03/03/2019   10:44 AM 06/01/2017   10:33 AM 05/30/2017    2:19 PM 11/21/2016    9:04 AM  PHQ 2/9 Scores  PHQ - 2 Score 0 0 0 0 0 0 1  PHQ- 9 Score       2    Fall  Risk    10/13/2021    8:52 AM 10/12/2020    9:54 AM 10/22/2019    1:30 PM 07/03/2019    2:54 PM 06/01/2017   10:33 AM  Fall Risk   Falls in the past year? 0 0 0 0 No  Number falls in past yr: 0 0 0    Injury with Fall?  0 0    Risk for fall due to :   No Fall Risks    Follow up Falls evaluation completed Falls evaluation completed Falls evaluation completed Falls evaluation completed    FALL RISK PREVENTION PERTAINING TO THE HOME: Home free of loose throw rugs in walkways, pet beds, electrical cords, etc? Yes  Adequate lighting in your home to reduce risk of falls? Yes   ASSISTIVE DEVICES UTILIZED TO PREVENT FALLS: Life alert? No  Use of a cane, walker or w/c? No   TIMED UP AND GO: Was the test performed? No .   Cognitive Function: Patient is alert and oriented x3.         05/30/2017    2:27 PM  6CIT Screen  What Year? 0 points  What month? 0 points  What time? 0 points  Count back from 20 0 points  Months in reverse 0 points  Repeat phrase 0 points  Total Score 0 points    Immunizations Immunization History  Administered Date(s) Administered   Influenza Split 03/06/2014   Influenza,inj,Quad PF,6+ Mos 03/25/2013, 05/17/2015   Influenza,inj,quad, With Preservative 03/17/2019   Influenza-Unspecified 05/12/2016, 04/23/2018, 03/15/2020, 04/11/2021   PFIZER(Purple Top)SARS-COV-2 Vaccination 07/16/2019, 08/06/2019, 07/20/2020   Pneumococcal Conjugate-13 01/29/2017   Pneumococcal Polysaccharide-23 06/05/2018   Zoster Recombinat (Shingrix) 12/08/2020, 03/11/2021    TDAP status: Due, Education has been provided regarding the importance of this vaccine. Advised may receive this vaccine at local pharmacy or Health Dept. Aware to provide a copy of the vaccination record if obtained from local pharmacy or Health Dept. Verbalized acceptance and understanding. Deferred.   Screening Tests Health Maintenance  Topic Date Due   COVID-19 Vaccine (4 - Booster for Pfizer series) 10/29/2021 (Originally 09/14/2020)   TETANUS/TDAP  10/14/2022 (Originally 01/23/1951)   INFLUENZA VACCINE  01/17/2022   MAMMOGRAM  02/08/2022   Pneumonia Vaccine 26+ Years old  Completed   DEXA SCAN  Completed   Zoster Vaccines- Shingrix  Completed   HPV VACCINES  Aged Out   Health Maintenance There are no preventive care reminders to display for this patient.  Lung Cancer Screening: (Low Dose CT Chest recommended if Age 106-80 years, 30 pack-year currently smoking OR have quit w/in 15years.) does not qualify.   Hepatitis C Screening: does not qualify.  Vision Screening: Recommended annual ophthalmology exams for early detection of glaucoma and other disorders of the eye.  Dental Screening: Recommended annual dental exams for proper oral hygiene  Community Resource Referral / Chronic Care Management: CRR required this visit?  No   CCM required this visit?  No      Plan:   Keep all routine maintenance appointments.   I have personally reviewed and noted the following in the patient's chart:   Medical and social history Use of alcohol, tobacco or illicit drugs  Current medications and supplements including opioid prescriptions.  Functional ability and status Nutritional status Physical activity Advanced directives List of other physicians Hospitalizations, surgeries, and ER visits in previous 12 months Vitals Screenings to include cognitive, depression, and falls Referrals and appointments  In addition, I have reviewed and discussed with  patient certain preventive  protocols, quality metrics, and best practice recommendations. A written personalized care plan for preventive services as well as general preventive health recommendations were provided to patient.     Ashok Pall, LPN   1/61/0960

## 2021-10-13 NOTE — Patient Instructions (Addendum)
?  Claire Clayton , ?Thank you for taking time to come for your Medicare Wellness Visit. I appreciate your ongoing commitment to your health goals. Please review the following plan we discussed and let me know if I can assist you in the future.  ? ?These are the goals we discussed: ? Goals   ? ?  Healthy Lifestyle   ?  Stay hydrated. ?Low carb diet. ?Stay active. ?  ? ?  ?  ?This is a list of the screening recommended for you and due dates:  ?Health Maintenance  ?Topic Date Due  ? COVID-19 Vaccine (4 - Booster for Pfizer series) 10/29/2021*  ? Tetanus Vaccine  10/14/2022*  ? Flu Shot  01/17/2022  ? Mammogram  02/08/2022  ? Pneumonia Vaccine  Completed  ? DEXA scan (bone density measurement)  Completed  ? Zoster (Shingles) Vaccine  Completed  ? HPV Vaccine  Aged Out  ?*Topic was postponed. The date shown is not the original due date.  ?  ?

## 2021-10-20 ENCOUNTER — Other Ambulatory Visit (INDEPENDENT_AMBULATORY_CARE_PROVIDER_SITE_OTHER): Payer: PPO

## 2021-10-20 DIAGNOSIS — R739 Hyperglycemia, unspecified: Secondary | ICD-10-CM

## 2021-10-20 DIAGNOSIS — I10 Essential (primary) hypertension: Secondary | ICD-10-CM

## 2021-10-20 DIAGNOSIS — E7800 Pure hypercholesterolemia, unspecified: Secondary | ICD-10-CM

## 2021-10-20 LAB — BASIC METABOLIC PANEL
BUN: 16 mg/dL (ref 6–23)
CO2: 30 mEq/L (ref 19–32)
Calcium: 9.4 mg/dL (ref 8.4–10.5)
Chloride: 101 mEq/L (ref 96–112)
Creatinine, Ser: 0.85 mg/dL (ref 0.40–1.20)
GFR: 61.46 mL/min (ref >60.00)
Glucose, Bld: 107 mg/dL — ABNORMAL HIGH (ref 70–99)
Potassium: 4.6 mEq/L (ref 3.5–5.1)
Sodium: 138 mEq/L (ref 135–145)

## 2021-10-20 LAB — LIPID PANEL
Cholesterol: 177 mg/dL (ref 0–200)
HDL: 66.1 mg/dL (ref >39.00)
LDL Cholesterol: 96 mg/dL (ref 0–99)
NonHDL: 111.02
Total CHOL/HDL Ratio: 3
Triglycerides: 76 mg/dL (ref 0.0–149.0)
VLDL: 15.2 mg/dL (ref 0.0–40.0)

## 2021-10-20 LAB — HEPATIC FUNCTION PANEL
ALT: 16 U/L (ref 0–35)
AST: 24 U/L (ref 0–37)
Albumin: 4.5 g/dL (ref 3.5–5.2)
Alkaline Phosphatase: 92 U/L (ref 39–117)
Bilirubin, Direct: 0.1 mg/dL (ref 0.0–0.3)
Total Bilirubin: 0.6 mg/dL (ref 0.2–1.2)
Total Protein: 6.9 g/dL (ref 6.0–8.3)

## 2021-10-20 LAB — HEMOGLOBIN A1C: Hgb A1c MFr Bld: 5.5 % (ref 4.6–6.5)

## 2021-10-24 ENCOUNTER — Encounter: Payer: Self-pay | Admitting: Internal Medicine

## 2021-10-24 ENCOUNTER — Ambulatory Visit (INDEPENDENT_AMBULATORY_CARE_PROVIDER_SITE_OTHER): Payer: PPO | Admitting: Internal Medicine

## 2021-10-24 VITALS — BP 115/68 | HR 88 | Temp 98.2°F | Ht 63.0 in | Wt 113.2 lb

## 2021-10-24 DIAGNOSIS — I10 Essential (primary) hypertension: Secondary | ICD-10-CM | POA: Diagnosis not present

## 2021-10-24 DIAGNOSIS — E7800 Pure hypercholesterolemia, unspecified: Secondary | ICD-10-CM

## 2021-10-24 DIAGNOSIS — R739 Hyperglycemia, unspecified: Secondary | ICD-10-CM

## 2021-10-24 DIAGNOSIS — F439 Reaction to severe stress, unspecified: Secondary | ICD-10-CM | POA: Diagnosis not present

## 2021-10-24 DIAGNOSIS — Z1231 Encounter for screening mammogram for malignant neoplasm of breast: Secondary | ICD-10-CM

## 2021-10-24 NOTE — Progress Notes (Signed)
Patient ID: Claire Clayton, female   DOB: 01/02/1932, 88 y.o.   MRN: [id number] ? ? ?Subjective:  ? ? Patient ID: Claire Clayton, female    DOB: May 27, 1932, 88 y.o.   MRN: [id number] ? ?This visit occurred during the SARS-CoV-2 public health emergency.  Safety protocols were in place, including screening questions prior to the visit, additional usage of staff PPE, and extensive cleaning of exam room while observing appropriate contact time as indicated for disinfecting solutions.  ? ?Patient here for a scheduled follow up.  ? ?Chief Complaint  ?Patient presents with  ? Follow-up  ? .  ? ?HPI ?Here to follow up regarding her blood pressure, increased stress and cholesterol. Reports she is doing relatively well.  Increased stress.  Discussed.  Has good support.  Does not feel needs any further intervention. No chest pain or sob reported.  No abdominal pain.  Bowels moving.  Reviewed outside blood pressures - averaging 106120/60-80.  ? ? ?Past Medical History:  ?Diagnosis Date  ? Cancer [facility])   ? squamous cell skin  ? Diverticulosis   ? Glaucoma   ? Hypertension   ? Osteoporosis   ? Seasonal allergies   ? ?Past Surgical History:  ?Procedure Laterality Date  ? BREAST EXCISIONAL BIOPSY Left 1970  ? benign  ? CATARACT EXTRACTION, BILATERAL    ? SKIN CANCER EXCISION    ? carcinoma (3 surgeries)  ? TONSILLECTOMY  1948  ? ?Family History  ?Problem Relation Age of Onset  ? Colon cancer Brother   ? Colon polyps Sister   ? Breast cancer Neg Hx   ? ?Social History  ? ?Socioeconomic History  ? Marital status: Widowed  ?  Spouse name: Not on file  ? Number of children: 3  ? Years of education: Not on file  ? Highest education level: Not on file  ?Occupational History  ? Not on file  ?Tobacco Use  ? Smoking status: Never  ? Smokeless tobacco: Never  ?Substance and Sexual Activity  ? Alcohol use: Yes  ?  Alcohol/week: 0.0 standard drinks  ?  Comment: rare - glass of wine  ? Drug use: No  ? Sexual activity: Never  ?Other Topics  Concern  ? Not on file  ?Social History Narrative  ? Not on file  ? ?Social Determinants of Health  ? ?Financial Resource Strain: Not on file  ?Food Insecurity: Not on file  ?Transportation Needs: Not on file  ?Physical Activity: Not on file  ?Stress: Not on file  ?Social Connections: Not on file  ? ? ? ?Review of Systems  ?Constitutional:  Negative for appetite change and unexpected weight change.  ?HENT:  Negative for congestion.   ?Respiratory:  Negative for cough, chest tightness and shortness of breath.   ?Cardiovascular:  Negative for chest pain, palpitations and leg swelling.  ?Gastrointestinal:  Negative for diarrhea, nausea and vomiting.  ?Genitourinary:  Negative for difficulty urinating and dysuria.  ?Musculoskeletal:  Negative for joint swelling and myalgias.  ?Skin:  Negative for color change and rash.  ?Neurological:  Negative for dizziness, light-headedness and headaches.  ?Psychiatric/Behavioral:  Negative for agitation and dysphoric mood.   ? ?   ?Objective:  ?  ? ?BP 115/68   Pulse 88   Temp 98.2 ?F (36.8 ?C) (Rectal)   Ht '5\' 3"'$  (1.6 m)   Wt 113 lb 3.2 oz (51.3 kg)   SpO2 97%   BMI 20.05 kg/m?  ?Wt Readings from Last 3 Encounters:  ?  10/24/21 113 lb 3.2 oz (51.3 kg)  ?10/13/21 113 lb (51.3 kg)  ?05/10/21 113 lb 9.6 oz (51.5 kg)  ? ? ?Physical Exam ?Vitals reviewed.  ?Constitutional:   ?   General: She is not in acute distress. ?   Appearance: Normal appearance.  ?HENT:  ?   Head: Normocephalic and atraumatic.  ?   Right Ear: External ear normal.  ?   Left Ear: External ear normal.  ?Eyes:  ?   General: No scleral icterus.    ?   Right eye: No discharge.     ?   Left eye: No discharge.  ?   Conjunctiva/sclera: Conjunctivae normal.  ?Neck:  ?   Thyroid: No thyromegaly.  ?Cardiovascular:  ?   Rate and Rhythm: Normal rate and regular rhythm.  ?Pulmonary:  ?   Effort: No respiratory distress.  ?   Breath sounds: Normal breath sounds. No wheezing.  ?Abdominal:  ?   General: Bowel sounds are normal.   ?   Palpations: Abdomen is soft.  ?   Tenderness: There is no abdominal tenderness.  ?Musculoskeletal:     ?   General: No swelling or tenderness.  ?   Cervical back: Neck supple. No tenderness.  ?Lymphadenopathy:  ?   Cervical: No cervical adenopathy.  ?Skin: ?   Findings: No erythema or rash.  ?Neurological:  ?   Mental Status: She is alert.  ?Psychiatric:     ?   Mood and Affect: Mood normal.     ?   Behavior: Behavior normal.  ? ? ? ?Outpatient Encounter Medications as of 10/24/2021  ?Medication Sig  ? amLODipine (NORVASC) 2.5 MG tablet Take 1 tablet (2.5 mg total) by mouth daily.  ? aspirin EC 81 MG tablet Take 81 mg by mouth daily.  ? atenolol (TENORMIN) 25 MG tablet Take 1 tablet (25 mg total) by mouth daily.  ? busPIRone (BUSPAR) 5 MG tablet Take 1 tablet (5 mg total) by mouth daily as needed.  ? Calcium Carbonate-Vitamin D 600-400 MG-UNIT per tablet Take 1 tablet by mouth daily.   ? hydrocortisone (CORTEF) 10 MG tablet SWISH AND EXPECTORATE TWO TEASPOONFULS THREE TIMES A DAY.  ? latanoprost (XALATAN) 0.005 % ophthalmic solution SMARTSIG:In Eye(s)  ? lisinopril (ZESTRIL) 20 MG tablet Take 1 tablet (20 mg total) by mouth daily.  ? Multiple Vitamins-Minerals (ICAPS PO) Take by mouth.  ? mupirocin ointment (BACTROBAN) 2 % Apply to affected area twicd a day  ? Niacin (VITAMIN B-3 PO) Take by mouth.  ? timolol (BETIMOL) 0.5 % ophthalmic solution Place 1 drop into both eyes 2 (two) times daily.  ? triamcinolone cream (KENALOG) 0.1 % Apply topically.  ? valACYclovir (VALTREX) 1000 MG tablet Take 2 tablets twice a day for 1 day with each outbreak  ? [DISCONTINUED] Travoprost, BAK Free, (TRAVATAN) 0.004 % SOLN ophthalmic solution 1 drop at bedtime.  ? ?No facility-administered encounter medications on file as of 10/24/2021.  ?  ? ?Lab Results  ?Component Value Date  ? WBC 6.6 12/03/2020  ? HGB 14.8 12/03/2020  ? HCT 41.9 12/03/2020  ? PLT 217.0 12/03/2020  ? GLUCOSE 107 (H) 10/20/2021  ? CHOL 177 10/20/2021  ? TRIG 76.0  10/20/2021  ? HDL 66.10 10/20/2021  ? [place] 96 10/20/2021  ? ALT 16 10/20/2021  ? AST 24 10/20/2021  ? NA 138 10/20/2021  ? K 4.6 10/20/2021  ? CL 101 10/20/2021  ? CREATININE 0.85 10/20/2021  ? BUN 16 10/20/2021  ?  CO2 30 10/20/2021  ? TSH 6.27 (H) 05/06/2021  ? INR 0.9 06/05/2018  ? HGBA1C 5.5 10/20/2021  ? ? ?DG Bone Density ? ?Result Date: 02/08/2021 ?EXAM: DUAL X-RAY ABSORPTIOMETRY (DXA) FOR BONE MINERAL DENSITY IMPRESSION: Your patient Charnese Federici completed a BMD test on 02/08/2021 using the [place] (software version: 14.10) manufactured by [employer]. The following summarizes the results of our evaluation. Technologist: PATIENT BIOGRAPHICAL: Name: Quentina, Fronek Patient ID: [id number] Birth Date: 1931-12-09 Height: 63.0 in. Gender: Female Exam Date: 02/08/2021 Weight: 110.6 lbs. Indications: Advanced Age, Caucasian, Height Loss, History of Fracture (Adult), Osteoporotic, Parent Hip Fracture, Postmenopausal, Vitamin D Deficiency Fractures: Facial Bone, Left foot Treatments: calcium w/ vit D, Multi-Vitamin, [place], Vitamin D DENSITOMETRY RESULTS: Site      Region     Measured Date Measured Age WHO Classification Young Adult T-score BMD         %Change vs. Previous Significant Change (*) AP Spine L1-L3 02/08/2021 87.0 Osteoporosis -3.8 0.713 g/cm2 2.9% - AP Spine L1-L3 08/22/2018 84.5 Osteoporosis -4.0 0.693 g/cm2 -8.3% Yes AP Spine L1-L3 06/23/2015 81.4 Osteoporosis -3.5 0.756 g/cm2 - - DualFemur Neck Right 02/08/2021 87.0 Osteoporosis -2.7 0.663 g/cm2 -4.7% - DualFemur Neck Right 08/22/2018 84.5 Osteoporosis -2.5 0.696 g/cm2 -3.7% - DualFemur Neck Right 06/23/2015 81.4 Osteopenia -2.3 0.723 g/cm2 - - DualFemur Total Mean 02/08/2021 87.0 Osteoporosis -2.5 0.688 g/cm2 -4.2% Yes DualFemur Total Mean 08/22/2018 84.5 Osteopenia -2.3 0.718 g/cm2 -4.6% Yes DualFemur Total Mean 06/23/2015 81.4 Osteopenia -2.0 0.753 g/cm2 - - ASSESSMENT: The BMD measured at AP Spine L1-L3 is 0.713  g/cm2 with a T-score of -3.8. This patient is considered osteoporotic according to [place] [facility]) criteria. The scan quality is good. Compared with prior study, there has been no significa

## 2021-10-30 ENCOUNTER — Encounter: Payer: Self-pay | Admitting: Internal Medicine

## 2021-10-30 NOTE — Assessment & Plan Note (Signed)
Follow met b and a1c.  ?

## 2021-10-30 NOTE — Assessment & Plan Note (Signed)
Blood pressure elevated here in the office.  Reviewed outside readings - 106-120/60-80  Continue atenolol, amlodipine and lisinopril. Tolerating.  Follow pressures.  Follow metabolic panel. Have discussed adjusting medication doses.  Wants to continue to monitor. Readings under control outside - so will hold on changing medication.  Follow.  ?

## 2021-10-30 NOTE — Assessment & Plan Note (Signed)
Have discussed calculated cholesterol risk.  Desires no medication.  Follow lipid panel.  

## 2021-10-30 NOTE — Assessment & Plan Note (Signed)
Increased stress as outlined.  Discussed.  Does not feel needs any further intervention.  Follow.  

## 2021-11-22 DIAGNOSIS — Z79899 Other long term (current) drug therapy: Secondary | ICD-10-CM | POA: Diagnosis not present

## 2021-11-22 DIAGNOSIS — L820 Inflamed seborrheic keratosis: Secondary | ICD-10-CM | POA: Diagnosis not present

## 2021-11-22 DIAGNOSIS — Z7982 Long term (current) use of aspirin: Secondary | ICD-10-CM | POA: Diagnosis not present

## 2021-11-22 DIAGNOSIS — L570 Actinic keratosis: Secondary | ICD-10-CM | POA: Diagnosis not present

## 2021-11-22 DIAGNOSIS — D492 Neoplasm of unspecified behavior of bone, soft tissue, and skin: Secondary | ICD-10-CM | POA: Diagnosis not present

## 2021-11-22 DIAGNOSIS — Z85828 Personal history of other malignant neoplasm of skin: Secondary | ICD-10-CM | POA: Diagnosis not present

## 2021-11-22 DIAGNOSIS — L858 Other specified epidermal thickening: Secondary | ICD-10-CM | POA: Diagnosis not present

## 2021-11-26 ENCOUNTER — Encounter: Payer: Self-pay | Admitting: Internal Medicine

## 2021-11-26 DIAGNOSIS — L858 Other specified epidermal thickening: Secondary | ICD-10-CM | POA: Insufficient documentation

## 2021-12-15 DIAGNOSIS — H40153 Residual stage of open-angle glaucoma, bilateral: Secondary | ICD-10-CM | POA: Diagnosis not present

## 2022-01-24 ENCOUNTER — Telehealth: Payer: Self-pay | Admitting: Internal Medicine

## 2022-01-24 NOTE — Telephone Encounter (Signed)
Appointment Request From: Claire Clayton    With Provider: Einar Pheasant, MD [facility] [place]]    Preferred Date Range: Any    Preferred Times: Any Time    Reason for visit: Request an Appointment    Comments:  Diarrhea and some bleeding afterwards. Diarrhea yesterday and show of blood today   Appointment Request (Newest Message First) Claire Clayton  P [facility] Admin Pool 4 hours ago (12:44 PM)   BS Appointment Request From: Claire Clayton   With Provider: Einar Pheasant, MD [facility] [place]]   Preferred Date Range: Any   Preferred Times: Any Time   Reason for visit: Request an Appointment   Comments: Diarrhea and some bleeding afterwards. Diarrhea yesterday and show of blood today

## 2022-01-24 NOTE — Telephone Encounter (Signed)
Spoke with pt and she stated that she has episodes of diarrhea here and there, but yesterday was the first time that she noticed the blood in the toilet after having the diarrhea. Pt stated that today she has only noticed the blood when wiping. She stated she is not having any abdominal pain, no fever, no nausea or vomiting. She stated that she is eating a bland diet today and drinking normal amounts of fluid. She stated that the diarrhea is much better today. The blood in the toilet yesterday she stated seemed to be darker then what is on the toilet paper when she wipes. I have scheduled pt in the only available appt for this week and that is Friday at 3 pm with Dr. Nicki Reaper. Pt stated that she thinks she will be fine till then since the blood and diarrhea is less today.

## 2022-01-24 NOTE — Telephone Encounter (Signed)
Called and spoke to Claire Clayton.  Doing better.  No abdominal pain.  Eating.  Bleeding has subsided.  Please change her appt to Friday am 7:30.  Pt aware.  Please block the 3:00 spot.  Please call pt and confirm doing ok.

## 2022-01-25 NOTE — Telephone Encounter (Signed)
S/w pt - stated so far so good Has started incorporating more solid food today, seems to be doing fine. Pt confirmed no fever, abd pain, nausea, vomiting, bleeding.  Confirmed 730 appt for Friday.   Pt advised to call if anything changes.

## 2022-01-25 NOTE — Telephone Encounter (Signed)
Appt moved

## 2022-01-27 ENCOUNTER — Encounter: Payer: Self-pay | Admitting: Internal Medicine

## 2022-01-27 ENCOUNTER — Ambulatory Visit (INDEPENDENT_AMBULATORY_CARE_PROVIDER_SITE_OTHER): Payer: PPO | Admitting: Internal Medicine

## 2022-01-27 DIAGNOSIS — R739 Hyperglycemia, unspecified: Secondary | ICD-10-CM | POA: Diagnosis not present

## 2022-01-27 DIAGNOSIS — I10 Essential (primary) hypertension: Secondary | ICD-10-CM | POA: Diagnosis not present

## 2022-01-27 DIAGNOSIS — K625 Hemorrhage of anus and rectum: Secondary | ICD-10-CM | POA: Diagnosis not present

## 2022-01-27 DIAGNOSIS — Z800 Family history of malignant neoplasm of digestive organs: Secondary | ICD-10-CM | POA: Diagnosis not present

## 2022-01-27 LAB — CBC WITH DIFFERENTIAL/PLATELET
Basophils Absolute: 0 10*3/uL (ref 0.0–0.1)
Basophils Relative: 0.8 % (ref 0.0–3.0)
Eosinophils Absolute: 0.2 10*3/uL (ref 0.0–0.7)
Eosinophils Relative: 3.2 % (ref 0.0–5.0)
HCT: 41.8 % (ref 36.0–46.0)
Hemoglobin: 14.6 g/dL (ref 12.0–15.0)
Lymphocytes Relative: 27.7 % (ref 12.0–46.0)
Lymphs Abs: 1.6 10*3/uL (ref 0.7–4.0)
MCHC: 34.9 g/dL (ref 30.0–36.0)
MCV: 93.1 fl (ref 78.0–100.0)
Monocytes Absolute: 0.6 10*3/uL (ref 0.1–1.0)
Monocytes Relative: 10.8 % (ref 3.0–12.0)
Neutro Abs: 3.3 10*3/uL (ref 1.4–7.7)
Neutrophils Relative %: 57.5 % (ref 43.0–77.0)
Platelets: 228 10*3/uL (ref 150.0–400.0)
RBC: 4.49 Mil/uL (ref 3.87–5.11)
RDW: 13 % (ref 11.5–15.5)
WBC: 5.7 10*3/uL (ref 4.0–10.5)

## 2022-01-27 LAB — BASIC METABOLIC PANEL
BUN: 12 mg/dL (ref 6–23)
CO2: 26 mEq/L (ref 19–32)
Calcium: 9.3 mg/dL (ref 8.4–10.5)
Chloride: 103 mEq/L (ref 96–112)
Creatinine, Ser: 0.75 mg/dL (ref 0.40–1.20)
GFR: 71.29 mL/min (ref >60.00)
Glucose, Bld: 109 mg/dL — ABNORMAL HIGH (ref 70–99)
Potassium: 4.4 mEq/L (ref 3.5–5.1)
Sodium: 138 mEq/L (ref 135–145)

## 2022-01-27 NOTE — Progress Notes (Signed)
Patient ID: Claire Clayton, female   DOB: 26-Feb-1932, 88 y.o.   MRN: [id number]   Subjective:    Patient ID: Claire Clayton, female    DOB: 1931/07/09, 88 y.o.   MRN: [id number]   Patient here for work in appt  Chief Complaint  Patient presents with   Diarrhea   Rectal Bleeding   .   HPI Had been having some intermittent diarrhea.  Noticed 01/23/22 - noticed blood in the toilet after diarrhea.  Noticed blood when wiping.  No fever.  No nausea or vomiting.  States has not noticed blood for the last few days.  Has been eating bland foods - yogurt, crackers and drinking.  In the last 24 hours has advanced diet - cream potatoes, lima beans, rolls, pimento cheese and fruit.  Feeling better now.  No chest pain or sob.  Outside blood pressures reviewed: mostly range 115-120s/70s.  No headache or dizziness.  No light headedness.    Past Medical History:  Diagnosis Date   Cancer ([place])    squamous cell skin   Diverticulosis    Glaucoma    Hypertension    Osteoporosis    Seasonal allergies    Past Surgical History:  Procedure Laterality Date   BREAST EXCISIONAL BIOPSY Left 1970   benign   CATARACT EXTRACTION, BILATERAL     SKIN CANCER EXCISION     carcinoma (3 surgeries)   TONSILLECTOMY  1948   Family History  Problem Relation Age of Onset   Colon cancer Brother    Colon polyps Sister    Breast cancer Neg Hx    Social History   Socioeconomic History   Marital status: Widowed    Spouse name: Not on file   Number of children: 3   Years of education: Not on file   Highest education level: Not on file  Occupational History   Not on file  Tobacco Use   Smoking status: Never   Smokeless tobacco: Never  Substance and Sexual Activity   Alcohol use: Yes    Alcohol/week: 0.0 standard drinks of alcohol    Comment: rare - glass of wine   Drug use: No   Sexual activity: Never  Other Topics Concern   Not on file  Social History Narrative   Not on file   Social  Determinants of Health   Financial Resource Strain: Low Risk  (10/12/2020)   Overall Financial Resource Strain (CARDIA)    Difficulty of Paying Living Expenses: Not hard at all  Food Insecurity: No Food Insecurity (10/12/2020)   Hunger Vital Sign    Worried About Running Out of Food in the Last Year: Never true    Ran Out of Food in the Last Year: Never true  Transportation Needs: No Transportation Needs (10/12/2020)   PRAPARE - Hydrologist (Medical): No    Lack of Transportation (Non-Medical): No  Physical Activity: Not on file  Stress: No Stress Concern Present (10/12/2020)   [place]    Feeling of Stress : Not at all  Social Connections: Unknown (10/12/2020)   Social Connection and Isolation Panel [NHANES]    Frequency of Communication with Friends and Family: More than three times a week    Frequency of Social Gatherings with Friends and Family: More than three times a week    Attends Religious Services: More than 4 times per year    Active Member of Clubs or  Organizations: Not on file    Attends Archivist Meetings: Not on file    Marital Status: Not on file     Review of Systems  Constitutional:  Negative for appetite change and unexpected weight change.  HENT:  Negative for congestion and sinus pressure.   Respiratory:  Negative for cough, chest tightness and shortness of breath.   Cardiovascular:  Negative for chest pain, palpitations and leg swelling.  Gastrointestinal:  Positive for blood in stool and diarrhea. Negative for vomiting.  Genitourinary:  Negative for difficulty urinating and dysuria.  Musculoskeletal:  Negative for joint swelling and myalgias.  Skin:  Negative for color change and rash.  Neurological:  Negative for dizziness and headaches.  Psychiatric/Behavioral:  Negative for agitation and dysphoric mood.        Objective:     BP 138/88 (BP  Location: Left Arm, Patient Position: Sitting, Cuff Size: Small)   Pulse 76   Temp 98.1 F (36.7 C) (Temporal)   Resp 16   Ht $R'5\' 3"'yh$  (1.6 m)   Wt 110 lb 9.6 oz (50.2 kg)   SpO2 97%   BMI 19.59 kg/m  Wt Readings from Last 3 Encounters:  01/27/22 110 lb 9.6 oz (50.2 kg)  10/24/21 113 lb 3.2 oz (51.3 kg)  10/13/21 113 lb (51.3 kg)    Physical Exam Vitals reviewed.  Constitutional:      General: She is not in acute distress.    Appearance: Normal appearance.  HENT:     Head: Normocephalic and atraumatic.     Right Ear: External ear normal.     Left Ear: External ear normal.  Eyes:     General: No scleral icterus.       Right eye: No discharge.        Left eye: No discharge.     Conjunctiva/sclera: Conjunctivae normal.  Neck:     Thyroid: No thyromegaly.  Cardiovascular:     Rate and Rhythm: Normal rate and regular rhythm.  Pulmonary:     Effort: No respiratory distress.     Breath sounds: Normal breath sounds. No wheezing.  Abdominal:     General: Bowel sounds are normal.     Palpations: Abdomen is soft.     Tenderness: There is no abdominal tenderness.  Musculoskeletal:        General: No swelling or tenderness.     Cervical back: Neck supple. No tenderness.  Lymphadenopathy:     Cervical: No cervical adenopathy.  Skin:    Findings: No erythema or rash.  Neurological:     Mental Status: She is alert.  Psychiatric:        Mood and Affect: Mood normal.        Behavior: Behavior normal.      Outpatient Encounter Medications as of 01/27/2022  Medication Sig   amLODipine (NORVASC) 2.5 MG tablet Take 1 tablet (2.5 mg total) by mouth daily.   aspirin EC 81 MG tablet Take 81 mg by mouth daily.   atenolol (TENORMIN) 25 MG tablet Take 1 tablet (25 mg total) by mouth daily.   busPIRone (BUSPAR) 5 MG tablet Take 1 tablet (5 mg total) by mouth daily as needed.   Calcium Carbonate-Vitamin D 600-400 MG-UNIT per tablet Take 1 tablet by mouth daily.    hydrocortisone  (CORTEF) 10 MG tablet SWISH AND EXPECTORATE TWO TEASPOONFULS THREE TIMES A DAY.   latanoprost (XALATAN) 0.005 % ophthalmic solution SMARTSIG:In Eye(s)   lisinopril (ZESTRIL) 20 MG tablet Take  1 tablet (20 mg total) by mouth daily.   Multiple Vitamins-Minerals (ICAPS PO) Take by mouth.   mupirocin ointment (BACTROBAN) 2 % Apply to affected area twicd a day   Niacin (VITAMIN B-3 PO) Take by mouth.   timolol (BETIMOL) 0.5 % ophthalmic solution Place 1 drop into both eyes 2 (two) times daily.   triamcinolone cream (KENALOG) 0.1 % Apply topically.   valACYclovir (VALTREX) 1000 MG tablet Take 2 tablets twice a day for 1 day with each outbreak   No facility-administered encounter medications on file as of 01/27/2022.     Lab Results  Component Value Date   WBC 5.7 01/27/2022   HGB 14.6 01/27/2022   HCT 41.8 01/27/2022   PLT 228.0 01/27/2022   GLUCOSE 109 (H) 01/27/2022   CHOL 177 10/20/2021   TRIG 76.0 10/20/2021   HDL 66.10 10/20/2021   LDLCALC 96 10/20/2021   ALT 16 10/20/2021   AST 24 10/20/2021   NA 138 01/27/2022   K 4.4 01/27/2022   CL 103 01/27/2022   CREATININE 0.75 01/27/2022   BUN 12 01/27/2022   CO2 26 01/27/2022   TSH 6.27 (H) 05/06/2021   INR 0.9 06/05/2018   HGBA1C 5.5 10/20/2021    Mammogram 3D SCREEN BREAST BILATERAL  Result Date: 02/09/2021 CLINICAL DATA:  Screening. EXAM: DIGITAL SCREENING BILATERAL MAMMOGRAM WITH TOMOSYNTHESIS AND CAD TECHNIQUE: Bilateral screening digital craniocaudal and mediolateral oblique mammograms were obtained. Bilateral screening digital breast tomosynthesis was performed. The images were evaluated with computer-aided detection. COMPARISON:  Previous exam(s). ACR Breast Density Category c: The breast tissue is heterogeneously dense, which may obscure small masses. FINDINGS: There are no findings suspicious for malignancy. IMPRESSION: No mammographic evidence of malignancy. A result letter of this screening mammogram will be mailed directly  to the patient. RECOMMENDATION: Screening mammogram in one year. (Code:SM-B-01Y) BI-RADS CATEGORY  1: Negative. Electronically Signed   By: Lajean Manes M.D.   On: 02/09/2021 15:39  DG Bone Density  Result Date: 02/08/2021 EXAM: DUAL X-RAY ABSORPTIOMETRY (DXA) FOR BONE MINERAL DENSITY IMPRESSION: Your patient Evah Rashid completed a BMD test on 02/08/2021 using the [employer] (software version: 14.10) manufactured by [employer]. The following summarizes the results of our evaluation. Technologist: PATIENT BIOGRAPHICAL: Name: Koree, Staheli Patient ID: [id number] Birth Date: 14-May-1932 Height: 63.0 in. Gender: Female Exam Date: 02/08/2021 Weight: 110.6 lbs. Indications: Advanced Age, Caucasian, Height Loss, History of Fracture (Adult), Osteoporotic, Parent Hip Fracture, Postmenopausal, Vitamin D Deficiency Fractures: Facial Bone, Left foot Treatments: calcium w/ vit D, Multi-Vitamin, Oscal, Vitamin D DENSITOMETRY RESULTS: Site      Region     Measured Date Measured Age WHO Classification Young Adult T-score BMD         %Change vs. Previous Significant Change (*) AP Spine L1-L3 4/08/2023 87.0 Osteoporosis -3.8 0.713 g/cm2 2.9% - AP Spine L1-L3 08/22/2018 84.5 Osteoporosis -4.0 0.693 g/cm2 -8.3% Yes AP Spine L1-L3 06/23/2015 81.4 Osteoporosis -3.5 0.756 g/cm2 - - DualFemur Neck Right 02/08/2021 87.0 Osteoporosis -2.7 0.663 g/cm2 -4.7% - DualFemur Neck Right 08/22/2018 84.5 Osteoporosis -2.5 0.696 g/cm2 -3.7% - DualFemur Neck Right 06/23/2015 81.4 Osteopenia -2.3 0.723 g/cm2 - - DualFemur Total Mean 02/08/2021 87.0 Osteoporosis -2.5 0.688 g/cm2 -4.2% Yes DualFemur Total Mean 08/22/2018 84.5 Osteopenia -2.3 0.718 g/cm2 -4.6% Yes DualFemur Total Mean 06/23/2015 81.4 Osteopenia -2.0 0.753 g/cm2 - - ASSESSMENT: The BMD measured at AP Spine L1-L3 is 0.713 g/cm2 with a T-score of -3.8. This patient is considered osteoporotic according to  World Health Organization [facility]) criteria. The  scan quality is good. Compared with prior study, there has been no significant change in the spine. Compared with prior study, there has been a significant decrease in the total hip. World Pharmacologist [facility]) criteria for post-menopausal, Caucasian Women: Normal:                   T-score at or above -1 SD Osteopenia/low bone mass: T-score between -1 and -2.5 SD Osteoporosis:             T-score at or below -2.5 SD RECOMMENDATIONS: 1. All patients should optimize calcium and vitamin D intake. 2. Consider FDA-approved medical therapies in postmenopausal women and men aged 24 years and older, based on the following: a. A hip or vertebral(clinical or morphometric) fracture b. T-score < -2.5 at the femoral neck or spine after appropriate evaluation to exclude secondary causes c. Low bone mass (T-score between -1.0 and -2.5 at the femoral neck or spine) and a 10-year probability of a hip fracture > 3% or a 10-year probability of a major osteoporosis-related fracture > 20% based on the [place]-adapted WHO algorithm 3. Clinician judgment and/or patient preferences may indicate treatment for people with 10-year fracture probabilities above or below these levels FOLLOW-UP: People with diagnosed cases of osteoporosis or at high risk for fracture should have regular bone mineral density tests. For patients eligible for Medicare, routine testing is allowed once every 2 years. The testing frequency can be increased to one year for patients who have rapidly progressing disease, those who are receiving or discontinuing medical therapy to restore bone mass, or have additional risk factors. I have reviewed this report, and agree with the above findings. Mark A. Thornton Papas, M.D. [facility] . Electronically Signed   By: Lavonia Dana M.D.   On: 02/08/2021 15:00       Assessment & Plan:   Problem List Items Addressed This Visit     Essential hypertension, benign    Reviewed outside blood pressures as outlined.   Continue atenolol, amlodipine and lisinopril.  Follow pressures.        Family history of colon cancer    Last colonoscopy 05/2009 - diverticulosis and internal hemorrhoids.  Discussed further GI w/up and evaluation given recent bleeding.  Wanted to hold on referral.       Hyperglycemia    Follow met b and a1c.       Rectal bleeding    Recent rectal bleeding as outlined.  Has had intermittent episodes of diarrhea as outlined.  No diarrhea now.  Eating - advancing diet.  No abdominal pain.  No further bleeding.  Discussed further w/up and evaluation.  Check cbc.  Discussed referral to GI.  Wants to hold on referral. Follow closely.  Discussed benefiber.       Relevant Orders   CBC with Differential/Platelet (Completed)   Basic metabolic panel (Completed)     Einar Pheasant, MD

## 2022-02-06 ENCOUNTER — Encounter: Payer: Self-pay | Admitting: Internal Medicine

## 2022-02-06 NOTE — Assessment & Plan Note (Signed)
Follow met b and a1c.  

## 2022-02-06 NOTE — Assessment & Plan Note (Addendum)
Last colonoscopy 05/2009 - diverticulosis and internal hemorrhoids.  Discussed further GI w/up and evaluation given recent bleeding.  Wanted to hold on referral.

## 2022-02-06 NOTE — Assessment & Plan Note (Addendum)
Recent rectal bleeding as outlined.  Has had intermittent episodes of diarrhea as outlined.  No diarrhea now.  Eating - advancing diet.  No abdominal pain.  No further bleeding.  Discussed further w/up and evaluation.  Check cbc.  Discussed referral to GI.  Wants to hold on referral. Follow closely.  Discussed benefiber.

## 2022-02-06 NOTE — Assessment & Plan Note (Signed)
Reviewed outside blood pressures as outlined.  Continue atenolol, amlodipine and lisinopril.  Follow pressures.

## 2022-02-07 DIAGNOSIS — D492 Neoplasm of unspecified behavior of bone, soft tissue, and skin: Secondary | ICD-10-CM | POA: Diagnosis not present

## 2022-02-07 DIAGNOSIS — L570 Actinic keratosis: Secondary | ICD-10-CM | POA: Diagnosis not present

## 2022-02-07 DIAGNOSIS — L814 Other melanin hyperpigmentation: Secondary | ICD-10-CM | POA: Diagnosis not present

## 2022-02-07 DIAGNOSIS — Z85828 Personal history of other malignant neoplasm of skin: Secondary | ICD-10-CM | POA: Diagnosis not present

## 2022-02-07 DIAGNOSIS — L821 Other seborrheic keratosis: Secondary | ICD-10-CM | POA: Diagnosis not present

## 2022-02-07 DIAGNOSIS — L858 Other specified epidermal thickening: Secondary | ICD-10-CM | POA: Diagnosis not present

## 2022-02-21 ENCOUNTER — Other Ambulatory Visit (INDEPENDENT_AMBULATORY_CARE_PROVIDER_SITE_OTHER): Payer: PPO

## 2022-02-21 DIAGNOSIS — R739 Hyperglycemia, unspecified: Secondary | ICD-10-CM | POA: Diagnosis not present

## 2022-02-21 DIAGNOSIS — E7800 Pure hypercholesterolemia, unspecified: Secondary | ICD-10-CM

## 2022-02-21 DIAGNOSIS — I10 Essential (primary) hypertension: Secondary | ICD-10-CM

## 2022-02-21 LAB — TSH: TSH: 5.05 u[IU]/mL (ref 0.35–5.50)

## 2022-02-21 LAB — LIPID PANEL
Cholesterol: 181 mg/dL (ref 0–200)
HDL: 62 mg/dL (ref >39.00)
LDL Cholesterol: 103 mg/dL — ABNORMAL HIGH (ref 0–99)
NonHDL: 119.47
Total CHOL/HDL Ratio: 3
Triglycerides: 80 mg/dL (ref 0.0–149.0)
VLDL: 16 mg/dL (ref 0.0–40.0)

## 2022-02-21 LAB — CBC WITH DIFFERENTIAL/PLATELET
Basophils Absolute: 0 10*3/uL (ref 0.0–0.1)
Basophils Relative: 0.5 % (ref 0.0–3.0)
Eosinophils Absolute: 0.2 10*3/uL (ref 0.0–0.7)
Eosinophils Relative: 3 % (ref 0.0–5.0)
HCT: 41.9 % (ref 36.0–46.0)
Hemoglobin: 14.2 g/dL (ref 12.0–15.0)
Lymphocytes Relative: 35.5 % (ref 12.0–46.0)
Lymphs Abs: 2 10*3/uL (ref 0.7–4.0)
MCHC: 34 g/dL (ref 30.0–36.0)
MCV: 95.1 fl (ref 78.0–100.0)
Monocytes Absolute: 0.6 10*3/uL (ref 0.1–1.0)
Monocytes Relative: 10.1 % (ref 3.0–12.0)
Neutro Abs: 2.9 10*3/uL (ref 1.4–7.7)
Neutrophils Relative %: 50.9 % (ref 43.0–77.0)
Platelets: 211 10*3/uL (ref 150.0–400.0)
RBC: 4.41 Mil/uL (ref 3.87–5.11)
RDW: 13.5 % (ref 11.5–15.5)
WBC: 5.6 10*3/uL (ref 4.0–10.5)

## 2022-02-21 LAB — BASIC METABOLIC PANEL
BUN: 14 mg/dL (ref 6–23)
CO2: 29 mEq/L (ref 19–32)
Calcium: 9.4 mg/dL (ref 8.4–10.5)
Chloride: 103 mEq/L (ref 96–112)
Creatinine, Ser: 0.79 mg/dL (ref 0.40–1.20)
GFR: 66.95 mL/min (ref >60.00)
Glucose, Bld: 111 mg/dL — ABNORMAL HIGH (ref 70–99)
Potassium: 4.3 mEq/L (ref 3.5–5.1)
Sodium: 140 mEq/L (ref 135–145)

## 2022-02-21 LAB — HEPATIC FUNCTION PANEL
ALT: 15 U/L (ref 0–35)
AST: 21 U/L (ref 0–37)
Albumin: 4.1 g/dL (ref 3.5–5.2)
Alkaline Phosphatase: 83 U/L (ref 39–117)
Bilirubin, Direct: 0.1 mg/dL (ref 0.0–0.3)
Total Bilirubin: 0.5 mg/dL (ref 0.2–1.2)
Total Protein: 6.5 g/dL (ref 6.0–8.3)

## 2022-02-21 LAB — HEMOGLOBIN A1C: Hgb A1c MFr Bld: 5.9 % (ref 4.6–6.5)

## 2022-02-24 ENCOUNTER — Ambulatory Visit (INDEPENDENT_AMBULATORY_CARE_PROVIDER_SITE_OTHER): Payer: PPO | Admitting: Internal Medicine

## 2022-02-24 ENCOUNTER — Encounter: Payer: Self-pay | Admitting: Internal Medicine

## 2022-02-24 VITALS — BP 170/90 | HR 85 | Temp 98.1°F | Resp 14 | Ht 63.0 in | Wt 111.4 lb

## 2022-02-24 DIAGNOSIS — Z23 Encounter for immunization: Secondary | ICD-10-CM | POA: Diagnosis not present

## 2022-02-24 DIAGNOSIS — I10 Essential (primary) hypertension: Secondary | ICD-10-CM | POA: Diagnosis not present

## 2022-02-24 DIAGNOSIS — R198 Other specified symptoms and signs involving the digestive system and abdomen: Secondary | ICD-10-CM

## 2022-02-24 DIAGNOSIS — R739 Hyperglycemia, unspecified: Secondary | ICD-10-CM | POA: Diagnosis not present

## 2022-02-24 DIAGNOSIS — F439 Reaction to severe stress, unspecified: Secondary | ICD-10-CM | POA: Diagnosis not present

## 2022-02-24 DIAGNOSIS — Z0000 Encounter for general adult medical examination without abnormal findings: Secondary | ICD-10-CM

## 2022-02-24 DIAGNOSIS — E7800 Pure hypercholesterolemia, unspecified: Secondary | ICD-10-CM | POA: Diagnosis not present

## 2022-02-24 NOTE — Progress Notes (Unsigned)
Patient ID: Claire Clayton, female   DOB: 1932/04/29, 88 y.o.   MRN: [id number]   Subjective:    Patient ID: Claire Clayton, female    DOB: 1931-10-23, 88 y.o.   MRN: [id number]   Patient here for  Chief Complaint  Patient presents with   Annual Exam    Labs done 02/21/22. Denies any concerns or pain.    Marland Kitchen   HPI Reports she is doing well. Stays active.  No chest pain or sob reported.  No abdominal pain.  Bowels moving. Having problems with her dentures.  Eating certain foods affected.  No abdominal pain.  Benefiber helping.  Some increased gas.  Asked to monitor - if triggers.  Overall she feels things are stable.  Reviewed outside blood pressures - under good control.     Past Medical History:  Diagnosis Date   Cancer ([place])    squamous cell skin   Diverticulosis    Glaucoma    Hypertension    Osteoporosis    Seasonal allergies    Past Surgical History:  Procedure Laterality Date   BREAST EXCISIONAL BIOPSY Left 1970   benign   CATARACT EXTRACTION, BILATERAL     SKIN CANCER EXCISION     carcinoma (3 surgeries)   TONSILLECTOMY  1948   Family History  Problem Relation Age of Onset   Colon cancer Brother    Colon polyps Sister    Breast cancer Neg Hx    Social History   Socioeconomic History   Marital status: Widowed    Spouse name: Not on file   Number of children: 3   Years of education: Not on file   Highest education level: Not on file  Occupational History   Not on file  Tobacco Use   Smoking status: Never   Smokeless tobacco: Never  Substance and Sexual Activity   Alcohol use: Yes    Alcohol/week: 0.0 standard drinks of alcohol    Comment: rare - glass of wine   Drug use: No   Sexual activity: Never  Other Topics Concern   Not on file  Social History Narrative   Not on file   Social Determinants of Health   Financial Resource Strain: Low Risk  (10/12/2020)   Overall Financial Resource Strain (CARDIA)    Difficulty of Paying Living Expenses:  Not hard at all  Food Insecurity: No Food Insecurity (10/12/2020)   Hunger Vital Sign    Worried About Running Out of Food in the Last Year: Never true    Ran Out of Food in the Last Year: Never true  Transportation Needs: No Transportation Needs (10/12/2020)   PRAPARE - Hydrologist (Medical): No    Lack of Transportation (Non-Medical): No  Physical Activity: Not on file  Stress: No Stress Concern Present (10/12/2020)   [place]    Feeling of Stress : Not at all  Social Connections: Unknown (10/12/2020)   Social Connection and Isolation Panel [NHANES]    Frequency of Communication with Friends and Family: More than three times a week    Frequency of Social Gatherings with Friends and Family: More than three times a week    Attends Religious Services: More than 4 times per year    Active Member of [employer] or Organizations: Not on file    Attends Archivist Meetings: Not on file    Marital Status: Not on file  Review of Systems  Constitutional:  Negative for appetite change and unexpected weight change.  HENT:  Negative for congestion, sinus pressure and sore throat.   Eyes:  Negative for pain and visual disturbance.  Respiratory:  Negative for cough, chest tightness and shortness of breath.   Cardiovascular:  Negative for chest pain, palpitations and leg swelling.  Gastrointestinal:  Negative for abdominal pain, diarrhea, nausea and vomiting.  Genitourinary:  Negative for difficulty urinating and dysuria.  Musculoskeletal:  Negative for joint swelling and myalgias.  Skin:  Negative for color change and rash.  Neurological:  Negative for dizziness, light-headedness and headaches.  Hematological:  Negative for adenopathy. Does not bruise/bleed easily.  Psychiatric/Behavioral:  Negative for agitation and dysphoric mood.        Objective:     BP (!) 170/90 (BP Location: Left  Arm, Patient Position: Sitting, Cuff Size: Normal)   Pulse 85   Temp 98.1 F (36.7 C) (Oral)   Resp 14   Ht 5\' 3"  (1.6 m)   Wt 111 lb 6.4 oz (50.5 kg)   SpO2 96%   BMI 19.73 kg/m  Wt Readings from Last 3 Encounters:  02/24/22 111 lb 6.4 oz (50.5 kg)  01/27/22 110 lb 9.6 oz (50.2 kg)  10/24/21 113 lb 3.2 oz (51.3 kg)    Physical Exam Vitals reviewed.  Constitutional:      General: She is not in acute distress.    Appearance: Normal appearance. She is well-developed.  HENT:     Head: Normocephalic and atraumatic.     Right Ear: External ear normal.     Left Ear: External ear normal.  Eyes:     General: No scleral icterus.       Right eye: No discharge.        Left eye: No discharge.     Conjunctiva/sclera: Conjunctivae normal.  Neck:     Thyroid: No thyromegaly.  Cardiovascular:     Rate and Rhythm: Normal rate and regular rhythm.  Pulmonary:     Effort: No tachypnea, accessory muscle usage or respiratory distress.     Breath sounds: Normal breath sounds. No decreased breath sounds or wheezing.  Chest:  Breasts:    Right: No inverted nipple, mass, nipple discharge or tenderness (no axillary adenopathy).     Left: No inverted nipple, mass, nipple discharge or tenderness (no axilarry adenopathy).  Abdominal:     General: Bowel sounds are normal.     Palpations: Abdomen is soft.     Tenderness: There is no abdominal tenderness.  Musculoskeletal:        General: No swelling or tenderness.     Cervical back: Neck supple.  Lymphadenopathy:     Cervical: No cervical adenopathy.  Skin:    Findings: No erythema or rash.  Neurological:     Mental Status: She is alert and oriented to person, place, and time.  Psychiatric:        Mood and Affect: Mood normal.        Behavior: Behavior normal.      Outpatient Encounter Medications as of 02/24/2022  Medication Sig   amLODipine (NORVASC) 2.5 MG tablet Take 1 tablet (2.5 mg total) by mouth daily.   aspirin EC 81 MG tablet  Take 81 mg by mouth daily.   atenolol (TENORMIN) 25 MG tablet Take 1 tablet (25 mg total) by mouth daily.   busPIRone (BUSPAR) 5 MG tablet Take 1 tablet (5 mg total) by mouth daily as needed.  Calcium Carbonate-Vitamin D 600-400 MG-UNIT per tablet Take 1 tablet by mouth daily.    hydrocortisone (CORTEF) 10 MG tablet SWISH AND EXPECTORATE TWO TEASPOONFULS THREE TIMES A DAY.   latanoprost (XALATAN) 0.005 % ophthalmic solution SMARTSIG:In Eye(s)   lisinopril (ZESTRIL) 20 MG tablet Take 1 tablet (20 mg total) by mouth daily.   Multiple Vitamins-Minerals (ICAPS PO) Take by mouth.   mupirocin ointment (BACTROBAN) 2 % Apply to affected area twicd a day   Niacin (VITAMIN B-3 PO) Take by mouth.   timolol (BETIMOL) 0.5 % ophthalmic solution Place 1 drop into both eyes 2 (two) times daily.   triamcinolone cream (KENALOG) 0.1 % Apply topically.   valACYclovir (VALTREX) 1000 MG tablet Take 2 tablets twice a day for 1 day with each outbreak   No facility-administered encounter medications on file as of 02/24/2022.     Lab Results  Component Value Date   WBC 5.6 02/21/2022   HGB 14.2 02/21/2022   HCT 41.9 02/21/2022   PLT 211.0 02/21/2022   GLUCOSE 111 (H) 02/21/2022   CHOL 181 02/21/2022   TRIG 80.0 02/21/2022   HDL 62.00 02/21/2022   LDLCALC 103 (H) 02/21/2022   ALT 15 02/21/2022   AST 21 02/21/2022   NA 140 02/21/2022   K 4.3 02/21/2022   CL 103 02/21/2022   CREATININE 0.79 02/21/2022   BUN 14 02/21/2022   CO2 29 02/21/2022   TSH 5.05 02/21/2022   INR 0.9 06/05/2018   HGBA1C 5.9 02/21/2022    Mammogram 3D SCREEN BREAST BILATERAL  Result Date: 02/09/2021 CLINICAL DATA:  Screening. EXAM: DIGITAL SCREENING BILATERAL MAMMOGRAM WITH TOMOSYNTHESIS AND CAD TECHNIQUE: Bilateral screening digital craniocaudal and mediolateral oblique mammograms were obtained. Bilateral screening digital breast tomosynthesis was performed. The images were evaluated with computer-aided detection. COMPARISON:   Previous exam(s). ACR Breast Density Category c: The breast tissue is heterogeneously dense, which may obscure small masses. FINDINGS: There are no findings suspicious for malignancy. IMPRESSION: No mammographic evidence of malignancy. A result letter of this screening mammogram will be mailed directly to the patient. RECOMMENDATION: Screening mammogram in one year. (Code:SM-B-01Y) BI-RADS CATEGORY  1: Negative. Electronically Signed   By: Lajean Manes M.D.   On: 02/09/2021 15:39  DG Bone Density  Result Date: 02/08/2021 EXAM: DUAL X-RAY ABSORPTIOMETRY (DXA) FOR BONE MINERAL DENSITY IMPRESSION: Your patient Geralda Baumgardner completed a BMD test on 02/08/2021 using the [place] (software version: 14.10) manufactured by [employer]. The following summarizes the results of our evaluation. Technologist: PATIENT BIOGRAPHICAL: Name: Dawnita, Molner Patient ID: [id number] Birth Date: [birth year] Height: 63.0 in. Gender: Female Exam Date: 02/08/2021 Weight: 110.6 lbs. Indications: Advanced Age, Caucasian, Height Loss, History of Fracture (Adult), Osteoporotic, Parent Hip Fracture, Postmenopausal, Vitamin D Deficiency Fractures: Facial Bone, Left foot Treatments: calcium w/ vit D, Multi-Vitamin, [place], Vitamin D DENSITOMETRY RESULTS: Site      Region     Measured Date Measured Age WHO Classification Young Adult T-score BMD         %Change vs. Previous Significant Change (*) AP Spine L1-L3 02/08/2023 87.0 Osteoporosis -3.8 0.713 g/cm2 2.9% - AP Spine L1-L3 08/22/2018 84.5 Osteoporosis -4.0 0.693 g/cm2 -8.3% Yes AP Spine L1-L3 06/23/2015 81.4 Osteoporosis -3.5 0.756 g/cm2 - - DualFemur Neck Right 02/08/2021 87.0 Osteoporosis -2.7 0.663 g/cm2 -4.7% - DualFemur Neck Right 08/22/2018 84.5 Osteoporosis -2.5 0.696 g/cm2 -3.7% - DualFemur Neck Right 06/23/2015 81.4 Osteopenia -2.3 0.723 g/cm2 - - DualFemur Total Mean 02/08/2021 87.0 Osteoporosis -2.5 0.688  g/cm2 -4.2% Yes DualFemur Total Mean  08/22/2018 84.5 Osteopenia -2.3 0.718 g/cm2 -4.6% Yes DualFemur Total Mean 06/23/2015 81.4 Osteopenia -2.0 0.753 g/cm2 - - ASSESSMENT: The BMD measured at AP Spine L1-L3 is 0.713 g/cm2 with a T-score of -3.8. This patient is considered osteoporotic according to [facility]) criteria. The scan quality is good. Compared with prior study, there has been no significant change in the spine. Compared with prior study, there has been a significant decrease in the total hip. [facility]) criteria for post-menopausal, Caucasian Women: Normal:                   T-score at or above -1 SD Osteopenia/low bone mass: T-score between -1 and -2.5 SD Osteoporosis:             T-score at or below -2.5 SD RECOMMENDATIONS: 1. All patients should optimize calcium and vitamin D intake. 2. Consider FDA-approved medical therapies in postmenopausal women and men aged 51 years and older, based on the following: a. A hip or vertebral(clinical or morphometric) fracture b. T-score < -2.5 at the femoral neck or spine after appropriate evaluation to exclude secondary causes c. Low bone mass (T-score between -1.0 and -2.5 at the femoral neck or spine) and a 10-year probability of a hip fracture > 3% or a 10-year probability of a major osteoporosis-related fracture > 20% based on the [place]-adapted WHO algorithm 3. Clinician judgment and/or patient preferences may indicate treatment for people with 10-year fracture probabilities above or below these levels FOLLOW-UP: People with diagnosed cases of osteoporosis or at high risk for fracture should have regular bone mineral density tests. For patients eligible for Medicare, routine testing is allowed once every 2 years. The testing frequency can be increased to one year for patients who have rapidly progressing disease, those who are receiving or discontinuing medical therapy to restore bone mass, or have additional risk factors. I have reviewed this report, and agree  with the above findings. Mark A. Thornton Papas, M.D. [facility] . Electronically Signed   By: Lavonia Dana M.D.   On: 02/08/2021 15:00       Assessment & Plan:   Problem List Items Addressed This Visit     Change in bowel movement    benefiber has helped.  Follow       Essential hypertension, benign    Reviewed outside blood pressures as outlined.  Continue atenolol, amlodipine and lisinopril.  Follow pressures.        Relevant Orders   Basic metabolic panel   Health care maintenance    Physical today 02/24/22.  Colonoscopy 2010. Declines f/u with GI.  Mammogram 02/09/21 - Birads I.  Scheduled for mammogram 03/23/22.       Hypercholesteremia    Have discussed calculated cholesterol risk.  Desires no medication.  Follow lipid panel.       Relevant Orders   Hepatic function panel   Lipid panel   Hyperglycemia    Follow met b and a1c.       Relevant Orders   Hemoglobin A1c   Stress    Overall appears to be handling things well.  Follow.       Other Visit Diagnoses     Routine general medical examination at a health care facility    -  Primary   Need for immunization against influenza       Relevant Orders   Flu Vaccine QUAD High Dose(Fluad) (Completed)  Isabell Bonafede, MD  

## 2022-02-24 NOTE — Patient Instructions (Signed)
Mammogram  - Thursday October 5 - 11:00

## 2022-02-24 NOTE — Assessment & Plan Note (Addendum)
Physical today 02/24/22.  Colonoscopy 2010. Declines f/u with GI.  Mammogram 02/09/21 - Birads I.  Scheduled for mammogram 03/23/22.

## 2022-02-26 ENCOUNTER — Encounter: Payer: Self-pay | Admitting: Internal Medicine

## 2022-02-26 NOTE — Assessment & Plan Note (Signed)
Have discussed calculated cholesterol risk.  Desires no medication.  Follow lipid panel.  

## 2022-02-26 NOTE — Assessment & Plan Note (Signed)
Follow met b and a1c.  

## 2022-02-26 NOTE — Assessment & Plan Note (Signed)
Overall appears to be handling things well.  Follow.  ?

## 2022-02-26 NOTE — Assessment & Plan Note (Signed)
benefiber has helped.  Follow

## 2022-02-26 NOTE — Assessment & Plan Note (Signed)
Reviewed outside blood pressures as outlined.  Continue atenolol, amlodipine and lisinopril.  Follow pressures.

## 2022-03-23 ENCOUNTER — Ambulatory Visit
Admission: RE | Admit: 2022-03-23 | Discharge: 2022-03-23 | Disposition: A | Discharge status: Home / Self Care | Payer: PPO | Source: Ambulatory Visit | Attending: Internal Medicine | Admitting: Internal Medicine

## 2022-03-23 DIAGNOSIS — Z1231 Encounter for screening mammogram for malignant neoplasm of breast: Secondary | ICD-10-CM | POA: Diagnosis not present

## 2022-04-13 DIAGNOSIS — H40153 Residual stage of open-angle glaucoma, bilateral: Secondary | ICD-10-CM | POA: Diagnosis not present

## 2022-06-05 ENCOUNTER — Other Ambulatory Visit: Payer: Self-pay | Admitting: Internal Medicine

## 2022-06-15 ENCOUNTER — Other Ambulatory Visit: Payer: Self-pay | Admitting: Internal Medicine

## 2022-06-26 ENCOUNTER — Other Ambulatory Visit (INDEPENDENT_AMBULATORY_CARE_PROVIDER_SITE_OTHER): Payer: PPO

## 2022-06-26 DIAGNOSIS — R739 Hyperglycemia, unspecified: Secondary | ICD-10-CM

## 2022-06-26 DIAGNOSIS — E7800 Pure hypercholesterolemia, unspecified: Secondary | ICD-10-CM | POA: Diagnosis not present

## 2022-06-26 DIAGNOSIS — I10 Essential (primary) hypertension: Secondary | ICD-10-CM

## 2022-06-26 LAB — LIPID PANEL
Cholesterol: 178 mg/dL (ref 0–200)
HDL: 59.5 mg/dL (ref >39.00)
LDL Cholesterol: 104 mg/dL — ABNORMAL HIGH (ref 0–99)
NonHDL: 118.05
Total CHOL/HDL Ratio: 3
Triglycerides: 69 mg/dL (ref 0.0–149.0)
VLDL: 13.8 mg/dL (ref 0.0–40.0)

## 2022-06-26 LAB — HEPATIC FUNCTION PANEL
ALT: 16 U/L (ref 0–35)
AST: 20 U/L (ref 0–37)
Albumin: 4.1 g/dL (ref 3.5–5.2)
Alkaline Phosphatase: 84 U/L (ref 39–117)
Bilirubin, Direct: 0.1 mg/dL (ref 0.0–0.3)
Total Bilirubin: 0.5 mg/dL (ref 0.2–1.2)
Total Protein: 6.4 g/dL (ref 6.0–8.3)

## 2022-06-26 LAB — HEMOGLOBIN A1C: Hgb A1c MFr Bld: 5.8 % (ref 4.6–6.5)

## 2022-06-26 LAB — BASIC METABOLIC PANEL
BUN: 18 mg/dL (ref 6–23)
CO2: 31 mEq/L (ref 19–32)
Calcium: 9.2 mg/dL (ref 8.4–10.5)
Chloride: 102 mEq/L (ref 96–112)
Creatinine, Ser: 0.78 mg/dL (ref 0.40–1.20)
GFR: 67.81 mL/min (ref >60.00)
Glucose, Bld: 117 mg/dL — ABNORMAL HIGH (ref 70–99)
Potassium: 4.7 mEq/L (ref 3.5–5.1)
Sodium: 139 mEq/L (ref 135–145)

## 2022-06-29 ENCOUNTER — Ambulatory Visit (INDEPENDENT_AMBULATORY_CARE_PROVIDER_SITE_OTHER): Payer: PPO | Admitting: Internal Medicine

## 2022-06-29 ENCOUNTER — Encounter: Payer: Self-pay | Admitting: Internal Medicine

## 2022-06-29 VITALS — BP 158/90 | HR 68 | Temp 98.0°F | Resp 15 | Ht 63.0 in | Wt 113.4 lb

## 2022-06-29 DIAGNOSIS — E559 Vitamin D deficiency, unspecified: Secondary | ICD-10-CM

## 2022-06-29 DIAGNOSIS — I10 Essential (primary) hypertension: Secondary | ICD-10-CM

## 2022-06-29 DIAGNOSIS — Z800 Family history of malignant neoplasm of digestive organs: Secondary | ICD-10-CM

## 2022-06-29 DIAGNOSIS — E7800 Pure hypercholesterolemia, unspecified: Secondary | ICD-10-CM

## 2022-06-29 DIAGNOSIS — R739 Hyperglycemia, unspecified: Secondary | ICD-10-CM

## 2022-06-29 DIAGNOSIS — F439 Reaction to severe stress, unspecified: Secondary | ICD-10-CM | POA: Diagnosis not present

## 2022-06-29 MED ORDER — LISINOPRIL 20 MG PO TABS: 20.0000 mg | ORAL_TABLET | Freq: Every day | ORAL | 2 refills | Status: DC

## 2022-06-29 MED ORDER — AMLODIPINE BESYLATE 2.5 MG PO TABS: 2.5000 mg | ORAL_TABLET | Freq: Every day | ORAL | 2 refills | Status: DC

## 2022-06-29 MED ORDER — ATENOLOL 25 MG PO TABS: 25.0000 mg | ORAL_TABLET | Freq: Every day | ORAL | 3 refills | Status: DC

## 2022-06-29 NOTE — Progress Notes (Signed)
Subjective:    Patient ID: Claire Clayton, female    DOB: [birth year], 89 y.o.   MRN: [id number]  Patient here for  Chief Complaint  Patient presents with   Medical Management of Chronic Issues   Hypertension   Hyperlipidemia    HPI Here to follow up regarding hypertension.  Blood pressure always elevated in office.  Reviewed outside blood pressure readings. Most averaging 120-130s/80s.  Stays active.  No chest pain or sob reported.  No increased cough or congestion. Benefiber helped bowels.  Still with some increased gas.  Eating well.  Taking [place] 3x/week.  Discussed.  Wants to monitor.  Increased stress.  Discussed.  Does not feel needs any further intervention.    Past Medical History:  Diagnosis Date   Cancer ([place])    squamous cell skin   Diverticulosis    Glaucoma    Hypertension    Osteoporosis    Seasonal allergies    Past Surgical History:  Procedure Laterality Date   BREAST EXCISIONAL BIOPSY Left 1970   benign   CATARACT EXTRACTION, BILATERAL     SKIN CANCER EXCISION     carcinoma (3 surgeries)   TONSILLECTOMY  1948   Family History  Problem Relation Age of Onset   Colon cancer Brother    Colon polyps Sister    Breast cancer Neg Hx    Social History   Socioeconomic History   Marital status: Widowed    Spouse name: Not on file   Number of children: 3   Years of education: Not on file   Highest education level: Not on file  Occupational History   Not on file  Tobacco Use   Smoking status: Never   Smokeless tobacco: Never  Substance and Sexual Activity   Alcohol use: Yes    Alcohol/week: 0.0 standard drinks of alcohol    Comment: rare - glass of wine   Drug use: No   Sexual activity: Never  Other Topics Concern   Not on file  Social History Narrative   Not on file   Social Determinants of Health   Financial Resource Strain: Low Risk  (10/12/2020)   Overall Financial Resource Strain (CARDIA)    Difficulty of Paying Living  Expenses: Not hard at all  Food Insecurity: No Food Insecurity (10/12/2020)   Hunger Vital Sign    Worried About Running Out of Food in the Last Year: Never true    Ran Out of Food in the Last Year: Never true  Transportation Needs: No Transportation Needs (10/12/2020)   PRAPARE - Hydrologist (Medical): No    Lack of Transportation (Non-Medical): No  Physical Activity: Not on file  Stress: No Stress Concern Present (10/12/2020)   [place]    Feeling of Stress : Not at all  Social Connections: Unknown (10/12/2020)   Social Connection and Isolation Panel [NHANES]    Frequency of Communication with Friends and Family: More than three times a week    Frequency of Social Gatherings with Friends and Family: More than three times a week    Attends Religious Services: More than 4 times per year    Active Member of [employer] or Organizations: Not on file    Attends Archivist Meetings: Not on file    Marital Status: Not on file     Review of Systems  Constitutional:  Negative for appetite change and unexpected weight  change.  HENT:  Negative for congestion and sinus pressure.   Respiratory:  Negative for cough, chest tightness and shortness of breath.   Cardiovascular:  Negative for chest pain, palpitations and leg swelling.  Gastrointestinal:  Negative for abdominal pain, diarrhea, nausea and vomiting.  Genitourinary:  Negative for difficulty urinating and dysuria.  Musculoskeletal:  Negative for joint swelling and myalgias.  Skin:  Negative for color change and rash.  Neurological:  Negative for dizziness and headaches.  Psychiatric/Behavioral:  Negative for agitation and dysphoric mood.        Objective:     BP (!) 158/90   Pulse 68   Temp 98 F (36.7 C) (Temporal)   Resp 15   Ht '5\' 3"'$  (1.6 m)   Wt 113 lb 6.4 oz (51.4 kg)   SpO2 98%   BMI 20.09 kg/m  Wt Readings from Last 3  Encounters:  06/29/22 113 lb 6.4 oz (51.4 kg)  02/24/22 111 lb 6.4 oz (50.5 kg)  01/27/22 110 lb 9.6 oz (50.2 kg)    Physical Exam Vitals reviewed.  Constitutional:      General: She is not in acute distress.    Appearance: Normal appearance.  HENT:     Head: Normocephalic and atraumatic.     Right Ear: External ear normal.     Left Ear: External ear normal.  Eyes:     General: No scleral icterus.       Right eye: No discharge.        Left eye: No discharge.     Conjunctiva/sclera: Conjunctivae normal.  Neck:     Thyroid: No thyromegaly.  Cardiovascular:     Rate and Rhythm: Normal rate and regular rhythm.  Pulmonary:     Effort: No respiratory distress.     Breath sounds: Normal breath sounds. No wheezing.  Abdominal:     General: Bowel sounds are normal.     Palpations: Abdomen is soft.     Tenderness: There is no abdominal tenderness.  Musculoskeletal:        General: No swelling or tenderness.     Cervical back: Neck supple. No tenderness.  Lymphadenopathy:     Cervical: No cervical adenopathy.  Skin:    Findings: No erythema or rash.  Neurological:     Mental Status: She is alert.  Psychiatric:        Mood and Affect: Mood normal.        Behavior: Behavior normal.      Outpatient Encounter Medications as of 06/29/2022  Medication Sig   aspirin EC 81 MG tablet Take 81 mg by mouth daily.   busPIRone (BUSPAR) 5 MG tablet Take 1 tablet (5 mg total) by mouth daily as needed.   Calcium Carbonate-Vitamin D 600-400 MG-UNIT per tablet Take 1 tablet by mouth daily.    hydrocortisone (CORTEF) 10 MG tablet SWISH AND EXPECTORATE TWO TEASPOONFULS THREE TIMES A DAY.   latanoprost (XALATAN) 0.005 % ophthalmic solution SMARTSIG:In Eye(s)   Multiple Vitamins-Minerals (ICAPS PO) Take by mouth.   mupirocin ointment (BACTROBAN) 2 % Apply to affected area twicd a day   Niacin (VITAMIN B-3 PO) Take by mouth.   timolol (BETIMOL) 0.5 % ophthalmic solution Place 1 drop into both  eyes 2 (two) times daily.   triamcinolone cream (KENALOG) 0.1 % Apply topically.   valACYclovir (VALTREX) 1000 MG tablet Take 2 tablets twice a day for 1 day with each outbreak   [DISCONTINUED] amLODipine (NORVASC) 2.5 MG tablet Take 1 tablet (  2.5 mg total) by mouth daily.   [DISCONTINUED] atenolol (TENORMIN) 25 MG tablet Take 1 tablet (25 mg total) by mouth daily.   [DISCONTINUED] lisinopril (ZESTRIL) 20 MG tablet Take 1 tablet (20 mg total) by mouth daily.   amLODipine (NORVASC) 2.5 MG tablet Take 1 tablet (2.5 mg total) by mouth daily.   atenolol (TENORMIN) 25 MG tablet Take 1 tablet (25 mg total) by mouth daily.   lisinopril (ZESTRIL) 20 MG tablet Take 1 tablet (20 mg total) by mouth daily.   No facility-administered encounter medications on file as of 06/29/2022.     Lab Results  Component Value Date   WBC 5.6 02/21/2022   HGB 14.2 02/21/2022   HCT 41.9 02/21/2022   PLT 211.0 02/21/2022   GLUCOSE 117 (H) 06/26/2022   CHOL 178 06/26/2022   TRIG 69.0 06/26/2022   HDL 59.50 06/26/2022   LDLCALC 104 (H) 06/26/2022   ALT 16 06/26/2022   AST 20 06/26/2022   NA 139 06/26/2022   K 4.7 06/26/2022   CL 102 06/26/2022   CREATININE 0.78 06/26/2022   BUN 18 06/26/2022   CO2 31 06/26/2022   TSH 5.05 02/21/2022   INR 0.9 06/05/2018   HGBA1C 5.8 06/26/2022    MM 3D SCREEN BREAST BILATERAL  Result Date: 03/24/2022 CLINICAL DATA:  Screening. EXAM: DIGITAL SCREENING BILATERAL MAMMOGRAM WITH TOMOSYNTHESIS AND CAD TECHNIQUE: Bilateral screening digital craniocaudal and mediolateral oblique mammograms were obtained. Bilateral screening digital breast tomosynthesis was performed. The images were evaluated with computer-aided detection. COMPARISON:  Previous exam(s). ACR Breast Density Category c: The breast tissue is heterogeneously dense, which may obscure small masses. FINDINGS: There are no findings suspicious for malignancy. IMPRESSION: No mammographic evidence of malignancy. A result letter  of this screening mammogram will be mailed directly to the patient. RECOMMENDATION: Screening mammogram in one year. (Code:SM-B-01Y) BI-RADS CATEGORY  1: Negative. Electronically Signed   By: Kristopher Oppenheim M.D.   On: 03/24/2022 09:58       Assessment & Plan:  Essential hypertension, benign Assessment & Plan: Reviewed outside blood pressures as outlined.  Continue atenolol, amlodipine and lisinopril.  Follow pressures.  Follow metabolic panel.  Desires no change since outside readings controlled.  Follow.   Orders: -     Basic metabolic panel; Future  Hyperglycemia Assessment & Plan: Follow met b and a1c.   Orders: -     Hemoglobin A1c; Future  Hypercholesteremia Assessment & Plan: Have discussed calculated cholesterol risk.  Desires no medication.  Follow lipid panel.   Orders: -     Lipid panel; Future -     Hepatic function panel; Future  Family history of colon cancer Assessment & Plan: Last colonoscopy 05/2009 - diverticulosis and internal hemorrhoids.  Previously discussed further GI w/up and evaluation given recent bleeding.  Wanted to hold on referral.    Stress Assessment & Plan: Overall appears to be handling things well.  Follow.    Vitamin D deficiency Assessment & Plan: Last vitamin D level wnl.    Other orders -     amLODIPine Besylate; Take 1 tablet (2.5 mg total) by mouth daily.  Dispense: 90 tablet; Refill: 2 -     Atenolol; Take 1 tablet (25 mg total) by mouth daily.  Dispense: 90 tablet; Refill: 3 -     Lisinopril; Take 1 tablet (20 mg total) by mouth daily.  Dispense: 90 tablet; Refill: 2     Einar Pheasant, MD

## 2022-07-02 ENCOUNTER — Encounter: Payer: Self-pay | Admitting: Internal Medicine

## 2022-07-02 NOTE — Assessment & Plan Note (Signed)
Follow met b and a1c.  

## 2022-07-02 NOTE — Assessment & Plan Note (Signed)
Reviewed outside blood pressures as outlined.  Continue atenolol, amlodipine and lisinopril.  Follow pressures.  Follow metabolic panel.  Desires no change since outside readings controlled.  Follow.

## 2022-07-02 NOTE — Assessment & Plan Note (Signed)
Last vitamin D level wnl.

## 2022-07-02 NOTE — Assessment & Plan Note (Signed)
Have discussed calculated cholesterol risk.  Desires no medication.  Follow lipid panel.

## 2022-07-02 NOTE — Assessment & Plan Note (Signed)
Overall appears to be handling things well.  Follow.  ?

## 2022-07-02 NOTE — Assessment & Plan Note (Signed)
Last colonoscopy 05/2009 - diverticulosis and internal hemorrhoids.  Previously discussed further GI w/up and evaluation given recent bleeding.  Wanted to hold on referral.

## 2022-08-15 DIAGNOSIS — L820 Inflamed seborrheic keratosis: Secondary | ICD-10-CM | POA: Diagnosis not present

## 2022-08-15 DIAGNOSIS — D492 Neoplasm of unspecified behavior of bone, soft tissue, and skin: Secondary | ICD-10-CM | POA: Diagnosis not present

## 2022-08-15 DIAGNOSIS — B009 Herpesviral infection, unspecified: Secondary | ICD-10-CM | POA: Diagnosis not present

## 2022-08-15 DIAGNOSIS — L858 Other specified epidermal thickening: Secondary | ICD-10-CM | POA: Diagnosis not present

## 2022-08-15 DIAGNOSIS — D229 Melanocytic nevi, unspecified: Secondary | ICD-10-CM | POA: Diagnosis not present

## 2022-08-15 DIAGNOSIS — L570 Actinic keratosis: Secondary | ICD-10-CM | POA: Diagnosis not present

## 2022-08-15 DIAGNOSIS — Z85828 Personal history of other malignant neoplasm of skin: Secondary | ICD-10-CM | POA: Diagnosis not present

## 2022-08-15 DIAGNOSIS — L814 Other melanin hyperpigmentation: Secondary | ICD-10-CM | POA: Diagnosis not present

## 2022-10-09 ENCOUNTER — Telehealth: Payer: Self-pay | Admitting: Internal Medicine

## 2022-10-09 NOTE — Telephone Encounter (Signed)
Called patient to schedule Medicare Annual Wellness Visit (AWV). Left message for patient to call back and schedule Medicare Annual Wellness Visit (AWV).  Last date of AWV: 10/13/2021   Please schedule an AWVS appointment at any time with [facility] VISIT.  If any questions, please contact me at [phone].    Thank you,  [facility] Support [facility] Direct dial  [phone]

## 2022-10-25 ENCOUNTER — Other Ambulatory Visit (INDEPENDENT_AMBULATORY_CARE_PROVIDER_SITE_OTHER): Payer: PPO

## 2022-10-25 DIAGNOSIS — I10 Essential (primary) hypertension: Secondary | ICD-10-CM | POA: Diagnosis not present

## 2022-10-25 DIAGNOSIS — E7800 Pure hypercholesterolemia, unspecified: Secondary | ICD-10-CM

## 2022-10-25 DIAGNOSIS — R739 Hyperglycemia, unspecified: Secondary | ICD-10-CM

## 2022-10-25 LAB — BASIC METABOLIC PANEL
BUN: 16 mg/dL (ref 6–23)
CO2: 30 mEq/L (ref 19–32)
Calcium: 9.3 mg/dL (ref 8.4–10.5)
Chloride: 100 mEq/L (ref 96–112)
Creatinine, Ser: 0.8 mg/dL (ref 0.40–1.20)
GFR: 65.63 mL/min (ref >60.00)
Glucose, Bld: 102 mg/dL — ABNORMAL HIGH (ref 70–99)
Potassium: 3.9 mEq/L (ref 3.5–5.1)
Sodium: 138 mEq/L (ref 135–145)

## 2022-10-25 LAB — HEPATIC FUNCTION PANEL
ALT: 18 U/L (ref 0–35)
AST: 23 U/L (ref 0–37)
Albumin: 4.1 g/dL (ref 3.5–5.2)
Alkaline Phosphatase: 92 U/L (ref 39–117)
Bilirubin, Direct: 0.2 mg/dL (ref 0.0–0.3)
Total Bilirubin: 0.7 mg/dL (ref 0.2–1.2)
Total Protein: 6.9 g/dL (ref 6.0–8.3)

## 2022-10-25 LAB — HEMOGLOBIN A1C: Hgb A1c MFr Bld: 5.7 % (ref 4.6–6.5)

## 2022-10-25 LAB — LIPID PANEL
Cholesterol: 175 mg/dL (ref 0–200)
HDL: 63.7 mg/dL (ref >39.00)
LDL Cholesterol: 97 mg/dL (ref 0–99)
NonHDL: 111.76
Total CHOL/HDL Ratio: 3
Triglycerides: 72 mg/dL (ref 0.0–149.0)
VLDL: 14.4 mg/dL (ref 0.0–40.0)

## 2022-10-30 ENCOUNTER — Ambulatory Visit (INDEPENDENT_AMBULATORY_CARE_PROVIDER_SITE_OTHER): Payer: PPO | Admitting: Internal Medicine

## 2022-10-30 ENCOUNTER — Encounter: Payer: Self-pay | Admitting: Internal Medicine

## 2022-10-30 VITALS — BP 111/69 | HR 82 | Temp 98.3°F | Resp 16 | Ht 63.0 in | Wt 111.4 lb

## 2022-10-30 DIAGNOSIS — F439 Reaction to severe stress, unspecified: Secondary | ICD-10-CM | POA: Diagnosis not present

## 2022-10-30 DIAGNOSIS — R739 Hyperglycemia, unspecified: Secondary | ICD-10-CM

## 2022-10-30 DIAGNOSIS — M810 Age-related osteoporosis without current pathological fracture: Secondary | ICD-10-CM

## 2022-10-30 DIAGNOSIS — E7800 Pure hypercholesterolemia, unspecified: Secondary | ICD-10-CM

## 2022-10-30 DIAGNOSIS — I10 Essential (primary) hypertension: Secondary | ICD-10-CM | POA: Diagnosis not present

## 2022-10-30 NOTE — Progress Notes (Signed)
Subjective:    Patient ID: Claire Clayton, female    DOB: Oct 24, 1931, 89 y.o.   MRN: [id number]  Patient here for  Chief Complaint  Patient presents with   Medical Management of Chronic Issues    HPI Here to follow up regarding hypercholesterolemia and hypertension.  Increased stress. Family stress.  She feels she is handling things relatively well.  Stays active.  No chest pain or sob reported.  No cough or increased congestion reported.  No abdominal pain or bowel change reported.  Having problems with her teeth and gums.  This affects her eating.  Weight down two pounds.  She eats a lot of vegetables.  No vomiting.  Overall she feels she is doing relatively well.  Discussed labs.     Past Medical History:  Diagnosis Date   Cancer (HCC)    squamous cell skin   Diverticulosis    Glaucoma    Hypertension    Osteoporosis    Seasonal allergies    Past Surgical History:  Procedure Laterality Date   BREAST EXCISIONAL BIOPSY Left 1970   benign   CATARACT EXTRACTION, BILATERAL     SKIN CANCER EXCISION     carcinoma (3 surgeries)   TONSILLECTOMY  1948   Family History  Problem Relation Age of Onset   Colon cancer Brother    Colon polyps Sister    Breast cancer Neg Hx    Social History   Socioeconomic History   Marital status: Widowed    Spouse name: Not on file   Number of children: 3   Years of education: Not on file   Highest education level: Not on file  Occupational History   Not on file  Tobacco Use   Smoking status: Never   Smokeless tobacco: Never  Substance and Sexual Activity   Alcohol use: Yes    Alcohol/week: 0.0 standard drinks of alcohol    Comment: rare - glass of wine   Drug use: No   Sexual activity: Never  Other Topics Concern   Not on file  Social History Narrative   Not on file   Social Determinants of Health   Financial Resource Strain: Low Risk  (10/12/2020)   Overall Financial Resource Strain (CARDIA)    Difficulty of Paying  Living Expenses: Not hard at all  Food Insecurity: No Food Insecurity (10/12/2020)   Hunger Vital Sign    Worried About Running Out of Food in the Last Year: Never true    Ran Out of Food in the Last Year: Never true  Transportation Needs: No Transportation Needs (10/12/2020)   PRAPARE - Administrator, Civil Service (Medical): No    Lack of Transportation (Non-Medical): No  Physical Activity: Not on file  Stress: No Stress Concern Present (10/12/2020)   [employer] of Occupational Health - Occupational Stress Questionnaire    Feeling of Stress : Not at all  Social Connections: Unknown (10/12/2020)   Social Connection and Isolation Panel [NHANES]    Frequency of Communication with Friends and Family: More than three times a week    Frequency of Social Gatherings with Friends and Family: More than three times a week    Attends Religious Services: More than 4 times per year    Active Member of [employer] or Organizations: Not on file    Attends Banker Meetings: Not on file    Marital Status: Not on file     Review of Systems  Constitutional:  Negative for appetite change.       Weight down a couple of pounds as outlined.   HENT:  Negative for congestion and sinus pressure.   Respiratory:  Negative for cough, chest tightness and shortness of breath.   Cardiovascular:  Negative for chest pain, palpitations and leg swelling.  Gastrointestinal:  Negative for abdominal pain, diarrhea, nausea and vomiting.  Genitourinary:  Negative for difficulty urinating and dysuria.  Musculoskeletal:  Negative for joint swelling and myalgias.  Skin:  Negative for color change and rash.  Neurological:  Negative for dizziness and headaches.  Psychiatric/Behavioral:  Negative for agitation and dysphoric mood.        Objective:     BP 111/69   Pulse 82   Temp 98.3 F (36.8 C)   Resp 16   Ht 5\' 3"  (1.6 m)   Wt 111 lb 6.4 oz (50.5 kg)   SpO2 98%   BMI 19.73 kg/m  Wt  Readings from Last 3 Encounters:  10/30/22 111 lb 6.4 oz (50.5 kg)  06/29/22 113 lb 6.4 oz (51.4 kg)  02/24/22 111 lb 6.4 oz (50.5 kg)    Physical Exam Vitals reviewed.  Constitutional:      General: She is not in acute distress.    Appearance: Normal appearance.  HENT:     Head: Normocephalic and atraumatic.     Right Ear: External ear normal.     Left Ear: External ear normal.  Eyes:     General: No scleral icterus.       Right eye: No discharge.        Left eye: No discharge.     Conjunctiva/sclera: Conjunctivae normal.  Neck:     Thyroid: No thyromegaly.  Cardiovascular:     Rate and Rhythm: Normal rate and regular rhythm.  Pulmonary:     Effort: No respiratory distress.     Breath sounds: Normal breath sounds. No wheezing.  Abdominal:     General: Bowel sounds are normal.     Palpations: Abdomen is soft.     Tenderness: There is no abdominal tenderness.  Musculoskeletal:        General: No swelling or tenderness.     Cervical back: Neck supple. No tenderness.  Lymphadenopathy:     Cervical: No cervical adenopathy.  Skin:    Findings: No erythema or rash.  Neurological:     Mental Status: She is alert.  Psychiatric:        Mood and Affect: Mood normal.        Behavior: Behavior normal.      Outpatient Encounter Medications as of 10/30/2022  Medication Sig   amLODipine (NORVASC) 2.5 MG tablet Take 1 tablet (2.5 mg total) by mouth daily.   aspirin EC 81 MG tablet Take 81 mg by mouth daily.   atenolol (TENORMIN) 25 MG tablet Take 1 tablet (25 mg total) by mouth daily.   busPIRone (BUSPAR) 5 MG tablet Take 1 tablet (5 mg total) by mouth daily as needed.   Calcium Carbonate-Vitamin D 600-400 MG-UNIT per tablet Take 1 tablet by mouth daily.    hydrocortisone (CORTEF) 10 MG tablet SWISH AND EXPECTORATE TWO TEASPOONFULS THREE TIMES A DAY.   latanoprost (XALATAN) 0.005 % ophthalmic solution SMARTSIG:In Eye(s)   lisinopril (ZESTRIL) 20 MG tablet Take 1 tablet (20 mg  total) by mouth daily.   Multiple Vitamins-Minerals (ICAPS PO) Take by mouth.   mupirocin ointment (BACTROBAN) 2 % Apply to affected area twicd a day  Niacin (VITAMIN B-3 PO) Take by mouth.   timolol (BETIMOL) 0.5 % ophthalmic solution Place 1 drop into both eyes 2 (two) times daily.   valACYclovir (VALTREX) 1000 MG tablet Take 2 tablets twice a day for 1 day with each outbreak   No facility-administered encounter medications on file as of 10/30/2022.     Lab Results  Component Value Date   WBC 5.6 02/21/2022   HGB 14.2 02/21/2022   HCT 41.9 02/21/2022   PLT 211.0 02/21/2022   GLUCOSE 102 (H) 10/25/2022   CHOL 175 10/25/2022   TRIG 72.0 10/25/2022   HDL 63.70 10/25/2022   LDLCALC 97 10/25/2022   ALT 18 10/25/2022   AST 23 10/25/2022   NA 138 10/25/2022   K 3.9 10/25/2022   CL 100 10/25/2022   CREATININE 0.80 10/25/2022   BUN 16 10/25/2022   CO2 30 10/25/2022   TSH 5.05 02/21/2022   INR 0.9 06/05/2018   HGBA1C 5.7 10/25/2022    MM 3D SCREEN BREAST BILATERAL  Result Date: 03/24/2022 CLINICAL DATA:  Screening. EXAM: DIGITAL SCREENING BILATERAL MAMMOGRAM WITH TOMOSYNTHESIS AND CAD TECHNIQUE: Bilateral screening digital craniocaudal and mediolateral oblique mammograms were obtained. Bilateral screening digital breast tomosynthesis was performed. The images were evaluated with computer-aided detection. COMPARISON:  Previous exam(s). ACR Breast Density Category c: The breast tissue is heterogeneously dense, which may obscure small masses. FINDINGS: There are no findings suspicious for malignancy. IMPRESSION: No mammographic evidence of malignancy. A result letter of this screening mammogram will be mailed directly to the patient. RECOMMENDATION: Screening mammogram in one year. (Code:SM-B-01Y) BI-RADS CATEGORY  1: Negative. Electronically Signed   By: Sande Brothers M.D.   On: 03/24/2022 09:58       Assessment & Plan:  Hyperglycemia Assessment & Plan: Follow met b and a1c.    Orders: -     Hemoglobin A1c; Future  Hypercholesteremia Assessment & Plan: Have discussed calculated cholesterol risk.  Desires no medication.  Follow lipid panel.   Orders: -     Lipid panel; Future -     Hepatic function panel; Future  Essential hypertension, benign Assessment & Plan: Reviewed outside blood pressures as outlined.  Continue atenolol, amlodipine and lisinopril.  Follow pressures.  Follow metabolic panel.  Desires no change since outside readings controlled.  Follow.   Orders: -     Basic metabolic panel; Future -     CBC with Differential/Platelet; Future -     TSH; Future  Osteoporosis without current pathological fracture, unspecified osteoporosis type Assessment & Plan: Discussed bone density/osteoporosis.  Discussed treatment.  Desires no prescription medication.  Continue calcium, vitamin D and weight bearing exercise.  Follow.     Stress Assessment & Plan: Overall appears to be handling things well.  Follow.       Dale Cotter, MD

## 2022-11-06 ENCOUNTER — Encounter: Payer: Self-pay | Admitting: Internal Medicine

## 2022-11-06 NOTE — Assessment & Plan Note (Signed)
Overall appears to be handling things well.  Follow.  ?

## 2022-11-06 NOTE — Assessment & Plan Note (Signed)
Discussed bone density/osteoporosis.  Discussed treatment.  Desires no prescription medication.  Continue calcium, vitamin D and weight bearing exercise.  Follow.   

## 2022-11-06 NOTE — Assessment & Plan Note (Signed)
Follow met b and a1c.  

## 2022-11-06 NOTE — Assessment & Plan Note (Signed)
Reviewed outside blood pressures as outlined.  Continue atenolol, amlodipine and lisinopril.  Follow pressures.  Follow metabolic panel.  Desires no change since outside readings controlled.  Follow.  

## 2022-11-06 NOTE — Assessment & Plan Note (Signed)
Have discussed calculated cholesterol risk.  Desires no medication.  Follow lipid panel.  

## 2023-01-02 DIAGNOSIS — L905 Scar conditions and fibrosis of skin: Secondary | ICD-10-CM | POA: Diagnosis not present

## 2023-01-02 DIAGNOSIS — L570 Actinic keratosis: Secondary | ICD-10-CM | POA: Diagnosis not present

## 2023-01-02 DIAGNOSIS — D492 Neoplasm of unspecified behavior of bone, soft tissue, and skin: Secondary | ICD-10-CM | POA: Diagnosis not present

## 2023-01-02 DIAGNOSIS — Z85828 Personal history of other malignant neoplasm of skin: Secondary | ICD-10-CM | POA: Diagnosis not present

## 2023-01-02 DIAGNOSIS — L820 Inflamed seborrheic keratosis: Secondary | ICD-10-CM | POA: Diagnosis not present

## 2023-01-02 DIAGNOSIS — L858 Other specified epidermal thickening: Secondary | ICD-10-CM | POA: Diagnosis not present

## 2023-02-01 ENCOUNTER — Encounter (INDEPENDENT_AMBULATORY_CARE_PROVIDER_SITE_OTHER): Payer: Self-pay

## 2023-02-08 DIAGNOSIS — L570 Actinic keratosis: Secondary | ICD-10-CM | POA: Diagnosis not present

## 2023-02-08 DIAGNOSIS — C4401 Basal cell carcinoma of skin of lip: Secondary | ICD-10-CM | POA: Diagnosis not present

## 2023-02-08 DIAGNOSIS — D2261 Melanocytic nevi of right upper limb, including shoulder: Secondary | ICD-10-CM | POA: Diagnosis not present

## 2023-02-08 DIAGNOSIS — D2271 Melanocytic nevi of right lower limb, including hip: Secondary | ICD-10-CM | POA: Diagnosis not present

## 2023-02-08 DIAGNOSIS — L821 Other seborrheic keratosis: Secondary | ICD-10-CM | POA: Diagnosis not present

## 2023-02-08 DIAGNOSIS — C44629 Squamous cell carcinoma of skin of left upper limb, including shoulder: Secondary | ICD-10-CM | POA: Diagnosis not present

## 2023-02-08 DIAGNOSIS — Z85828 Personal history of other malignant neoplasm of skin: Secondary | ICD-10-CM | POA: Diagnosis not present

## 2023-02-08 DIAGNOSIS — C44722 Squamous cell carcinoma of skin of right lower limb, including hip: Secondary | ICD-10-CM | POA: Diagnosis not present

## 2023-02-08 DIAGNOSIS — D485 Neoplasm of uncertain behavior of skin: Secondary | ICD-10-CM | POA: Diagnosis not present

## 2023-02-08 DIAGNOSIS — D2272 Melanocytic nevi of left lower limb, including hip: Secondary | ICD-10-CM | POA: Diagnosis not present

## 2023-02-08 DIAGNOSIS — D2262 Melanocytic nevi of left upper limb, including shoulder: Secondary | ICD-10-CM | POA: Diagnosis not present

## 2023-02-08 DIAGNOSIS — Z08 Encounter for follow-up examination after completed treatment for malignant neoplasm: Secondary | ICD-10-CM | POA: Diagnosis not present

## 2023-02-08 DIAGNOSIS — D225 Melanocytic nevi of trunk: Secondary | ICD-10-CM | POA: Diagnosis not present

## 2023-02-08 DIAGNOSIS — D0461 Carcinoma in situ of skin of right upper limb, including shoulder: Secondary | ICD-10-CM | POA: Diagnosis not present

## 2023-02-09 ENCOUNTER — Telehealth: Payer: Self-pay | Admitting: Internal Medicine

## 2023-02-09 NOTE — Telephone Encounter (Signed)
Copied from CRM [id number]. Topic: Medicare AWV >> Feb 09, 2023  2:55 PM Payton Doughty wrote: Reason for CRM: LM 02/09/2023 to schedule AWV  Verlee Rossetti; Care Guide Ambulatory Clinical Support [place] [facility] Direct Dial: [phone]

## 2023-02-26 DIAGNOSIS — C44629 Squamous cell carcinoma of skin of left upper limb, including shoulder: Secondary | ICD-10-CM | POA: Diagnosis not present

## 2023-03-08 ENCOUNTER — Other Ambulatory Visit: Payer: PPO

## 2023-03-12 ENCOUNTER — Encounter: Payer: PPO | Admitting: Internal Medicine

## 2023-03-12 DIAGNOSIS — D0461 Carcinoma in situ of skin of right upper limb, including shoulder: Secondary | ICD-10-CM | POA: Diagnosis not present

## 2023-03-12 DIAGNOSIS — D485 Neoplasm of uncertain behavior of skin: Secondary | ICD-10-CM | POA: Diagnosis not present

## 2023-03-12 DIAGNOSIS — C44722 Squamous cell carcinoma of skin of right lower limb, including hip: Secondary | ICD-10-CM | POA: Diagnosis not present

## 2023-03-12 DIAGNOSIS — R208 Other disturbances of skin sensation: Secondary | ICD-10-CM | POA: Diagnosis not present

## 2023-03-20 ENCOUNTER — Telehealth: Payer: Self-pay | Admitting: Internal Medicine

## 2023-03-20 NOTE — Telephone Encounter (Signed)
Copied from CRM [id number]. Topic: Medicare AWV >> Mar 20, 2023 10:30 AM Payton Doughty wrote: Reason for CRM: Called LM 03/20/2023 to schedule AWV   Verlee Rossetti; Care Guide Ambulatory Clinical Support [place] [facility] Direct Dial: [phone]

## 2023-04-05 ENCOUNTER — Other Ambulatory Visit: Payer: PPO

## 2023-04-05 DIAGNOSIS — R739 Hyperglycemia, unspecified: Secondary | ICD-10-CM | POA: Diagnosis not present

## 2023-04-05 DIAGNOSIS — E7800 Pure hypercholesterolemia, unspecified: Secondary | ICD-10-CM

## 2023-04-05 DIAGNOSIS — I10 Essential (primary) hypertension: Secondary | ICD-10-CM | POA: Diagnosis not present

## 2023-04-05 LAB — CBC WITH DIFFERENTIAL/PLATELET
Basophils Absolute: 0 10*3/uL (ref 0.0–0.1)
Basophils Relative: 0.5 % (ref 0.0–3.0)
Eosinophils Absolute: 0.1 10*3/uL (ref 0.0–0.7)
Eosinophils Relative: 2.2 % (ref 0.0–5.0)
HCT: 44.3 % (ref 36.0–46.0)
Hemoglobin: 14.9 g/dL (ref 12.0–15.0)
Lymphocytes Relative: 30.4 % (ref 12.0–46.0)
Lymphs Abs: 1.8 10*3/uL (ref 0.7–4.0)
MCHC: 33.6 g/dL (ref 30.0–36.0)
MCV: 93.8 fL (ref 78.0–100.0)
Monocytes Absolute: 0.5 10*3/uL (ref 0.1–1.0)
Monocytes Relative: 8.7 % (ref 3.0–12.0)
Neutro Abs: 3.5 10*3/uL (ref 1.4–7.7)
Neutrophils Relative %: 58.2 % (ref 43.0–77.0)
Platelets: 243 10*3/uL (ref 150.0–400.0)
RBC: 4.72 Mil/uL (ref 3.87–5.11)
RDW: 13.4 % (ref 11.5–15.5)
WBC: 6.1 10*3/uL (ref 4.0–10.5)

## 2023-04-05 LAB — HEPATIC FUNCTION PANEL
ALT: 15 U/L (ref 0–35)
AST: 20 U/L (ref 0–37)
Albumin: 4.1 g/dL (ref 3.5–5.2)
Alkaline Phosphatase: 94 U/L (ref 39–117)
Bilirubin, Direct: 0.1 mg/dL (ref 0.0–0.3)
Total Bilirubin: 0.5 mg/dL (ref 0.2–1.2)
Total Protein: 6.9 g/dL (ref 6.0–8.3)

## 2023-04-05 LAB — BASIC METABOLIC PANEL
BUN: 19 mg/dL (ref 6–23)
CO2: 29 meq/L (ref 19–32)
Calcium: 9.4 mg/dL (ref 8.4–10.5)
Chloride: 101 meq/L (ref 96–112)
Creatinine, Ser: 0.83 mg/dL (ref 0.40–1.20)
GFR: 62.6 mL/min (ref >60.00)
Glucose, Bld: 121 mg/dL — ABNORMAL HIGH (ref 70–99)
Potassium: 4.4 meq/L (ref 3.5–5.1)
Sodium: 138 meq/L (ref 135–145)

## 2023-04-05 LAB — TSH: TSH: 5.98 u[IU]/mL — ABNORMAL HIGH (ref 0.35–5.50)

## 2023-04-05 LAB — LIPID PANEL
Cholesterol: 172 mg/dL (ref 0–200)
HDL: 59.6 mg/dL (ref >39.00)
LDL Cholesterol: 97 mg/dL (ref 0–99)
NonHDL: 112.79
Total CHOL/HDL Ratio: 3
Triglycerides: 81 mg/dL (ref 0.0–149.0)
VLDL: 16.2 mg/dL (ref 0.0–40.0)

## 2023-04-05 LAB — HEMOGLOBIN A1C: Hgb A1c MFr Bld: 5.7 % (ref 4.6–6.5)

## 2023-04-06 ENCOUNTER — Encounter: Payer: PPO | Admitting: Internal Medicine

## 2023-04-09 ENCOUNTER — Encounter: Payer: PPO | Admitting: Internal Medicine

## 2023-04-10 ENCOUNTER — Encounter: Payer: Self-pay | Admitting: Internal Medicine

## 2023-04-10 ENCOUNTER — Ambulatory Visit: Payer: PPO | Admitting: Internal Medicine

## 2023-04-10 VITALS — BP 116/73 | HR 90 | Temp 97.8°F | Resp 16 | Ht 63.0 in | Wt 109.4 lb

## 2023-04-10 DIAGNOSIS — E7800 Pure hypercholesterolemia, unspecified: Secondary | ICD-10-CM

## 2023-04-10 DIAGNOSIS — R739 Hyperglycemia, unspecified: Secondary | ICD-10-CM | POA: Diagnosis not present

## 2023-04-10 DIAGNOSIS — F439 Reaction to severe stress, unspecified: Secondary | ICD-10-CM

## 2023-04-10 DIAGNOSIS — Z0000 Encounter for general adult medical examination without abnormal findings: Secondary | ICD-10-CM | POA: Diagnosis not present

## 2023-04-10 DIAGNOSIS — I10 Essential (primary) hypertension: Secondary | ICD-10-CM

## 2023-04-10 DIAGNOSIS — Z23 Encounter for immunization: Secondary | ICD-10-CM | POA: Diagnosis not present

## 2023-04-10 DIAGNOSIS — Z1231 Encounter for screening mammogram for malignant neoplasm of breast: Secondary | ICD-10-CM

## 2023-04-10 NOTE — Progress Notes (Signed)
Subjective:    Patient ID: Claire Clayton, female    DOB: 03-25-1932, 89 y.o.   MRN: [id number]  Patient here for  Chief Complaint  Patient presents with   Annual Exam    HPI Here for a physical exam. Persistent increased stress. Discussed.  Overall she feels she is handling things relatively well.  Has good support.  Does not feel needs any further intervention.  Stays active. No chest pain or sob reported.  No cough or congestion.  No abdominal pain or bowel change reported.  Reviewed outside blood pressure readings - most averaging 119-130s/70-80s.  Elevated here in the office.     Past Medical History:  Diagnosis Date   Cancer (HCC)    squamous cell skin   Diverticulosis    Glaucoma    Hypertension    Osteoporosis    Seasonal allergies    Past Surgical History:  Procedure Laterality Date   BREAST EXCISIONAL BIOPSY Left 1970   benign   CATARACT EXTRACTION, BILATERAL     SKIN CANCER EXCISION     carcinoma (3 surgeries)   TONSILLECTOMY  1948   Family History  Problem Relation Age of Onset   Colon cancer Brother    Colon polyps Sister    Breast cancer Neg Hx    Social History   Socioeconomic History   Marital status: Widowed    Spouse name: Not on file   Number of children: 3   Years of education: Not on file   Highest education level: Not on file  Occupational History   Not on file  Tobacco Use   Smoking status: Never   Smokeless tobacco: Never  Substance and Sexual Activity   Alcohol use: Yes    Alcohol/week: 0.0 standard drinks of alcohol    Comment: rare - glass of wine   Drug use: No   Sexual activity: Never  Other Topics Concern   Not on file  Social History Narrative   Not on file   Social Determinants of Health   Financial Resource Strain: Low Risk  (10/12/2020)   Overall Financial Resource Strain (CARDIA)    Difficulty of Paying Living Expenses: Not hard at all  Food Insecurity: No Food Insecurity (10/12/2020)   Hunger Vital Sign     Worried About Running Out of Food in the Last Year: Never true    Ran Out of Food in the Last Year: Never true  Transportation Needs: No Transportation Needs (10/12/2020)   PRAPARE - Administrator, Civil Service (Medical): No    Lack of Transportation (Non-Medical): No  Physical Activity: Not on file  Stress: No Stress Concern Present (10/12/2020)   [employer] of Occupational Health - Occupational Stress Questionnaire    Feeling of Stress : Not at all  Social Connections: Unknown (10/12/2020)   Social Connection and Isolation Panel [NHANES]    Frequency of Communication with Friends and Family: More than three times a week    Frequency of Social Gatherings with Friends and Family: More than three times a week    Attends Religious Services: More than 4 times per year    Active Member of [employer] or Organizations: Not on file    Attends Banker Meetings: Not on file    Marital Status: Not on file     Review of Systems  Constitutional:  Negative for appetite change and unexpected weight change.  HENT:  Negative for congestion, sinus pressure and sore throat.  Eyes:  Negative for pain and visual disturbance.  Respiratory:  Negative for cough, chest tightness and shortness of breath.   Cardiovascular:  Negative for chest pain and palpitations.  Gastrointestinal:  Negative for abdominal pain, diarrhea, nausea and vomiting.  Genitourinary:  Negative for difficulty urinating and dysuria.  Musculoskeletal:  Negative for joint swelling and myalgias.  Skin:  Negative for color change and rash.  Neurological:  Negative for dizziness and headaches.  Hematological:  Negative for adenopathy. Does not bruise/bleed easily.  Psychiatric/Behavioral:  Negative for agitation and dysphoric mood.        Objective:     BP 116/73   Pulse 90   Temp 97.8 F (36.6 C)   Resp 16   Ht 5\' 3"  (1.6 m)   Wt 109 lb 6.4 oz (49.6 kg)   SpO2 98%   BMI 19.38 kg/m  Wt Readings  from Last 3 Encounters:  04/10/23 109 lb 6.4 oz (49.6 kg)  10/30/22 111 lb 6.4 oz (50.5 kg)  06/29/22 113 lb 6.4 oz (51.4 kg)    Physical Exam Vitals reviewed.  Constitutional:      General: She is not in acute distress.    Appearance: Normal appearance.  HENT:     Head: Normocephalic and atraumatic.     Right Ear: External ear normal.     Left Ear: External ear normal.  Eyes:     General: No scleral icterus.       Right eye: No discharge.        Left eye: No discharge.     Conjunctiva/sclera: Conjunctivae normal.  Neck:     Thyroid: No thyromegaly.  Cardiovascular:     Rate and Rhythm: Normal rate and regular rhythm.  Pulmonary:     Effort: No respiratory distress.     Breath sounds: Normal breath sounds. No wheezing.  Abdominal:     General: Bowel sounds are normal.     Palpations: Abdomen is soft.     Tenderness: There is no abdominal tenderness.  Musculoskeletal:        General: No swelling or tenderness.     Cervical back: Neck supple. No tenderness.  Lymphadenopathy:     Cervical: No cervical adenopathy.  Skin:    Findings: No erythema or rash.  Neurological:     Mental Status: She is alert.  Psychiatric:        Mood and Affect: Mood normal.        Behavior: Behavior normal.      Outpatient Encounter Medications as of 04/10/2023  Medication Sig   amLODipine (NORVASC) 2.5 MG tablet Take 1 tablet (2.5 mg total) by mouth daily.   aspirin EC 81 MG tablet Take 81 mg by mouth daily.   atenolol (TENORMIN) 25 MG tablet Take 1 tablet (25 mg total) by mouth daily.   busPIRone (BUSPAR) 5 MG tablet Take 1 tablet (5 mg total) by mouth daily as needed.   Calcium Carbonate-Vitamin D 600-400 MG-UNIT per tablet Take 1 tablet by mouth daily.    hydrocortisone (CORTEF) 10 MG tablet SWISH AND EXPECTORATE TWO TEASPOONFULS THREE TIMES A DAY.   latanoprost (XALATAN) 0.005 % ophthalmic solution SMARTSIG:In Eye(s)   lisinopril (ZESTRIL) 20 MG tablet Take 1 tablet (20 mg total) by  mouth daily.   Multiple Vitamins-Minerals (ICAPS PO) Take by mouth.   mupirocin ointment (BACTROBAN) 2 % Apply to affected area twicd a day   Niacin (VITAMIN B-3 PO) Take by mouth.   timolol (BETIMOL) 0.5 %  ophthalmic solution Place 1 drop into both eyes 2 (two) times daily.   valACYclovir (VALTREX) 1000 MG tablet Take 2 tablets twice a day for 1 day with each outbreak   No facility-administered encounter medications on file as of 04/10/2023.     Lab Results  Component Value Date   WBC 6.1 04/05/2023   HGB 14.9 04/05/2023   HCT 44.3 04/05/2023   PLT 243.0 04/05/2023   GLUCOSE 121 (H) 04/05/2023   CHOL 172 04/05/2023   TRIG 81.0 04/05/2023   HDL 59.60 04/05/2023   LDLCALC 97 04/05/2023   ALT 15 04/05/2023   AST 20 04/05/2023   NA 138 04/05/2023   K 4.4 04/05/2023   CL 101 04/05/2023   CREATININE 0.83 04/05/2023   BUN 19 04/05/2023   CO2 29 04/05/2023   TSH 5.98 (H) 04/05/2023   INR 0.9 06/05/2018   HGBA1C 5.7 04/05/2023    MM 3D SCREEN BREAST BILATERAL  Result Date: 03/24/2022 CLINICAL DATA:  Screening. EXAM: DIGITAL SCREENING BILATERAL MAMMOGRAM WITH TOMOSYNTHESIS AND CAD TECHNIQUE: Bilateral screening digital craniocaudal and mediolateral oblique mammograms were obtained. Bilateral screening digital breast tomosynthesis was performed. The images were evaluated with computer-aided detection. COMPARISON:  Previous exam(s). ACR Breast Density Category c: The breast tissue is heterogeneously dense, which may obscure small masses. FINDINGS: There are no findings suspicious for malignancy. IMPRESSION: No mammographic evidence of malignancy. A result letter of this screening mammogram will be mailed directly to the patient. RECOMMENDATION: Screening mammogram in one year. (Code:SM-B-01Y) BI-RADS CATEGORY  1: Negative. Electronically Signed   By: Sande Brothers M.D.   On: 03/24/2022 09:58       Assessment & Plan:  Health care maintenance Assessment & Plan: Physical today 04/10/23.   Colonoscopy 2010. Declines f/u with GI.  Mammogram 03/23/22 - Birads I.  Schedule for mammogram.     Visit for screening mammogram -     3D Screening Mammogram, Left and Right; Future  Need for influenza vaccination -     Flu Vaccine Trivalent High Dose (Fluad)  Stress Assessment & Plan: Increased stress discussed. Overall appears to be handling things well.  Does not fele needs any further intervention.  Follow.    Hyperglycemia Assessment & Plan: Follow met b and a1c.    Hypercholesteremia Assessment & Plan: Have discussed calculated cholesterol risk.  Desires no medication.  Follow lipid panel.    Essential hypertension, benign Assessment & Plan: Reviewed outside blood pressures as outlined.  Continue atenolol, amlodipine and lisinopril.  Follow pressures.  Follow metabolic panel.  Desires no change since outside readings controlled.  Discussed again today regarding in office elevation.  Desires no further medication. Follow.       Dale [place], MD

## 2023-04-10 NOTE — Assessment & Plan Note (Signed)
Physical today 04/10/23.  Colonoscopy 2010. Declines f/u with GI.  Mammogram 03/23/22 - Birads I.  Schedule for mammogram.

## 2023-04-15 ENCOUNTER — Encounter: Payer: Self-pay | Admitting: Internal Medicine

## 2023-04-15 NOTE — Assessment & Plan Note (Signed)
Have discussed calculated cholesterol risk.  Desires no medication.  Follow lipid panel.  

## 2023-04-15 NOTE — Assessment & Plan Note (Signed)
Increased stress discussed. Overall appears to be handling things well.  Does not fele needs any further intervention.  Follow.

## 2023-04-15 NOTE — Assessment & Plan Note (Signed)
Reviewed outside blood pressures as outlined.  Continue atenolol, amlodipine and lisinopril.  Follow pressures.  Follow metabolic panel.  Desires no change since outside readings controlled.  Discussed again today regarding in office elevation.  Desires no further medication. Follow.

## 2023-04-15 NOTE — Assessment & Plan Note (Signed)
Follow met b and a1c.  

## 2023-04-18 DIAGNOSIS — C4401 Basal cell carcinoma of skin of lip: Secondary | ICD-10-CM | POA: Diagnosis not present

## 2023-04-18 DIAGNOSIS — D492 Neoplasm of unspecified behavior of bone, soft tissue, and skin: Secondary | ICD-10-CM | POA: Diagnosis not present

## 2023-04-18 DIAGNOSIS — L578 Other skin changes due to chronic exposure to nonionizing radiation: Secondary | ICD-10-CM | POA: Diagnosis not present

## 2023-04-18 DIAGNOSIS — C44722 Squamous cell carcinoma of skin of right lower limb, including hip: Secondary | ICD-10-CM | POA: Diagnosis not present

## 2023-04-20 ENCOUNTER — Other Ambulatory Visit: Payer: Self-pay | Admitting: Internal Medicine

## 2023-04-21 ENCOUNTER — Other Ambulatory Visit: Payer: Self-pay | Admitting: Internal Medicine

## 2023-04-23 MED ORDER — BUSPIRONE HCL 5 MG PO TABS: 5.0000 mg | ORAL_TABLET | Freq: Every day | ORAL | 0 refills | Status: AC | PRN

## 2023-05-02 ENCOUNTER — Ambulatory Visit
Admission: RE | Admit: 2023-05-02 | Discharge: 2023-05-02 | Disposition: A | Discharge status: Home / Self Care | Payer: PPO | Source: Ambulatory Visit | Attending: Internal Medicine | Admitting: Internal Medicine

## 2023-05-02 DIAGNOSIS — Z1231 Encounter for screening mammogram for malignant neoplasm of breast: Secondary | ICD-10-CM | POA: Diagnosis not present

## 2023-05-22 DIAGNOSIS — H40153 Residual stage of open-angle glaucoma, bilateral: Secondary | ICD-10-CM | POA: Diagnosis not present

## 2023-06-04 ENCOUNTER — Other Ambulatory Visit: Payer: Self-pay | Admitting: Internal Medicine

## 2023-06-27 DIAGNOSIS — Z85828 Personal history of other malignant neoplasm of skin: Secondary | ICD-10-CM | POA: Diagnosis not present

## 2023-07-17 ENCOUNTER — Other Ambulatory Visit: Payer: Self-pay | Admitting: Internal Medicine

## 2023-07-19 DIAGNOSIS — D2261 Melanocytic nevi of right upper limb, including shoulder: Secondary | ICD-10-CM | POA: Diagnosis not present

## 2023-07-19 DIAGNOSIS — L858 Other specified epidermal thickening: Secondary | ICD-10-CM | POA: Diagnosis not present

## 2023-07-19 DIAGNOSIS — L570 Actinic keratosis: Secondary | ICD-10-CM | POA: Diagnosis not present

## 2023-07-19 DIAGNOSIS — C44729 Squamous cell carcinoma of skin of left lower limb, including hip: Secondary | ICD-10-CM | POA: Diagnosis not present

## 2023-07-19 DIAGNOSIS — D225 Melanocytic nevi of trunk: Secondary | ICD-10-CM | POA: Diagnosis not present

## 2023-07-19 DIAGNOSIS — D2271 Melanocytic nevi of right lower limb, including hip: Secondary | ICD-10-CM | POA: Diagnosis not present

## 2023-07-19 DIAGNOSIS — D2272 Melanocytic nevi of left lower limb, including hip: Secondary | ICD-10-CM | POA: Diagnosis not present

## 2023-07-19 DIAGNOSIS — C44722 Squamous cell carcinoma of skin of right lower limb, including hip: Secondary | ICD-10-CM | POA: Diagnosis not present

## 2023-07-19 DIAGNOSIS — L821 Other seborrheic keratosis: Secondary | ICD-10-CM | POA: Diagnosis not present

## 2023-07-19 DIAGNOSIS — D2262 Melanocytic nevi of left upper limb, including shoulder: Secondary | ICD-10-CM | POA: Diagnosis not present

## 2023-07-19 DIAGNOSIS — D485 Neoplasm of uncertain behavior of skin: Secondary | ICD-10-CM | POA: Diagnosis not present

## 2023-07-19 DIAGNOSIS — C4441 Basal cell carcinoma of skin of scalp and neck: Secondary | ICD-10-CM | POA: Diagnosis not present

## 2023-07-30 ENCOUNTER — Telehealth: Payer: Self-pay | Admitting: Internal Medicine

## 2023-07-30 DIAGNOSIS — I10 Essential (primary) hypertension: Secondary | ICD-10-CM

## 2023-07-30 DIAGNOSIS — E7800 Pure hypercholesterolemia, unspecified: Secondary | ICD-10-CM

## 2023-07-30 DIAGNOSIS — R739 Hyperglycemia, unspecified: Secondary | ICD-10-CM

## 2023-07-30 NOTE — Telephone Encounter (Signed)
 Patient need lab orders.

## 2023-07-31 NOTE — Addendum Note (Signed)
Addended by: Rita Ohara D on: 07/31/2023 09:04 AM   Modules accepted: Orders

## 2023-08-07 ENCOUNTER — Other Ambulatory Visit: Payer: PPO

## 2023-08-13 ENCOUNTER — Ambulatory Visit: Payer: PPO | Admitting: Internal Medicine

## 2023-08-27 DIAGNOSIS — C44729 Squamous cell carcinoma of skin of left lower limb, including hip: Secondary | ICD-10-CM | POA: Diagnosis not present

## 2023-09-04 ENCOUNTER — Other Ambulatory Visit: Payer: Self-pay | Admitting: Internal Medicine

## 2023-09-10 DIAGNOSIS — C4441 Basal cell carcinoma of skin of scalp and neck: Secondary | ICD-10-CM | POA: Diagnosis not present

## 2023-09-20 ENCOUNTER — Other Ambulatory Visit (INDEPENDENT_AMBULATORY_CARE_PROVIDER_SITE_OTHER): Payer: PPO

## 2023-09-20 DIAGNOSIS — E7800 Pure hypercholesterolemia, unspecified: Secondary | ICD-10-CM

## 2023-09-20 DIAGNOSIS — I10 Essential (primary) hypertension: Secondary | ICD-10-CM | POA: Diagnosis not present

## 2023-09-20 DIAGNOSIS — R739 Hyperglycemia, unspecified: Secondary | ICD-10-CM

## 2023-09-20 LAB — LIPID PANEL
Cholesterol: 174 mg/dL (ref 0–200)
HDL: 63 mg/dL (ref >39.00)
LDL Cholesterol: 95 mg/dL (ref 0–99)
NonHDL: 111.18
Total CHOL/HDL Ratio: 3
Triglycerides: 80 mg/dL (ref 0.0–149.0)
VLDL: 16 mg/dL (ref 0.0–40.0)

## 2023-09-20 LAB — HEPATIC FUNCTION PANEL
ALT: 16 U/L (ref 0–35)
AST: 19 U/L (ref 0–37)
Albumin: 4.2 g/dL (ref 3.5–5.2)
Alkaline Phosphatase: 87 U/L (ref 39–117)
Bilirubin, Direct: 0.1 mg/dL (ref 0.0–0.3)
Total Bilirubin: 0.5 mg/dL (ref 0.2–1.2)
Total Protein: 6.3 g/dL (ref 6.0–8.3)

## 2023-09-20 LAB — BASIC METABOLIC PANEL WITH GFR
BUN: 16 mg/dL (ref 6–23)
CO2: 31 meq/L (ref 19–32)
Calcium: 9.1 mg/dL (ref 8.4–10.5)
Chloride: 102 meq/L (ref 96–112)
Creatinine, Ser: 0.76 mg/dL (ref 0.40–1.20)
GFR: 69.36 mL/min (ref >60.00)
Glucose, Bld: 109 mg/dL — ABNORMAL HIGH (ref 70–99)
Potassium: 4.3 meq/L (ref 3.5–5.1)
Sodium: 139 meq/L (ref 135–145)

## 2023-09-20 LAB — HEMOGLOBIN A1C: Hgb A1c MFr Bld: 5.6 % (ref 4.6–6.5)

## 2023-09-24 DIAGNOSIS — C44729 Squamous cell carcinoma of skin of left lower limb, including hip: Secondary | ICD-10-CM | POA: Diagnosis not present

## 2023-09-24 DIAGNOSIS — C44722 Squamous cell carcinoma of skin of right lower limb, including hip: Secondary | ICD-10-CM | POA: Diagnosis not present

## 2023-09-25 ENCOUNTER — Ambulatory Visit (INDEPENDENT_AMBULATORY_CARE_PROVIDER_SITE_OTHER): Payer: PPO | Admitting: Internal Medicine

## 2023-09-25 ENCOUNTER — Encounter: Payer: Self-pay | Admitting: Internal Medicine

## 2023-09-25 VITALS — BP 118/79 | HR 88 | Temp 98.0°F | Resp 16 | Ht 63.0 in | Wt 111.6 lb

## 2023-09-25 DIAGNOSIS — F439 Reaction to severe stress, unspecified: Secondary | ICD-10-CM

## 2023-09-25 DIAGNOSIS — E7800 Pure hypercholesterolemia, unspecified: Secondary | ICD-10-CM | POA: Diagnosis not present

## 2023-09-25 DIAGNOSIS — R739 Hyperglycemia, unspecified: Secondary | ICD-10-CM

## 2023-09-25 DIAGNOSIS — I10 Essential (primary) hypertension: Secondary | ICD-10-CM

## 2023-09-25 DIAGNOSIS — J069 Acute upper respiratory infection, unspecified: Secondary | ICD-10-CM

## 2023-09-25 MED ORDER — AMLODIPINE BESYLATE 2.5 MG PO TABS: 2.5000 mg | ORAL_TABLET | Freq: Every day | ORAL | 1 refills | Status: DC

## 2023-09-25 MED ORDER — LISINOPRIL 20 MG PO TABS: 20.0000 mg | ORAL_TABLET | Freq: Every day | ORAL | 1 refills | Status: AC

## 2023-09-25 MED ORDER — ATENOLOL 25 MG PO TABS: 25.0000 mg | ORAL_TABLET | Freq: Every day | ORAL | 1 refills | Status: AC

## 2023-09-25 MED ORDER — AZITHROMYCIN 250 MG PO TABS: ORAL_TABLET | ORAL | 0 refills | Status: AC

## 2023-09-25 NOTE — Progress Notes (Signed)
 Subjective:    Patient ID: Claire Clayton, female    DOB: 09-24-1931, 89 y.o.   MRN: [id number]  Patient here for  Chief Complaint  Patient presents with   Medical Management of Chronic Issues    HPI Here for a scheduled follow up - follow up regarding hypertension and increased stress. Reports that starting a few days ago she noticed a sore throat. Also noticed increased head congestion. Mowed a couple of days before this. Increased pollen. Reports increased nasal stuffiness. Increased sinus pressure - right > left. Increased drainage. No sore throat now. No diarrhea. Has tried claritin, benadryl. Also used nasacort and saline nasal spray. Using humidifier. Has had increased cough. Discussed delsym. Increased stress. Two sisters passed recently. Increased stress related to her granddaughter as well.    Past Medical History:  Diagnosis Date   Cancer (HCC)    squamous cell skin   Diverticulosis    Glaucoma    Hypertension    Osteoporosis    Seasonal allergies    Past Surgical History:  Procedure Laterality Date   BREAST EXCISIONAL BIOPSY Left 1970   benign   CATARACT EXTRACTION, BILATERAL     SKIN CANCER EXCISION     carcinoma (3 surgeries)   TONSILLECTOMY  1948   Family History  Problem Relation Age of Onset   Colon cancer Brother    Colon polyps Sister    Breast cancer Neg Hx    Social History   Socioeconomic History   Marital status: Widowed    Spouse name: Not on file   Number of children: 3   Years of education: Not on file   Highest education level: Not on file  Occupational History   Not on file  Tobacco Use   Smoking status: Never   Smokeless tobacco: Never  Substance and Sexual Activity   Alcohol use: Yes    Alcohol/week: 0.0 standard drinks of alcohol    Comment: rare - glass of wine   Drug use: No   Sexual activity: Never  Other Topics Concern   Not on file  Social History Narrative   Not on file   Social Drivers of Health   Financial  Resource Strain: Low Risk  (10/12/2020)   Overall Financial Resource Strain (CARDIA)    Difficulty of Paying Living Expenses: Not hard at all  Food Insecurity: No Food Insecurity (10/12/2020)   Hunger Vital Sign    Worried About Running Out of Food in the Last Year: Never true    Ran Out of Food in the Last Year: Never true  Transportation Needs: No Transportation Needs (10/12/2020)   PRAPARE - Administrator, Civil Service (Medical): No    Lack of Transportation (Non-Medical): No  Physical Activity: Not on file  Stress: No Stress Concern Present (10/12/2020)   [employer] of Occupational Health - Occupational Stress Questionnaire    Feeling of Stress : Not at all  Social Connections: Unknown (10/12/2020)   Social Connection and Isolation Panel [NHANES]    Frequency of Communication with Friends and Family: More than three times a week    Frequency of Social Gatherings with Friends and Family: More than three times a week    Attends Religious Services: More than 4 times per year    Active Member of [employer] or Organizations: Not on file    Attends Banker Meetings: Not on file    Marital Status: Not on file     Review of  Systems  Constitutional:  Negative for appetite change and unexpected weight change.  HENT:  Positive for congestion, postnasal drip and sinus pressure.   Respiratory:  Positive for cough. Negative for chest tightness and shortness of breath.   Cardiovascular:  Negative for chest pain, palpitations and leg swelling.  Gastrointestinal:  Negative for abdominal pain, diarrhea, nausea and vomiting.  Genitourinary:  Negative for difficulty urinating and dysuria.  Musculoskeletal:  Negative for joint swelling and myalgias.  Skin:  Negative for color change and rash.  Neurological:  Negative for dizziness and headaches.  Psychiatric/Behavioral:  Negative for agitation and dysphoric mood.        Objective:     BP 118/79   Pulse 88   Temp 98  F (36.7 C)   Resp 16   Ht 5\' 3"  (1.6 m)   Wt 111 lb 9.6 oz (50.6 kg)   SpO2 98%   BMI 19.77 kg/m  Wt Readings from Last 3 Encounters:  09/25/23 111 lb 9.6 oz (50.6 kg)  04/10/23 109 lb 6.4 oz (49.6 kg)  10/30/22 111 lb 6.4 oz (50.5 kg)    Physical Exam Vitals reviewed.  Constitutional:      General: She is not in acute distress.    Appearance: Normal appearance.  HENT:     Head: Normocephalic and atraumatic.     Right Ear: External ear normal.     Left Ear: External ear normal.  Eyes:     General: No scleral icterus.       Right eye: No discharge.        Left eye: No discharge.     Conjunctiva/sclera: Conjunctivae normal.  Neck:     Thyroid: No thyromegaly.  Cardiovascular:     Rate and Rhythm: Normal rate and regular rhythm.  Pulmonary:     Effort: No respiratory distress.     Breath sounds: Normal breath sounds. No wheezing.  Abdominal:     General: Bowel sounds are normal.     Palpations: Abdomen is soft.     Tenderness: There is no abdominal tenderness.  Musculoskeletal:        General: No swelling or tenderness.     Cervical back: Neck supple. No tenderness.  Lymphadenopathy:     Cervical: No cervical adenopathy.  Skin:    Findings: No erythema or rash.  Neurological:     Mental Status: She is alert.  Psychiatric:        Mood and Affect: Mood normal.        Behavior: Behavior normal.         Outpatient Encounter Medications as of 09/25/2023  Medication Sig   azithromycin (ZITHROMAX) 250 MG tablet Take 2 tablets on day 1, then 1 tablet daily on days 2 through 5   amLODipine (NORVASC) 2.5 MG tablet Take 1 tablet (2.5 mg total) by mouth daily.   aspirin EC 81 MG tablet Take 81 mg by mouth daily.   atenolol (TENORMIN) 25 MG tablet Take 1 tablet (25 mg total) by mouth daily.   busPIRone (BUSPAR) 5 MG tablet Take 1 tablet (5 mg total) by mouth daily as needed.   Calcium Carbonate-Vitamin D 600-400 MG-UNIT per tablet Take 1 tablet by mouth daily.     hydrocortisone (CORTEF) 10 MG tablet SWISH AND EXPECTORATE TWO TEASPOONFULS THREE TIMES A DAY.   latanoprost (XALATAN) 0.005 % ophthalmic solution SMARTSIG:In Eye(s)   lisinopril (ZESTRIL) 20 MG tablet Take 1 tablet (20 mg total) by mouth daily.   Multiple  Vitamins-Minerals (ICAPS PO) Take by mouth.   mupirocin ointment (BACTROBAN) 2 % Apply to affected area twicd a day   Niacin (VITAMIN B-3 PO) Take by mouth.   timolol (BETIMOL) 0.5 % ophthalmic solution Place 1 drop into both eyes 2 (two) times daily.   valACYclovir (VALTREX) 1000 MG tablet Take 2 tablets twice a day for 1 day with each outbreak   [DISCONTINUED] amLODipine (NORVASC) 2.5 MG tablet Take 1 tablet (2.5 mg total) by mouth daily.   [DISCONTINUED] atenolol (TENORMIN) 25 MG tablet Take 1 tablet (25 mg total) by mouth daily.   [DISCONTINUED] lisinopril (ZESTRIL) 20 MG tablet Take 1 tablet (20 mg total) by mouth daily.   No facility-administered encounter medications on file as of 09/25/2023.     Lab Results  Component Value Date   WBC 6.1 04/05/2023   HGB 14.9 04/05/2023   HCT 44.3 04/05/2023   PLT 243.0 04/05/2023   GLUCOSE 109 (H) 09/20/2023   CHOL 174 09/20/2023   TRIG 80.0 09/20/2023   HDL 63.00 09/20/2023   LDLCALC 95 09/20/2023   ALT 16 09/20/2023   AST 19 09/20/2023   NA 139 09/20/2023   K 4.3 09/20/2023   CL 102 09/20/2023   CREATININE 0.76 09/20/2023   BUN 16 09/20/2023   CO2 31 09/20/2023   TSH 5.98 (H) 04/05/2023   INR 0.9 06/05/2018   HGBA1C 5.6 09/20/2023    MM 3D SCREENING MAMMOGRAM BILATERAL BREAST Result Date: 05/04/2023 CLINICAL DATA:  Screening. EXAM: DIGITAL SCREENING BILATERAL MAMMOGRAM WITH TOMOSYNTHESIS AND CAD TECHNIQUE: Bilateral screening digital craniocaudal and mediolateral oblique mammograms were obtained. Bilateral screening digital breast tomosynthesis was performed. The images were evaluated with computer-aided detection. COMPARISON:  Previous exam(s). ACR Breast Density Category c: The  breasts are heterogeneously dense, which may obscure small masses. FINDINGS: There are no findings suspicious for malignancy. IMPRESSION: No mammographic evidence of malignancy. A result letter of this screening mammogram will be mailed directly to the patient. RECOMMENDATION: Screening mammogram in one year. (Code:SM-B-01Y) BI-RADS CATEGORY  1: Negative. Electronically Signed   By: Clancy Crimes M.D.   On: 05/04/2023 13:59       Assessment & Plan:  Hypercholesteremia Assessment & Plan: Have discussed calculated cholesterol risk.  Desires no medication.  Follow lipid panel.   Orders: -     Lipid panel; Future -     Hepatic function panel; Future  Hyperglycemia Assessment & Plan: Follow met b and A1c.   Lab Results  Component Value Date   HGBA1C 5.6 09/20/2023     Orders: -     Hemoglobin A1c; Future  Essential hypertension, benign Assessment & Plan: Reviewed outside blood pressure readings - much better controlled. Continues on amlodipine, lisinopril and atenolol. Follow pressures. Follow metabolic panel. No change today.   Orders: -     Basic metabolic panel with GFR; Future  Stress Assessment & Plan: Increased stress discussed. Overall appears to be handling things well.  Has good support. Will notify me if feels she needs further intervention.    Upper respiratory tract infection, unspecified type Assessment & Plan: Symptoms as outlined.  Increased cough and congestion. Saline nasal spray and nasacort as directed. Delsym. Given persistent increased symptoms despite otc medications, concern regarding bacterial infection. Treat with zpak as directed. Follow. Call with update.    Other orders -     amLODIPine Besylate; Take 1 tablet (2.5 mg total) by mouth daily.  Dispense: 90 tablet; Refill: 1 -     Atenolol;  Take 1 tablet (25 mg total) by mouth daily.  Dispense: 90 tablet; Refill: 1 -     Lisinopril; Take 1 tablet (20 mg total) by mouth daily.  Dispense: 90 tablet;  Refill: 1 -     Azithromycin; Take 2 tablets on day 1, then 1 tablet daily on days 2 through 5  Dispense: 6 tablet; Refill: 0     Dellar Fenton, MD

## 2023-09-25 NOTE — Assessment & Plan Note (Signed)
Have discussed calculated cholesterol risk.  Desires no medication.  Follow lipid panel.  

## 2023-09-30 ENCOUNTER — Encounter: Payer: Self-pay | Admitting: Internal Medicine

## 2023-09-30 NOTE — Assessment & Plan Note (Signed)
 Follow met b and A1c.   Lab Results  Component Value Date   HGBA1C 5.6 09/20/2023

## 2023-09-30 NOTE — Assessment & Plan Note (Signed)
 Increased stress discussed. Overall appears to be handling things well.  Has good support. Will notify me if feels she needs further intervention.

## 2023-09-30 NOTE — Assessment & Plan Note (Signed)
 Symptoms as outlined.  Increased cough and congestion. Saline nasal spray and nasacort as directed. Delsym. Given persistent increased symptoms despite otc medications, concern regarding bacterial infection. Treat with zpak as directed. Follow. Call with update.

## 2023-09-30 NOTE — Assessment & Plan Note (Signed)
 Reviewed outside blood pressure readings - much better controlled. Continues on amlodipine, lisinopril and atenolol. Follow pressures. Follow metabolic panel. No change today.

## 2023-10-11 DIAGNOSIS — H40153 Residual stage of open-angle glaucoma, bilateral: Secondary | ICD-10-CM | POA: Diagnosis not present

## 2023-11-22 DIAGNOSIS — D2272 Melanocytic nevi of left lower limb, including hip: Secondary | ICD-10-CM | POA: Diagnosis not present

## 2023-11-22 DIAGNOSIS — D225 Melanocytic nevi of trunk: Secondary | ICD-10-CM | POA: Diagnosis not present

## 2023-11-22 DIAGNOSIS — C44729 Squamous cell carcinoma of skin of left lower limb, including hip: Secondary | ICD-10-CM | POA: Diagnosis not present

## 2023-11-22 DIAGNOSIS — D485 Neoplasm of uncertain behavior of skin: Secondary | ICD-10-CM | POA: Diagnosis not present

## 2023-11-22 DIAGNOSIS — D2262 Melanocytic nevi of left upper limb, including shoulder: Secondary | ICD-10-CM | POA: Diagnosis not present

## 2023-11-22 DIAGNOSIS — D2271 Melanocytic nevi of right lower limb, including hip: Secondary | ICD-10-CM | POA: Diagnosis not present

## 2023-11-22 DIAGNOSIS — C44722 Squamous cell carcinoma of skin of right lower limb, including hip: Secondary | ICD-10-CM | POA: Diagnosis not present

## 2023-11-22 DIAGNOSIS — L821 Other seborrheic keratosis: Secondary | ICD-10-CM | POA: Diagnosis not present

## 2023-11-22 DIAGNOSIS — D2261 Melanocytic nevi of right upper limb, including shoulder: Secondary | ICD-10-CM | POA: Diagnosis not present

## 2024-01-09 DIAGNOSIS — C44722 Squamous cell carcinoma of skin of right lower limb, including hip: Secondary | ICD-10-CM | POA: Diagnosis not present

## 2024-01-09 DIAGNOSIS — C44729 Squamous cell carcinoma of skin of left lower limb, including hip: Secondary | ICD-10-CM | POA: Diagnosis not present

## 2024-01-16 ENCOUNTER — Ambulatory Visit

## 2024-01-23 ENCOUNTER — Other Ambulatory Visit

## 2024-01-25 ENCOUNTER — Ambulatory Visit: Admitting: Internal Medicine

## 2024-02-06 DIAGNOSIS — C44729 Squamous cell carcinoma of skin of left lower limb, including hip: Secondary | ICD-10-CM | POA: Diagnosis not present

## 2024-02-06 DIAGNOSIS — D0461 Carcinoma in situ of skin of right upper limb, including shoulder: Secondary | ICD-10-CM | POA: Diagnosis not present

## 2024-02-06 DIAGNOSIS — D485 Neoplasm of uncertain behavior of skin: Secondary | ICD-10-CM | POA: Diagnosis not present

## 2024-02-12 DIAGNOSIS — H40153 Residual stage of open-angle glaucoma, bilateral: Secondary | ICD-10-CM | POA: Diagnosis not present

## 2024-02-14 ENCOUNTER — Other Ambulatory Visit (INDEPENDENT_AMBULATORY_CARE_PROVIDER_SITE_OTHER)

## 2024-02-14 DIAGNOSIS — I10 Essential (primary) hypertension: Secondary | ICD-10-CM | POA: Diagnosis not present

## 2024-02-14 DIAGNOSIS — R739 Hyperglycemia, unspecified: Secondary | ICD-10-CM | POA: Diagnosis not present

## 2024-02-14 DIAGNOSIS — E7800 Pure hypercholesterolemia, unspecified: Secondary | ICD-10-CM

## 2024-02-14 LAB — HEMOGLOBIN A1C: Hgb A1c MFr Bld: 5.8 % (ref 4.6–6.5)

## 2024-02-14 LAB — LIPID PANEL
Cholesterol: 185 mg/dL (ref 0–200)
HDL: 61.3 mg/dL (ref >39.00)
LDL Cholesterol: 106 mg/dL — ABNORMAL HIGH (ref 0–99)
NonHDL: 123.79
Total CHOL/HDL Ratio: 3
Triglycerides: 88 mg/dL (ref 0.0–149.0)
VLDL: 17.6 mg/dL (ref 0.0–40.0)

## 2024-02-14 LAB — HEPATIC FUNCTION PANEL
ALT: 12 U/L (ref 0–35)
AST: 18 U/L (ref 0–37)
Albumin: 4.3 g/dL (ref 3.5–5.2)
Alkaline Phosphatase: 86 U/L (ref 39–117)
Bilirubin, Direct: 0.1 mg/dL (ref 0.0–0.3)
Total Bilirubin: 0.6 mg/dL (ref 0.2–1.2)
Total Protein: 6.7 g/dL (ref 6.0–8.3)

## 2024-02-14 LAB — BASIC METABOLIC PANEL WITH GFR
BUN: 12 mg/dL (ref 6–23)
CO2: 32 meq/L (ref 19–32)
Calcium: 9.4 mg/dL (ref 8.4–10.5)
Chloride: 101 meq/L (ref 96–112)
Creatinine, Ser: 0.79 mg/dL (ref 0.40–1.20)
GFR: 66.02 mL/min (ref >60.00)
Glucose, Bld: 111 mg/dL — ABNORMAL HIGH (ref 70–99)
Potassium: 4.5 meq/L (ref 3.5–5.1)
Sodium: 140 meq/L (ref 135–145)

## 2024-02-15 ENCOUNTER — Ambulatory Visit: Payer: Self-pay | Admitting: Internal Medicine

## 2024-02-21 ENCOUNTER — Ambulatory Visit (INDEPENDENT_AMBULATORY_CARE_PROVIDER_SITE_OTHER): Admitting: Internal Medicine

## 2024-02-21 VITALS — BP 125/77 | HR 88 | Resp 16 | Ht 63.0 in | Wt 110.0 lb

## 2024-02-21 DIAGNOSIS — R739 Hyperglycemia, unspecified: Secondary | ICD-10-CM

## 2024-02-21 DIAGNOSIS — E7800 Pure hypercholesterolemia, unspecified: Secondary | ICD-10-CM | POA: Diagnosis not present

## 2024-02-21 DIAGNOSIS — I10 Essential (primary) hypertension: Secondary | ICD-10-CM | POA: Diagnosis not present

## 2024-02-21 DIAGNOSIS — F439 Reaction to severe stress, unspecified: Secondary | ICD-10-CM | POA: Diagnosis not present

## 2024-02-21 DIAGNOSIS — E559 Vitamin D deficiency, unspecified: Secondary | ICD-10-CM | POA: Diagnosis not present

## 2024-02-21 NOTE — Progress Notes (Signed)
 Subjective:    Patient ID: Claire Clayton, female    DOB: 17-Sep-1931, 89 y.o.   MRN: [id number]  Patient here for  Chief Complaint  Patient presents with   Medical Management of Chronic Issues    HPI Here for a scheduled follow up -  follow up regarding hypertension and increased stress. Increased stress recently. Lost money through a scam. Discussed. She is overall handling things relatively well. Still with increased family stress as well. Does not feel needs further intervention. Reviewed outside blood pressures and most averaging - 116-120s/70-80s. No chest pain. Breathing stable. No abdominal pain or bowel change.    Past Medical History:  Diagnosis Date   Cancer (HCC)    squamous cell skin   Diverticulosis    Glaucoma    Hypertension    Osteoporosis    Seasonal allergies    Past Surgical History:  Procedure Laterality Date   BREAST EXCISIONAL BIOPSY Left 1970   benign   CATARACT EXTRACTION, BILATERAL     SKIN CANCER EXCISION     carcinoma (3 surgeries)   TONSILLECTOMY  1948   Family History  Problem Relation Age of Onset   Colon cancer Brother    Colon polyps Sister    Breast cancer Neg Hx    Social History   Socioeconomic History   Marital status: Widowed    Spouse name: Not on file   Number of children: 3   Years of education: Not on file   Highest education level: Not on file  Occupational History   Not on file  Tobacco Use   Smoking status: Never   Smokeless tobacco: Never  Substance and Sexual Activity   Alcohol use: Yes    Alcohol/week: 0.0 standard drinks of alcohol    Comment: rare - glass of wine   Drug use: No   Sexual activity: Never  Other Topics Concern   Not on file  Social History Narrative   Not on file   Social Drivers of Health   Financial Resource Strain: Low Risk  (10/12/2020)   Overall Financial Resource Strain (CARDIA)    Difficulty of Paying Living Expenses: Not hard at all  Food Insecurity: No Food Insecurity  (10/12/2020)   Hunger Vital Sign    Worried About Running Out of Food in the Last Year: Never true    Ran Out of Food in the Last Year: Never true  Transportation Needs: No Transportation Needs (10/12/2020)   PRAPARE - Administrator, Civil Service (Medical): No    Lack of Transportation (Non-Medical): No  Physical Activity: Not on file  Stress: No Stress Concern Present (10/12/2020)   [employer] of Occupational Health - Occupational Stress Questionnaire    Feeling of Stress : Not at all  Social Connections: Unknown (10/12/2020)   Social Connection and Isolation Panel    Frequency of Communication with Friends and Family: More than three times a week    Frequency of Social Gatherings with Friends and Family: More than three times a week    Attends Religious Services: More than 4 times per year    Active Member of [employer] or Organizations: Not on file    Attends Banker Meetings: Not on file    Marital Status: Not on file     Review of Systems  Constitutional:  Negative for appetite change and unexpected weight change.  HENT:  Negative for congestion and sinus pressure.   Respiratory:  Negative for cough, chest  tightness and shortness of breath.   Cardiovascular:  Negative for chest pain, palpitations and leg swelling.  Gastrointestinal:  Negative for abdominal pain, diarrhea, nausea and vomiting.  Genitourinary:  Negative for difficulty urinating and dysuria.  Musculoskeletal:  Negative for joint swelling and myalgias.  Skin:  Negative for color change and rash.  Neurological:  Negative for dizziness and headaches.  Psychiatric/Behavioral:  Negative for agitation and dysphoric mood.        Increased stress as outlined.        Objective:     BP 125/77   Pulse 88   Resp 16   Ht 5' 3 (1.6 m)   Wt 110 lb (49.9 kg)   SpO2 99%   BMI 19.49 kg/m  Wt Readings from Last 3 Encounters:  02/21/24 110 lb (49.9 kg)  09/25/23 111 lb 9.6 oz (50.6 kg)   04/10/23 109 lb 6.4 oz (49.6 kg)    Physical Exam Vitals reviewed.  Constitutional:      General: She is not in acute distress.    Appearance: Normal appearance.  HENT:     Head: Normocephalic and atraumatic.     Right Ear: External ear normal.     Left Ear: External ear normal.     Mouth/Throat:     Pharynx: No oropharyngeal exudate or posterior oropharyngeal erythema.  Eyes:     General: No scleral icterus.       Right eye: No discharge.        Left eye: No discharge.     Conjunctiva/sclera: Conjunctivae normal.  Neck:     Thyroid : No thyromegaly.  Cardiovascular:     Rate and Rhythm: Normal rate and regular rhythm.  Pulmonary:     Effort: No respiratory distress.     Breath sounds: Normal breath sounds. No wheezing.  Abdominal:     General: Bowel sounds are normal.     Palpations: Abdomen is soft.     Tenderness: There is no abdominal tenderness.  Musculoskeletal:        General: No swelling or tenderness.     Cervical back: Neck supple. No tenderness.  Lymphadenopathy:     Cervical: No cervical adenopathy.  Skin:    Findings: No erythema or rash.  Neurological:     Mental Status: She is alert.  Psychiatric:        Mood and Affect: Mood normal.        Behavior: Behavior normal.         Outpatient Encounter Medications as of 02/21/2024  Medication Sig   amLODipine  (NORVASC ) 2.5 MG tablet Take 1 tablet (2.5 mg total) by mouth daily.   aspirin EC 81 MG tablet Take 81 mg by mouth daily.   atenolol  (TENORMIN ) 25 MG tablet Take 1 tablet (25 mg total) by mouth daily.   busPIRone  (BUSPAR ) 5 MG tablet Take 1 tablet (5 mg total) by mouth daily as needed.   Calcium Carbonate-Vitamin D  600-400 MG-UNIT per tablet Take 1 tablet by mouth daily.    hydrocortisone (CORTEF) 10 MG tablet SWISH AND EXPECTORATE TWO TEASPOONFULS THREE TIMES A DAY.   latanoprost (XALATAN) 0.005 % ophthalmic solution SMARTSIG:In Eye(s)   lisinopril  (ZESTRIL ) 20 MG tablet Take 1 tablet (20 mg  total) by mouth daily.   Multiple Vitamins-Minerals (ICAPS PO) Take by mouth.   mupirocin  ointment (BACTROBAN ) 2 % Apply to affected area twicd a day   Niacin (VITAMIN B-3 PO) Take by mouth.   timolol (BETIMOL) 0.5 % ophthalmic solution Place  1 drop into both eyes 2 (two) times daily.   valACYclovir (VALTREX) 1000 MG tablet Take 2 tablets twice a day for 1 day with each outbreak   No facility-administered encounter medications on file as of 02/21/2024.     Lab Results  Component Value Date   WBC 6.1 04/05/2023   HGB 14.9 04/05/2023   HCT 44.3 04/05/2023   PLT 243.0 04/05/2023   GLUCOSE 111 (H) 02/14/2024   CHOL 185 02/14/2024   TRIG 88.0 02/14/2024   HDL 61.30 02/14/2024   LDLCALC 106 (H) 02/14/2024   ALT 12 02/14/2024   AST 18 02/14/2024   NA 140 02/14/2024   K 4.5 02/14/2024   CL 101 02/14/2024   CREATININE 0.79 02/14/2024   BUN 12 02/14/2024   CO2 32 02/14/2024   TSH 5.98 (H) 04/05/2023   INR 0.9 06/05/2018   HGBA1C 5.8 02/14/2024    MM 3D SCREENING MAMMOGRAM BILATERAL BREAST Result Date: 05/04/2023 CLINICAL DATA:  Screening. EXAM: DIGITAL SCREENING BILATERAL MAMMOGRAM WITH TOMOSYNTHESIS AND CAD TECHNIQUE: Bilateral screening digital craniocaudal and mediolateral oblique mammograms were obtained. Bilateral screening digital breast tomosynthesis was performed. The images were evaluated with computer-aided detection. COMPARISON:  Previous exam(s). ACR Breast Density Category c: The breasts are heterogeneously dense, which may obscure small masses. FINDINGS: There are no findings suspicious for malignancy. IMPRESSION: No mammographic evidence of malignancy. A result letter of this screening mammogram will be mailed directly to the patient. RECOMMENDATION: Screening mammogram in one year. (Code:SM-B-01Y) BI-RADS CATEGORY  1: Negative. Electronically Signed   By: Corean Salter M.D.   On: 05/04/2023 13:59       Assessment & Plan:  Essential hypertension, benign Assessment &  Plan: Reviewed outside blood pressure readings - readings as outlined. Continues on amlodipine , lisinopril  and atenolol . Follow pressures. Follow metabolic panel. No change today.    Vitamin D  deficiency Assessment & Plan: Check vitamin D  level wit next labs.    Stress Assessment & Plan: Increased stress as outlined. Has good support. Does not feel needs any further intervention at this time. Follow.    Hyperglycemia Assessment & Plan: Follow met b and A1c.   Lab Results  Component Value Date   HGBA1C 5.8 02/14/2024      Hypercholesteremia Assessment & Plan: Have discussed calculated cholesterol risk.  Desires no medication.  Follow lipid panel.       Allena Hamilton, MD

## 2024-03-02 ENCOUNTER — Encounter: Payer: Self-pay | Admitting: Internal Medicine

## 2024-03-02 NOTE — Assessment & Plan Note (Signed)
Increased stress as outlined.  Has good support.  Does not feel needs any further intervention at this time.  Follow.

## 2024-03-02 NOTE — Assessment & Plan Note (Signed)
Have discussed calculated cholesterol risk.  Desires no medication.  Follow lipid panel.  

## 2024-03-02 NOTE — Assessment & Plan Note (Signed)
 Check vitamin D  level wit next labs.

## 2024-03-02 NOTE — Assessment & Plan Note (Signed)
 Follow met b and A1c.   Lab Results  Component Value Date   HGBA1C 5.8 02/14/2024

## 2024-03-02 NOTE — Assessment & Plan Note (Signed)
 Reviewed outside blood pressure readings - readings as outlined. Continues on amlodipine , lisinopril  and atenolol . Follow pressures. Follow metabolic panel. No change today.

## 2024-03-03 DIAGNOSIS — D485 Neoplasm of uncertain behavior of skin: Secondary | ICD-10-CM | POA: Diagnosis not present

## 2024-03-03 DIAGNOSIS — D045 Carcinoma in situ of skin of trunk: Secondary | ICD-10-CM | POA: Diagnosis not present

## 2024-03-03 DIAGNOSIS — C44729 Squamous cell carcinoma of skin of left lower limb, including hip: Secondary | ICD-10-CM | POA: Diagnosis not present

## 2024-03-12 ENCOUNTER — Encounter: Payer: Self-pay | Admitting: Internal Medicine

## 2024-03-12 DIAGNOSIS — H409 Unspecified glaucoma: Secondary | ICD-10-CM | POA: Diagnosis not present

## 2024-03-12 DIAGNOSIS — D649 Anemia, unspecified: Secondary | ICD-10-CM | POA: Diagnosis not present

## 2024-03-12 DIAGNOSIS — Z791 Long term (current) use of non-steroidal anti-inflammatories (NSAID): Secondary | ICD-10-CM | POA: Diagnosis not present

## 2024-03-12 DIAGNOSIS — K559 Vascular disorder of intestine, unspecified: Secondary | ICD-10-CM | POA: Diagnosis not present

## 2024-03-12 DIAGNOSIS — K5730 Diverticulosis of large intestine without perforation or abscess without bleeding: Secondary | ICD-10-CM | POA: Diagnosis not present

## 2024-03-12 DIAGNOSIS — R197 Diarrhea, unspecified: Secondary | ICD-10-CM | POA: Diagnosis not present

## 2024-03-12 DIAGNOSIS — R933 Abnormal findings on diagnostic imaging of other parts of digestive tract: Secondary | ICD-10-CM | POA: Diagnosis not present

## 2024-03-12 DIAGNOSIS — I10 Essential (primary) hypertension: Secondary | ICD-10-CM | POA: Diagnosis not present

## 2024-03-12 DIAGNOSIS — R109 Unspecified abdominal pain: Secondary | ICD-10-CM | POA: Diagnosis not present

## 2024-03-12 DIAGNOSIS — K529 Noninfective gastroenteritis and colitis, unspecified: Secondary | ICD-10-CM | POA: Diagnosis not present

## 2024-03-12 DIAGNOSIS — E860 Dehydration: Secondary | ICD-10-CM | POA: Diagnosis not present

## 2024-03-12 DIAGNOSIS — Z66 Do not resuscitate: Secondary | ICD-10-CM | POA: Diagnosis not present

## 2024-03-12 DIAGNOSIS — K6389 Other specified diseases of intestine: Secondary | ICD-10-CM | POA: Diagnosis not present

## 2024-03-12 DIAGNOSIS — K649 Unspecified hemorrhoids: Secondary | ICD-10-CM | POA: Diagnosis not present

## 2024-03-12 DIAGNOSIS — K921 Melena: Secondary | ICD-10-CM | POA: Diagnosis not present

## 2024-03-12 DIAGNOSIS — D72829 Elevated white blood cell count, unspecified: Secondary | ICD-10-CM | POA: Diagnosis not present

## 2024-03-12 DIAGNOSIS — K922 Gastrointestinal hemorrhage, unspecified: Secondary | ICD-10-CM | POA: Diagnosis not present

## 2024-03-12 DIAGNOSIS — R112 Nausea with vomiting, unspecified: Secondary | ICD-10-CM | POA: Diagnosis not present

## 2024-03-12 DIAGNOSIS — E871 Hypo-osmolality and hyponatremia: Secondary | ICD-10-CM | POA: Diagnosis not present

## 2024-03-12 NOTE — Telephone Encounter (Signed)
 Called patient. She is going to the ED to be evaluated.

## 2024-03-14 DIAGNOSIS — K5730 Diverticulosis of large intestine without perforation or abscess without bleeding: Secondary | ICD-10-CM | POA: Diagnosis not present

## 2024-03-14 DIAGNOSIS — I10 Essential (primary) hypertension: Secondary | ICD-10-CM | POA: Diagnosis not present

## 2024-03-14 DIAGNOSIS — K649 Unspecified hemorrhoids: Secondary | ICD-10-CM | POA: Diagnosis not present

## 2024-03-14 DIAGNOSIS — K6389 Other specified diseases of intestine: Secondary | ICD-10-CM | POA: Diagnosis not present

## 2024-03-14 DIAGNOSIS — K921 Melena: Secondary | ICD-10-CM | POA: Diagnosis not present

## 2024-03-14 DIAGNOSIS — K559 Vascular disorder of intestine, unspecified: Secondary | ICD-10-CM | POA: Diagnosis not present

## 2024-03-15 NOTE — Telephone Encounter (Signed)
 Please scheduled ED F/U

## 2024-03-17 ENCOUNTER — Telehealth: Payer: Self-pay

## 2024-03-17 NOTE — Transitions of Care (Post Inpatient/ED Visit) (Signed)
 03/17/2024  Name: Claire Clayton MRN: [id number] DOB: 1932-02-04  Today's TOC FU Call Status: Today's TOC FU Call Status:: Successful TOC FU Call Completed TOC FU Call Complete Date: 03/17/24 Patient's Name and Date of Birth confirmed.  Transition Care Management Follow-up Telephone Call Date of Discharge: 03/14/24 Discharge Facility: Other [facility]) Name of Other (Non[facility]) Discharge Facility: [facility] Type of Discharge: Inpatient Admission Primary Inpatient Discharge Diagnosis:: gastroenteritis How have you been since you were released from the hospital?: Better Any questions or concerns?: No  Items Reviewed: Did you receive and understand the discharge instructions provided?: Yes Medications obtained,verified, and reconciled?: Yes (Medications Reviewed) Any new allergies since your discharge?: No Dietary orders reviewed?: Yes Do you have support at home?: Yes People in Home [RPT]: child(ren), adult  Medications Reviewed Today: Medications Reviewed Today     Reviewed by Emmitt Pan, LPN (Licensed Practical Nurse) on 03/17/24 at 1004  Med List Status: <None>   Medication Order Taking? Sig Documenting Provider Last Dose Status Informant  amLODipine  (NORVASC ) 2.5 MG tablet [id number] Yes Take 1 tablet (2.5 mg total) by mouth daily. Glendia Shad, MD  Active   aspirin EC 81 MG tablet [id number]  Take 81 mg by mouth daily.  Patient not taking: Reported on 03/17/2024   [provider]  Active   atenolol  (TENORMIN ) 25 MG tablet [id number]  Take 1 tablet (25 mg total) by mouth daily.  Patient not taking: Reported on 03/17/2024   Glendia Shad, MD  Active   busPIRone  (BUSPAR ) 5 MG tablet [id number] Yes Take 1 tablet (5 mg total) by mouth daily as needed. Glendia Shad, MD  Active   Calcium Carbonate-Vitamin D  600-400 MG-UNIT per tablet [id number] Yes Take 1 tablet by mouth daily.  [provider]  Active   hydrocortisone (CORTEF) 10 MG tablet [id number]  Yes SWISH AND EXPECTORATE TWO TEASPOONFULS THREE TIMES A DAY. Glendia Shad, MD  Active   latanoprost (XALATAN) 0.005 % ophthalmic solution [id number] Yes SMARTSIG:In Eye(s) [provider]  Active   lisinopril  (ZESTRIL ) 20 MG tablet [id number]  Take 1 tablet (20 mg total) by mouth daily.  Patient not taking: Reported on 03/17/2024   Glendia Shad, MD  Active   Multiple Vitamins-Minerals (ICAPS PO) [id number] Yes Take by mouth. [provider]  Active   mupirocin  ointment (BACTROBAN ) 2 % [id number] Yes Apply to affected area twicd a day Glendia Shad, MD  Active            Med Note GAYLEN LAURAINE FORBES Pablo Oct 24, 2021  1:04 PM) PRN  Niacin (VITAMIN B-3 PO) [id number] Yes Take by mouth. [provider]  Active   timolol (BETIMOL) 0.5 % ophthalmic solution [id number] Yes Place 1 drop into both eyes 2 (two) times daily. [provider]  Active   valACYclovir (VALTREX) 1000 MG tablet [id number] Yes Take 2 tablets twice a day for 1 day with each outbreak [provider]  Active             Home Care and Equipment/Supplies: Were Home Health Services Ordered?: NA Any new equipment or medical supplies ordered?: NA  Functional Questionnaire: Do you need assistance with bathing/showering or dressing?: No Do you need assistance with meal preparation?: No Do you need assistance with eating?: No Do you have difficulty maintaining continence: No Do you need assistance with getting out of bed/getting out of a chair/moving?: No Do you have difficulty managing or taking your medications?: No  Follow up appointments reviewed:  PCP Follow-up appointment confirmed?: Yes Date of PCP follow-up appointment?: 03/31/24 Follow-up Provider: [facility] Follow-up appointment confirmed?: NA Do you need transportation to your follow-up appointment?: No Do you understand care options if your condition(s) worsen?: Yes-patient verbalized  understanding    SIGNATURE Julian Lemmings, LPN [facility] Nurse Health Advisor Direct Dial [phone]

## 2024-03-17 NOTE — Telephone Encounter (Signed)
 I can see her for hospital follow up 03/28/24. Please move to this day. Thanks.

## 2024-03-18 NOTE — Telephone Encounter (Signed)
 Appt rescheduled

## 2024-03-24 DIAGNOSIS — C44729 Squamous cell carcinoma of skin of left lower limb, including hip: Secondary | ICD-10-CM | POA: Diagnosis not present

## 2024-03-24 DIAGNOSIS — C44722 Squamous cell carcinoma of skin of right lower limb, including hip: Secondary | ICD-10-CM | POA: Diagnosis not present

## 2024-03-24 DIAGNOSIS — D485 Neoplasm of uncertain behavior of skin: Secondary | ICD-10-CM | POA: Diagnosis not present

## 2024-03-28 ENCOUNTER — Encounter: Payer: Self-pay | Admitting: Internal Medicine

## 2024-03-28 ENCOUNTER — Ambulatory Visit: Admitting: Internal Medicine

## 2024-03-28 VITALS — BP 154/88 | HR 92 | Ht 63.0 in | Wt 110.2 lb

## 2024-03-28 DIAGNOSIS — E7800 Pure hypercholesterolemia, unspecified: Secondary | ICD-10-CM | POA: Diagnosis not present

## 2024-03-28 DIAGNOSIS — I10 Essential (primary) hypertension: Secondary | ICD-10-CM

## 2024-03-28 DIAGNOSIS — Z23 Encounter for immunization: Secondary | ICD-10-CM

## 2024-03-28 DIAGNOSIS — K922 Gastrointestinal hemorrhage, unspecified: Secondary | ICD-10-CM | POA: Diagnosis not present

## 2024-03-28 DIAGNOSIS — R739 Hyperglycemia, unspecified: Secondary | ICD-10-CM

## 2024-03-28 LAB — CBC WITH DIFFERENTIAL/PLATELET
Basophils Absolute: 0 K/uL (ref 0.0–0.1)
Basophils Relative: 0.5 % (ref 0.0–3.0)
Eosinophils Absolute: 0.1 K/uL (ref 0.0–0.7)
Eosinophils Relative: 1.1 % (ref 0.0–5.0)
HCT: 43 % (ref 36.0–46.0)
Hemoglobin: 14.5 g/dL (ref 12.0–15.0)
Lymphocytes Relative: 22 % (ref 12.0–46.0)
Lymphs Abs: 1.4 K/uL (ref 0.7–4.0)
MCHC: 33.8 g/dL (ref 30.0–36.0)
MCV: 94.9 fl (ref 78.0–100.0)
Monocytes Absolute: 0.5 K/uL (ref 0.1–1.0)
Monocytes Relative: 7.6 % (ref 3.0–12.0)
Neutro Abs: 4.4 K/uL (ref 1.4–7.7)
Neutrophils Relative %: 68.8 % (ref 43.0–77.0)
Platelets: 290 K/uL (ref 150.0–400.0)
RBC: 4.53 Mil/uL (ref 3.87–5.11)
RDW: 13.4 % (ref 11.5–15.5)
WBC: 6.4 K/uL (ref 4.0–10.5)

## 2024-03-28 LAB — BASIC METABOLIC PANEL WITH GFR
BUN: 16 mg/dL (ref 6–23)
CO2: 31 meq/L (ref 19–32)
Calcium: 9.4 mg/dL (ref 8.4–10.5)
Chloride: 99 meq/L (ref 96–112)
Creatinine, Ser: 0.73 mg/dL (ref 0.40–1.20)
GFR: 72.53 mL/min (ref >60.00)
Glucose, Bld: 102 mg/dL — ABNORMAL HIGH (ref 70–99)
Potassium: 4.9 meq/L (ref 3.5–5.1)
Sodium: 138 meq/L (ref 135–145)

## 2024-03-28 MED ORDER — AMLODIPINE BESYLATE 2.5 MG PO TABS: 2.5000 mg | ORAL_TABLET | Freq: Every day | ORAL | 1 refills | Status: DC

## 2024-03-28 NOTE — Patient Instructions (Signed)
 Start lisinopril  20mg  - take 1/2 tablet per day.

## 2024-03-28 NOTE — Progress Notes (Signed)
 Subjective:    Patient ID: Claire Clayton, female    DOB: 1932-04-09, 89 y.o.   MRN: [id number]  Patient here for  Chief Complaint  Patient presents with   Hospitalization Follow-up    HPI Here for hospital follow up - admitted 03/12/24 - 03/14/24 - GI bleed. Patient presented with one day of abdominal cramping, nausea, vomiting and numerous bloody bowel movements at home. CT abdomen/pelvis with contrast showed wall thickening distal transverse colon through the sigmoid colon with differential including infectious, inflammatory and ischemic etiologies. Hemoglobin initially dropped from 15.8 to 14 overnight, in the setting of ongoing bloody stools, although some of the drop was believed to be dilutional as she was receiving IV fluids. GI consulted and recommended colonoscopy. 03/14/24 - findings in the colonoscopy included: hemorrhoids, multiple medium diverticula in the sigmoid colon, narrowing of the signoid colon in association with the diverticular opening, segmental moderate inflammation characterized by erythema and friability and granularity in the descending colon and distal transverse colon which could be consistent with ischemia. Her atenolol  and lisinopril  were held. She is taking amlodipine . Since her discharge, she noticed one spot of BRB yesterday. Feels this was related to hemorrhoid. Bowels moving better. No abdominal pain. No chest pain or sob.    Past Medical History:  Diagnosis Date   Cancer (HCC)    squamous cell skin   Diverticulosis    Glaucoma    Hypertension    Osteoporosis    Seasonal allergies    Past Surgical History:  Procedure Laterality Date   BREAST EXCISIONAL BIOPSY Left 1970   benign   CATARACT EXTRACTION, BILATERAL     SKIN CANCER EXCISION     carcinoma (3 surgeries)   TONSILLECTOMY  1948   Family History  Problem Relation Age of Onset   Colon cancer Brother    Colon polyps Sister    Breast cancer Neg Hx    Social History   Socioeconomic  History   Marital status: Widowed    Spouse name: Not on file   Number of children: 3   Years of education: Not on file   Highest education level: Not on file  Occupational History   Not on file  Tobacco Use   Smoking status: Never   Smokeless tobacco: Never  Substance and Sexual Activity   Alcohol use: Yes    Alcohol/week: 0.0 standard drinks of alcohol    Comment: rare - glass of wine   Drug use: No   Sexual activity: Never  Other Topics Concern   Not on file  Social History Narrative   Not on file   Social Drivers of Health   Financial Resource Strain: Low Risk  (03/13/2024)   Received from [facility]   Overall Financial Resource Strain (CARDIA)    How hard is it for you to pay for the very basics like food, housing, medical care, and heating?: Not hard at all  Food Insecurity: No Food Insecurity (03/13/2024)   Received from [facility]   Hunger Vital Sign    Within the past 12 months, you worried that your food would run out before you got the money to buy more.: Never true    Within the past 12 months, the food you bought just didn't last and you didn't have money to get more.: Never true  Transportation Needs: No Transportation Needs (03/13/2024)   Received from [facility] - Transportation    Lack of Transportation (Medical):  No    Lack of Transportation (Non-Medical): No  Physical Activity: Not on file  Stress: No Stress Concern Present (10/12/2020)   [employer] of Occupational Health - Occupational Stress Questionnaire    Feeling of Stress : Not at all  Social Connections: Unknown (10/12/2020)   Social Connection and Isolation Panel    Frequency of Communication with Friends and Family: More than three times a week    Frequency of Social Gatherings with Friends and Family: More than three times a week    Attends Religious Services: More than 4 times per year    Active Member of [employer] or Organizations: Not on file    Attends Tax inspector Meetings: Not on file    Marital Status: Not on file     Review of Systems  Constitutional:  Negative for appetite change, fever and unexpected weight change.  HENT:  Negative for congestion and sinus pressure.   Respiratory:  Negative for cough, chest tightness and shortness of breath.   Cardiovascular:  Negative for chest pain and palpitations.  Gastrointestinal:  Negative for abdominal pain, nausea and vomiting.       BRB as outlined.   Genitourinary:  Negative for difficulty urinating and dysuria.  Musculoskeletal:  Negative for joint swelling and myalgias.  Skin:  Negative for color change and rash.  Neurological:  Negative for dizziness and headaches.  Psychiatric/Behavioral:  Negative for agitation and dysphoric mood.        Objective:     BP (!) 154/88   Pulse 92   Ht 5' 3 (1.6 m)   Wt 110 lb 3.2 oz (50 kg)   SpO2 98%   BMI 19.52 kg/m  Wt Readings from Last 3 Encounters:  03/28/24 110 lb 3.2 oz (50 kg)  02/21/24 110 lb (49.9 kg)  09/25/23 111 lb 9.6 oz (50.6 kg)    Physical Exam Vitals reviewed.  Constitutional:      General: She is not in acute distress.    Appearance: Normal appearance.  HENT:     Head: Normocephalic and atraumatic.     Right Ear: External ear normal.     Left Ear: External ear normal.     Mouth/Throat:     Pharynx: No oropharyngeal exudate or posterior oropharyngeal erythema.  Eyes:     General: No scleral icterus.       Right eye: No discharge.        Left eye: No discharge.     Conjunctiva/sclera: Conjunctivae normal.  Neck:     Thyroid : No thyromegaly.  Cardiovascular:     Rate and Rhythm: Normal rate and regular rhythm.  Pulmonary:     Effort: No respiratory distress.     Breath sounds: Normal breath sounds. No wheezing.  Abdominal:     General: Bowel sounds are normal.     Palpations: Abdomen is soft.     Tenderness: There is no abdominal tenderness.  Musculoskeletal:        General: No swelling or  tenderness.     Cervical back: Neck supple. No tenderness.  Lymphadenopathy:     Cervical: No cervical adenopathy.  Skin:    Findings: No erythema or rash.  Neurological:     Mental Status: She is alert.  Psychiatric:        Mood and Affect: Mood normal.        Behavior: Behavior normal.         Outpatient Encounter Medications as of 03/28/2024  Medication Sig  busPIRone  (BUSPAR ) 5 MG tablet Take 1 tablet (5 mg total) by mouth daily as needed.   Calcium Carbonate-Vitamin D  600-400 MG-UNIT per tablet Take 1 tablet by mouth daily.    hydrocortisone (CORTEF) 10 MG tablet SWISH AND EXPECTORATE TWO TEASPOONFULS THREE TIMES A DAY.   latanoprost (XALATAN) 0.005 % ophthalmic solution SMARTSIG:In Eye(s)   Multiple Vitamins-Minerals (ICAPS PO) Take by mouth.   mupirocin  ointment (BACTROBAN ) 2 % Apply to affected area twicd a day   Niacin (VITAMIN B-3 PO) Take by mouth.   timolol (BETIMOL) 0.5 % ophthalmic solution Place 1 drop into both eyes 2 (two) times daily.   valACYclovir (VALTREX) 1000 MG tablet Take 2 tablets twice a day for 1 day with each outbreak   [DISCONTINUED] amLODipine  (NORVASC ) 2.5 MG tablet Take 1 tablet (2.5 mg total) by mouth daily.   amLODipine  (NORVASC ) 2.5 MG tablet Take 1 tablet (2.5 mg total) by mouth daily.   aspirin EC 81 MG tablet Take 81 mg by mouth daily. (Patient not taking: Reported on 03/28/2024)   atenolol  (TENORMIN ) 25 MG tablet Take 1 tablet (25 mg total) by mouth daily. (Patient not taking: Reported on 03/28/2024)   lisinopril  (ZESTRIL ) 20 MG tablet Take 1 tablet (20 mg total) by mouth daily. (Patient not taking: Reported on 03/28/2024)   No facility-administered encounter medications on file as of 03/28/2024.     Lab Results  Component Value Date   WBC 6.4 03/28/2024   HGB 14.5 03/28/2024   HCT 43.0 03/28/2024   PLT 290.0 03/28/2024   GLUCOSE 102 (H) 03/28/2024   CHOL 185 02/14/2024   TRIG 88.0 02/14/2024   HDL 61.30 02/14/2024   LDLCALC 106  (H) 02/14/2024   ALT 12 02/14/2024   AST 18 02/14/2024   NA 138 03/28/2024   K 4.9 03/28/2024   CL 99 03/28/2024   CREATININE 0.73 03/28/2024   BUN 16 03/28/2024   CO2 31 03/28/2024   TSH 5.98 (H) 04/05/2023   INR 0.9 06/05/2018   HGBA1C 5.8 02/14/2024    MM 3D SCREENING MAMMOGRAM BILATERAL BREAST Result Date: 05/04/2023 CLINICAL DATA:  Screening. EXAM: DIGITAL SCREENING BILATERAL MAMMOGRAM WITH TOMOSYNTHESIS AND CAD TECHNIQUE: Bilateral screening digital craniocaudal and mediolateral oblique mammograms were obtained. Bilateral screening digital breast tomosynthesis was performed. The images were evaluated with computer-aided detection. COMPARISON:  Previous exam(s). ACR Breast Density Category c: The breasts are heterogeneously dense, which may obscure small masses. FINDINGS: There are no findings suspicious for malignancy. IMPRESSION: No mammographic evidence of malignancy. A result letter of this screening mammogram will be mailed directly to the patient. RECOMMENDATION: Screening mammogram in one year. (Code:SM-B-01Y) BI-RADS CATEGORY  1: Negative. Electronically Signed   By: Corean Salter M.D.   On: 05/04/2023 13:59       Assessment & Plan:  Essential hypertension, benign Assessment & Plan: Blood pressure significantly increased on presentation. Recheck improved, but still elevated. Reviewed outside readings - averaged 120-130/80-90. Currently only on amlodipine  2.5mg  q day. Will add back lisinopril  20mg  1/2 tablet per day. Follow pressures. Adjust medication as needed.   Orders: -     Basic metabolic panel with GFR  Gastrointestinal hemorrhage, unspecified gastrointestinal hemorrhage type Assessment & Plan: Recently admitted with GIB. CT and colonoscopy as outlined. W/up as outlined with unclear etiology - suspected ischemic injury. Bowels are moving now. Minimal BRB yesterday - she felt was related to hemorrhoid. No further bleeding. Discussed f/u with GI - agreeable. Prior  to her discharge, recommended zio monitor. She  is agreeable. Will order. Recheck cbc today.   Orders: -     CBC with Differential/Platelet -     Ambulatory referral to Gastroenterology -     LONG TERM MONITOR (3-14 DAYS); Future  Flu vaccine need -     Flu vaccine HIGH DOSE PF(Fluzone Trivalent)  Hypercholesteremia Assessment & Plan: Have discussed calculated cholesterol risk.  Desires no medication.  Follow lipid panel.    Hyperglycemia Assessment & Plan: Follow met b and A1c.   Lab Results  Component Value Date   HGBA1C 5.8 02/14/2024      Other orders -     amLODIPine  Besylate; Take 1 tablet (2.5 mg total) by mouth daily.  Dispense: 90 tablet; Refill: 1     Allena Hamilton, MD

## 2024-03-29 ENCOUNTER — Ambulatory Visit: Payer: Self-pay | Admitting: Internal Medicine

## 2024-03-29 ENCOUNTER — Encounter: Payer: Self-pay | Admitting: Internal Medicine

## 2024-03-29 NOTE — Assessment & Plan Note (Signed)
 Recently admitted with GIB. CT and colonoscopy as outlined. W/up as outlined with unclear etiology - suspected ischemic injury. Bowels are moving now. Minimal BRB yesterday - she felt was related to hemorrhoid. No further bleeding. Discussed f/u with GI - agreeable. Prior to her discharge, recommended zio monitor. She is agreeable. Will order. Recheck cbc today.

## 2024-03-29 NOTE — Assessment & Plan Note (Signed)
 Blood pressure significantly increased on presentation. Recheck improved, but still elevated. Reviewed outside readings - averaged 120-130/80-90. Currently only on amlodipine  2.5mg  q day. Will add back lisinopril  20mg  1/2 tablet per day. Follow pressures. Adjust medication as needed.

## 2024-03-29 NOTE — Assessment & Plan Note (Signed)
 Follow met b and A1c.   Lab Results  Component Value Date   HGBA1C 5.8 02/14/2024

## 2024-03-29 NOTE — Assessment & Plan Note (Signed)
Have discussed calculated cholesterol risk.  Desires no medication.  Follow lipid panel.  

## 2024-03-31 ENCOUNTER — Inpatient Hospital Stay: Admitting: Internal Medicine

## 2024-05-20 DIAGNOSIS — C44729 Squamous cell carcinoma of skin of left lower limb, including hip: Secondary | ICD-10-CM | POA: Diagnosis not present

## 2024-05-20 DIAGNOSIS — D485 Neoplasm of uncertain behavior of skin: Secondary | ICD-10-CM | POA: Diagnosis not present

## 2024-05-28 DIAGNOSIS — C44729 Squamous cell carcinoma of skin of left lower limb, including hip: Secondary | ICD-10-CM | POA: Diagnosis not present

## 2024-06-17 ENCOUNTER — Other Ambulatory Visit: Payer: Self-pay

## 2024-06-17 DIAGNOSIS — R739 Hyperglycemia, unspecified: Secondary | ICD-10-CM

## 2024-06-17 DIAGNOSIS — E7800 Pure hypercholesterolemia, unspecified: Secondary | ICD-10-CM

## 2024-06-17 DIAGNOSIS — I10 Essential (primary) hypertension: Secondary | ICD-10-CM

## 2024-06-18 NOTE — Addendum Note (Signed)
 Addended by: GLENDIA ALLENA RAMAN on: 06/18/2024 05:31 AM   Modules accepted: Orders

## 2024-06-18 NOTE — Progress Notes (Signed)
 Order future lab - tsh to be drawn with met b, A1c, lipid panel, liver panel and cbc.

## 2024-06-26 ENCOUNTER — Other Ambulatory Visit (INDEPENDENT_AMBULATORY_CARE_PROVIDER_SITE_OTHER)

## 2024-06-26 ENCOUNTER — Ambulatory Visit: Payer: Self-pay | Admitting: Internal Medicine

## 2024-06-26 DIAGNOSIS — E7800 Pure hypercholesterolemia, unspecified: Secondary | ICD-10-CM

## 2024-06-26 DIAGNOSIS — R739 Hyperglycemia, unspecified: Secondary | ICD-10-CM

## 2024-06-26 DIAGNOSIS — I10 Essential (primary) hypertension: Secondary | ICD-10-CM

## 2024-06-26 LAB — CBC WITH DIFFERENTIAL/PLATELET
Basophils Absolute: 0 K/uL (ref 0.0–0.1)
Basophils Relative: 0.4 % (ref 0.0–3.0)
Eosinophils Absolute: 0.2 K/uL (ref 0.0–0.7)
Eosinophils Relative: 3.1 % (ref 0.0–5.0)
HCT: 41.5 % (ref 36.0–46.0)
Hemoglobin: 14.3 g/dL (ref 12.0–15.0)
Lymphocytes Relative: 31 % (ref 12.0–46.0)
Lymphs Abs: 1.9 K/uL (ref 0.7–4.0)
MCHC: 34.3 g/dL (ref 30.0–36.0)
MCV: 92.1 fl (ref 78.0–100.0)
Monocytes Absolute: 0.6 K/uL (ref 0.1–1.0)
Monocytes Relative: 10.2 % (ref 3.0–12.0)
Neutro Abs: 3.4 K/uL (ref 1.4–7.7)
Neutrophils Relative %: 55.3 % (ref 43.0–77.0)
Platelets: 230 K/uL (ref 150.0–400.0)
RBC: 4.51 Mil/uL (ref 3.87–5.11)
RDW: 13 % (ref 11.5–15.5)
WBC: 6.2 K/uL (ref 4.0–10.5)

## 2024-06-26 LAB — BASIC METABOLIC PANEL WITH GFR
BUN: 18 mg/dL (ref 6–23)
CO2: 30 meq/L (ref 19–32)
Calcium: 9.3 mg/dL (ref 8.4–10.5)
Chloride: 103 meq/L (ref 96–112)
Creatinine, Ser: 0.76 mg/dL (ref 0.40–1.20)
GFR: 68.99 mL/min
Glucose, Bld: 111 mg/dL — ABNORMAL HIGH (ref 70–99)
Potassium: 4.8 meq/L (ref 3.5–5.1)
Sodium: 139 meq/L (ref 135–145)

## 2024-06-26 LAB — LIPID PANEL
Cholesterol: 185 mg/dL (ref 28–200)
HDL: 64.3 mg/dL
LDL Cholesterol: 101 mg/dL — ABNORMAL HIGH (ref 10–99)
NonHDL: 120.85
Total CHOL/HDL Ratio: 3
Triglycerides: 97 mg/dL (ref 10.0–149.0)
VLDL: 19.4 mg/dL (ref 0.0–40.0)

## 2024-06-26 LAB — HEPATIC FUNCTION PANEL
ALT: 13 U/L (ref 3–35)
AST: 17 U/L (ref 5–37)
Albumin: 4.2 g/dL (ref 3.5–5.2)
Alkaline Phosphatase: 91 U/L (ref 39–117)
Bilirubin, Direct: 0.1 mg/dL (ref 0.1–0.3)
Total Bilirubin: 0.6 mg/dL (ref 0.2–1.2)
Total Protein: 6.4 g/dL (ref 6.0–8.3)

## 2024-06-26 LAB — TSH: TSH: 5.61 u[IU]/mL — ABNORMAL HIGH (ref 0.35–5.50)

## 2024-06-26 LAB — HEMOGLOBIN A1C: Hgb A1c MFr Bld: 5.5 % (ref 4.6–6.5)

## 2024-06-30 ENCOUNTER — Ambulatory Visit: Admitting: Internal Medicine

## 2024-06-30 ENCOUNTER — Encounter: Payer: Self-pay | Admitting: Internal Medicine

## 2024-06-30 VITALS — BP 160/84 | HR 96 | Temp 98.1°F | Ht 63.0 in | Wt 109.4 lb

## 2024-06-30 DIAGNOSIS — F439 Reaction to severe stress, unspecified: Secondary | ICD-10-CM

## 2024-06-30 DIAGNOSIS — E7800 Pure hypercholesterolemia, unspecified: Secondary | ICD-10-CM

## 2024-06-30 DIAGNOSIS — R739 Hyperglycemia, unspecified: Secondary | ICD-10-CM | POA: Diagnosis not present

## 2024-06-30 DIAGNOSIS — R7989 Other specified abnormal findings of blood chemistry: Secondary | ICD-10-CM

## 2024-06-30 DIAGNOSIS — I10 Essential (primary) hypertension: Secondary | ICD-10-CM | POA: Diagnosis not present

## 2024-06-30 DIAGNOSIS — E559 Vitamin D deficiency, unspecified: Secondary | ICD-10-CM

## 2024-06-30 DIAGNOSIS — K922 Gastrointestinal hemorrhage, unspecified: Secondary | ICD-10-CM

## 2024-06-30 DIAGNOSIS — M810 Age-related osteoporosis without current pathological fracture: Secondary | ICD-10-CM

## 2024-06-30 MED ORDER — AMLODIPINE BESYLATE 2.5 MG PO TABS: 2.5000 mg | ORAL_TABLET | Freq: Every day | ORAL | 1 refills | Status: AC

## 2024-06-30 NOTE — Progress Notes (Addendum)
 "  Subjective:    Patient ID: Claire Clayton, female    DOB: Mar 04, 1932, 89 y.o.   MRN: [id number]  Patient here for  Chief Complaint  Patient presents with   Medical Management of Chronic Issues   Hypertension    HPI Here for a scheduled follow up - follow up regarding her blood pressure. Was admitted 03/12/24 - 03/14/24 with GI bleed. See last note for details. GI consulted and recommended colonoscopy. 03/14/24 - findings in the colonoscopy included: hemorrhoids, multiple medium diverticula in the sigmoid colon, narrowing of the signoid colon in association with the diverticular opening, segmental moderate inflammation characterized by erythema and friability and granularity in the descending colon and distal transverse colon which could be consistent with ischemia. Currently on amlodipine  and lisinopril  added back last visit. Was on hold from hospitalization. Was referred to GI. Has appt scheduled in 08/2024. Overall doing relatively well. Some increased stress. Discussed. Does not feel needs any further intervention. Appears to be handling things well. No chest pain or sob reported. Reviewed outside blood pressure readings - averaging 120s/80s.    Past Medical History:  Diagnosis Date   Cancer (HCC)    squamous cell skin   Diverticulosis    Glaucoma    Hypertension    Osteoporosis    Seasonal allergies    Past Surgical History:  Procedure Laterality Date   BREAST EXCISIONAL BIOPSY Left 1970   benign   CATARACT EXTRACTION, BILATERAL     SKIN CANCER EXCISION     carcinoma (3 surgeries)   TONSILLECTOMY  1948   Family History  Problem Relation Age of Onset   Colon cancer Brother    Colon polyps Sister    Breast cancer Neg Hx    Social History   Socioeconomic History   Marital status: Widowed    Spouse name: Not on file   Number of children: 3   Years of education: Not on file   Highest education level: Not on file  Occupational History   Not on file  Tobacco Use    Smoking status: Never   Smokeless tobacco: Never  Substance and Sexual Activity   Alcohol use: Yes    Alcohol/week: 0.0 standard drinks of alcohol    Comment: rare - glass of wine   Drug use: No   Sexual activity: Never  Other Topics Concern   Not on file  Social History Narrative   Not on file   Social Drivers of Health   Tobacco Use: Low Risk (07/06/2024)   Patient History    Smoking Tobacco Use: Never    Smokeless Tobacco Use: Never    Passive Exposure: Not on file  Financial Resource Strain: Low Risk (03/13/2024)   Received from [facility]   Overall Financial Resource Strain (CARDIA)    How hard is it for you to pay for the very basics like food, housing, medical care, and heating?: Not hard at all  Food Insecurity: No Food Insecurity (03/13/2024)   Received from [facility]   Epic    Within the past 12 months, you worried that your food would run out before you got the money to buy more.: Never true    Within the past 12 months, the food you bought just didn't last and you didn't have money to get more.: Never true  Transportation Needs: No Transportation Needs (03/13/2024)   Received from [facility] - Transportation    Lack of Transportation (Medical):  No    Lack of Transportation (Non-Medical): No  Physical Activity: Not on file  Stress: Not on file  Social Connections: Not on file  Depression (PHQ2-9): Low Risk (06/30/2024)   Depression (PHQ2-9)    PHQ-2 Score: 1  Alcohol Screen: Not on file  Housing: Not on file  Utilities: Not on file  Health Literacy: Not on file     Review of Systems  Constitutional:  Negative for appetite change and unexpected weight change.  HENT:  Negative for congestion and sinus pressure.   Respiratory:  Negative for cough, chest tightness and shortness of breath.   Cardiovascular:  Negative for chest pain, palpitations and leg swelling.  Gastrointestinal:  Negative for abdominal pain, diarrhea, nausea and  vomiting.  Genitourinary:  Negative for difficulty urinating and dysuria.  Musculoskeletal:  Negative for joint swelling and myalgias.  Skin:  Negative for color change and rash.  Neurological:  Negative for dizziness and headaches.  Psychiatric/Behavioral:  Negative for agitation and dysphoric mood.        Objective:     BP (!) 160/84   Pulse 96   Temp 98.1 F (36.7 C) (Oral)   Ht 5' 3 (1.6 m)   Wt 109 lb 6.4 oz (49.6 kg)   SpO2 96%   BMI 19.38 kg/m  Wt Readings from Last 3 Encounters:  06/30/24 109 lb 6.4 oz (49.6 kg)  03/28/24 110 lb 3.2 oz (50 kg)  02/21/24 110 lb (49.9 kg)    Physical Exam Vitals reviewed.  Constitutional:      General: She is not in acute distress.    Appearance: Normal appearance.  HENT:     Head: Normocephalic and atraumatic.     Right Ear: External ear normal.     Left Ear: External ear normal.     Mouth/Throat:     Pharynx: No oropharyngeal exudate or posterior oropharyngeal erythema.  Eyes:     General: No scleral icterus.       Right eye: No discharge.        Left eye: No discharge.     Conjunctiva/sclera: Conjunctivae normal.  Neck:     Thyroid : No thyromegaly.  Cardiovascular:     Rate and Rhythm: Normal rate and regular rhythm.  Pulmonary:     Effort: No respiratory distress.     Breath sounds: Normal breath sounds. No wheezing.  Abdominal:     General: Bowel sounds are normal.     Palpations: Abdomen is soft.     Tenderness: There is no abdominal tenderness.  Musculoskeletal:        General: No swelling or tenderness.     Cervical back: Neck supple. No tenderness.  Lymphadenopathy:     Cervical: No cervical adenopathy.  Skin:    Findings: No erythema or rash.  Neurological:     Mental Status: She is alert.  Psychiatric:        Mood and Affect: Mood normal.        Behavior: Behavior normal.         Outpatient Encounter Medications as of 06/30/2024  Medication Sig   aspirin EC 81 MG tablet Take 81 mg by mouth  daily. (Patient taking differently: Take 81 mg by mouth daily. PT STATED TAKING EVERY OTHER DAY)   busPIRone  (BUSPAR ) 5 MG tablet Take 1 tablet (5 mg total) by mouth daily as needed.   Calcium Carbonate-Vitamin D  600-400 MG-UNIT per tablet Take 1 tablet by mouth daily.    hydrocortisone (CORTEF) 10  MG tablet SWISH AND EXPECTORATE TWO TEASPOONFULS THREE TIMES A DAY.   latanoprost (XALATAN) 0.005 % ophthalmic solution SMARTSIG:In Eye(s)   Multiple Vitamins-Minerals (ICAPS PO) Take by mouth.   mupirocin  ointment (BACTROBAN ) 2 % Apply to affected area twicd a day   Niacin (VITAMIN B-3 PO) Take by mouth.   timolol (BETIMOL) 0.5 % ophthalmic solution Place 1 drop into both eyes 2 (two) times daily.   valACYclovir (VALTREX) 1000 MG tablet Take 2 tablets twice a day for 1 day with each outbreak   amLODipine  (NORVASC ) 2.5 MG tablet Take 1 tablet (2.5 mg total) by mouth daily.   atenolol  (TENORMIN ) 25 MG tablet Take 1 tablet (25 mg total) by mouth daily. (Patient not taking: Reported on 06/30/2024)   lisinopril  (ZESTRIL ) 20 MG tablet Take 1 tablet (20 mg total) by mouth daily. (Patient not taking: Reported on 06/30/2024)   [DISCONTINUED] amLODipine  (NORVASC ) 2.5 MG tablet Take 1 tablet (2.5 mg total) by mouth daily.   No facility-administered encounter medications on file as of 06/30/2024.     Lab Results  Component Value Date   WBC 6.2 06/26/2024   HGB 14.3 06/26/2024   HCT 41.5 06/26/2024   PLT 230.0 06/26/2024   GLUCOSE 111 (H) 06/26/2024   CHOL 185 06/26/2024   TRIG 97.0 06/26/2024   HDL 64.30 06/26/2024   LDLCALC 101 (H) 06/26/2024   ALT 13 06/26/2024   AST 17 06/26/2024   NA 139 06/26/2024   K 4.8 06/26/2024   CL 103 06/26/2024   CREATININE 0.76 06/26/2024   BUN 18 06/26/2024   CO2 30 06/26/2024   TSH 5.61 (H) 06/26/2024   INR 0.9 06/05/2018   HGBA1C 5.5 06/26/2024    MM 3D SCREENING MAMMOGRAM BILATERAL BREAST Result Date: 05/04/2023 CLINICAL DATA:  Screening. EXAM: DIGITAL  SCREENING BILATERAL MAMMOGRAM WITH TOMOSYNTHESIS AND CAD TECHNIQUE: Bilateral screening digital craniocaudal and mediolateral oblique mammograms were obtained. Bilateral screening digital breast tomosynthesis was performed. The images were evaluated with computer-aided detection. COMPARISON:  Previous exam(s). ACR Breast Density Category c: The breasts are heterogeneously dense, which may obscure small masses. FINDINGS: There are no findings suspicious for malignancy. IMPRESSION: No mammographic evidence of malignancy. A result letter of this screening mammogram will be mailed directly to the patient. RECOMMENDATION: Screening mammogram in one year. (Code:SM-B-01Y) BI-RADS CATEGORY  1: Negative. Electronically Signed   By: Corean Salter M.D.   On: 05/04/2023 13:59       Assessment & Plan:  Essential hypertension, benign Assessment & Plan: Blood pressure elevated in office. Reviewed outside checks - ranging 120s/70-80s. Discussed medication. Taking amlodipine  and 1/2 lisinopril . Discussed 20mg  lisinopril . Blood pressures controlled on outside checks. Prefers not to change. Follow pressures. Follow metabolic panel.   Orders: -     Basic metabolic panel with GFR; Future  Hypercholesteremia Assessment & Plan: Have discussed calculated cholesterol risk.  Desires no medication.  Follow lipid panel.  Lab Results  Component Value Date   CHOL 185 06/26/2024   HDL 64.30 06/26/2024   LDLCALC 101 (H) 06/26/2024   TRIG 97.0 06/26/2024   CHOLHDL 3 06/26/2024     Orders: -     TSH; Future -     Hepatic function panel; Future -     Lipid panel; Future  Hyperglycemia Assessment & Plan: Follow met b and A1c.  Lab Results  Component Value Date   HGBA1C 5.5 06/26/2024     Orders: -     Hemoglobin A1c; Future  Elevated TSH Assessment &  Plan: Slight elevation - tsh. Recheck tsh and free T4 in 4-6 weeks.   Orders: -     T4, free; Future -     TSH; Future  Vitamin D   deficiency Assessment & Plan: Check vitamin D  level with next labs.   Orders: -     VITAMIN D  25 Hydroxy (Vit-D Deficiency, Fractures); Future  Stress Assessment & Plan: Increased stress. Discussed. Has good support. Follow.    Osteoporosis without current pathological fracture, unspecified osteoporosis type Assessment & Plan: Have discussed. Has desired no prescription medication. Follow. Calcium, vitamin D .    Gastrointestinal hemorrhage, unspecified gastrointestinal hemorrhage type Assessment & Plan:  Was admitted 03/12/24 - 03/14/24 with GI bleed. See last note for details. GI consulted and recommended colonoscopy. 03/14/24 - findings in the colonoscopy included: hemorrhoids, multiple medium diverticula in the sigmoid colon, narrowing of the signoid colon in association with the diverticular opening, segmental moderate inflammation characterized by erythema and friability and granularity in the descending colon and distal transverse colon which could be consistent with ischemia.  Was referred to GI. Has appt scheduled in 08/2024.  No further bleeding reported. Reviewed pathology - Colonic mucosa with features of ischemic colitis. No evidence of inflammatory bowel disease. No evidence of dysplasia or malignancy. Had ordered zio monitor. Appears has not returned. F/u.  Blood pressure as outlined. Monitor closely.    Other orders -     amLODIPine  Besylate; Take 1 tablet (2.5 mg total) by mouth daily.  Dispense: 90 tablet; Refill: 1     Allena Hamilton, MD "

## 2024-07-06 ENCOUNTER — Telehealth: Payer: Self-pay | Admitting: Internal Medicine

## 2024-07-06 ENCOUNTER — Encounter: Payer: Self-pay | Admitting: Internal Medicine

## 2024-07-06 NOTE — Addendum Note (Signed)
 Addended by: GLENDIA ALLENA RAMAN on: 07/06/2024 06:23 AM   Modules accepted: Orders

## 2024-07-06 NOTE — Assessment & Plan Note (Signed)
Increased stress.  Discussed.  Has good support.  Follow.

## 2024-07-06 NOTE — Assessment & Plan Note (Addendum)
 Slight elevation - tsh. Recheck tsh and free T4 in 4-6 weeks.

## 2024-07-06 NOTE — Assessment & Plan Note (Signed)
 Have discussed. Has desired no prescription medication. Follow. Calcium, vitamin D .

## 2024-07-06 NOTE — Telephone Encounter (Signed)
 Please call Claire Clayton. Last visit, we had discussed wearing a heart monitor - recommended from recent hospitalization. I have not received results. Please confirm if she has received the monitor and has she worn the monitor. If no, is she agreeable? Let me know if any questions.

## 2024-07-06 NOTE — Assessment & Plan Note (Addendum)
"   Was admitted 03/12/24 - 03/14/24 with GI bleed. See last note for details. GI consulted and recommended colonoscopy. 03/14/24 - findings in the colonoscopy included: hemorrhoids, multiple medium diverticula in the sigmoid colon, narrowing of the signoid colon in association with the diverticular opening, segmental moderate inflammation characterized by erythema and friability and granularity in the descending colon and distal transverse colon which could be consistent with ischemia.  Was referred to GI. Has appt scheduled in 08/2024.  No further bleeding reported. Reviewed pathology - Colonic mucosa with features of ischemic colitis. No evidence of inflammatory bowel disease. No evidence of dysplasia or malignancy. Had ordered zio monitor. Appears has not returned. F/u.  Blood pressure as outlined. Monitor closely.  "

## 2024-07-06 NOTE — Assessment & Plan Note (Signed)
 Blood pressure elevated in office. Reviewed outside checks - ranging 120s/70-80s. Discussed medication. Taking amlodipine  and 1/2 lisinopril . Discussed 20mg  lisinopril . Blood pressures controlled on outside checks. Prefers not to change. Follow pressures. Follow metabolic panel.

## 2024-07-06 NOTE — Assessment & Plan Note (Signed)
 Have discussed calculated cholesterol risk.  Desires no medication.  Follow lipid panel.  Lab Results  Component Value Date   CHOL 185 06/26/2024   HDL 64.30 06/26/2024   LDLCALC 101 (H) 06/26/2024   TRIG 97.0 06/26/2024   CHOLHDL 3 06/26/2024

## 2024-07-06 NOTE — Assessment & Plan Note (Signed)
 Follow met b and A1c.  Lab Results  Component Value Date   HGBA1C 5.5 06/26/2024

## 2024-07-06 NOTE — Assessment & Plan Note (Signed)
 Check vitamin D level with next labs.  ?

## 2024-07-08 NOTE — Telephone Encounter (Signed)
 Called and discussed with Ms Schram - regarding the reason for the monitor. Discussed possible ischemic bowel noted on colonoscopy / pathology. She is doing well. No bowel issues. No abdominal pain. No blood in stool. No GI issues. She will keep her appt with GI, but desires no further w/up. GI notified.

## 2024-08-13 ENCOUNTER — Other Ambulatory Visit

## 2024-10-24 ENCOUNTER — Other Ambulatory Visit

## 2024-10-28 ENCOUNTER — Ambulatory Visit: Admitting: Internal Medicine
